# Patient Record
Sex: Male | Born: 1955
Health system: Southern US, Community
[De-identification: ages and names within clinical notes are randomized; demographics above are authoritative.]

## PROBLEM LIST (undated history)

## (undated) DIAGNOSIS — J984 Other disorders of lung: Secondary | ICD-10-CM

## (undated) DIAGNOSIS — E119 Type 2 diabetes mellitus without complications: Secondary | ICD-10-CM

## (undated) DIAGNOSIS — I10 Essential (primary) hypertension: Secondary | ICD-10-CM

## (undated) DIAGNOSIS — E663 Overweight: Secondary | ICD-10-CM

## (undated) DIAGNOSIS — R930 Abnormal findings on diagnostic imaging of skull and head, not elsewhere classified: Secondary | ICD-10-CM

## (undated) DIAGNOSIS — E785 Hyperlipidemia, unspecified: Secondary | ICD-10-CM

## (undated) DIAGNOSIS — L678 Other hair color and hair shaft abnormalities: Secondary | ICD-10-CM

## (undated) DIAGNOSIS — E291 Testicular hypofunction: Secondary | ICD-10-CM

## (undated) DIAGNOSIS — L738 Other specified follicular disorders: Secondary | ICD-10-CM

## (undated) DIAGNOSIS — S46219A Strain of muscle, fascia and tendon of other parts of biceps, unspecified arm, initial encounter: Secondary | ICD-10-CM

## (undated) DIAGNOSIS — F528 Other sexual dysfunction not due to a substance or known physiological condition: Secondary | ICD-10-CM

## (undated) HISTORY — DX: Abnormal findings on diagnostic imaging of skull and head, not elsewhere classified: R93.0

## (undated) HISTORY — DX: Type 2 diabetes mellitus without complications: E11.9

## (undated) HISTORY — DX: Strain of muscle, fascia and tendon of other parts of biceps, unspecified arm, initial encounter: S46.219A

## (undated) HISTORY — DX: Essential (primary) hypertension: I10

## (undated) HISTORY — PX: OTHER SURGICAL HISTORY: SHX169

## (undated) HISTORY — DX: Testicular hypofunction: E29.1

## (undated) HISTORY — DX: Other hair color and hair shaft abnormalities: L67.8

## (undated) HISTORY — DX: Other sexual dysfunction not due to a substance or known physiological condition: F52.8

## (undated) HISTORY — DX: Other specified follicular disorders: L73.8

## (undated) HISTORY — DX: Hyperlipidemia, unspecified: E78.5

## (undated) HISTORY — PX: WISDOM TOOTH EXTRACTION: SHX21

## (undated) HISTORY — DX: Other disorders of lung: J98.4

## (undated) HISTORY — PX: NASAL POLYP SURGERY: SHX186

## (undated) HISTORY — DX: Overweight: E66.3

---

## 1998-10-09 ENCOUNTER — Encounter: Admission: RE | Admit: 1998-10-09 | Discharge: 1999-01-07 | Payer: Self-pay | Admitting: Endocrinology

## 1999-01-19 ENCOUNTER — Encounter: Payer: Self-pay | Admitting: Internal Medicine

## 1999-01-19 ENCOUNTER — Ambulatory Visit (HOSPITAL_COMMUNITY): Admission: RE | Admit: 1999-01-19 | Discharge: 1999-01-19 | Payer: Self-pay | Admitting: Internal Medicine

## 2004-10-13 ENCOUNTER — Ambulatory Visit: Payer: Self-pay | Admitting: Internal Medicine

## 2005-04-12 ENCOUNTER — Ambulatory Visit: Payer: Self-pay | Admitting: Internal Medicine

## 2005-07-01 ENCOUNTER — Ambulatory Visit: Payer: Self-pay | Admitting: Internal Medicine

## 2005-09-23 ENCOUNTER — Ambulatory Visit: Payer: Self-pay

## 2005-10-01 ENCOUNTER — Ambulatory Visit: Payer: Self-pay | Admitting: Internal Medicine

## 2005-10-13 ENCOUNTER — Ambulatory Visit: Payer: Self-pay | Admitting: Internal Medicine

## 2006-01-03 ENCOUNTER — Ambulatory Visit: Payer: Self-pay | Admitting: Internal Medicine

## 2006-01-17 ENCOUNTER — Ambulatory Visit: Payer: Self-pay | Admitting: Internal Medicine

## 2006-02-04 ENCOUNTER — Ambulatory Visit: Payer: Self-pay | Admitting: Internal Medicine

## 2006-03-29 ENCOUNTER — Ambulatory Visit: Payer: Self-pay | Admitting: Internal Medicine

## 2006-04-19 ENCOUNTER — Ambulatory Visit: Payer: Self-pay | Admitting: Internal Medicine

## 2006-12-19 ENCOUNTER — Ambulatory Visit: Payer: Self-pay | Admitting: Internal Medicine

## 2006-12-19 LAB — CONVERTED CEMR LAB: Hgb A1c MFr Bld: 8.6 % — ABNORMAL HIGH (ref 4.6–6.0)

## 2006-12-28 ENCOUNTER — Ambulatory Visit: Payer: Self-pay | Admitting: Internal Medicine

## 2007-03-09 ENCOUNTER — Ambulatory Visit (HOSPITAL_COMMUNITY): Admission: RE | Admit: 2007-03-09 | Discharge: 2007-03-09 | Payer: Self-pay | Admitting: Ophthalmology

## 2007-03-30 ENCOUNTER — Ambulatory Visit: Payer: Self-pay | Admitting: Internal Medicine

## 2007-03-30 LAB — CONVERTED CEMR LAB: Hgb A1c MFr Bld: 8.4 % — ABNORMAL HIGH (ref 4.6–6.0)

## 2007-04-07 ENCOUNTER — Ambulatory Visit: Payer: Self-pay | Admitting: Internal Medicine

## 2007-05-11 ENCOUNTER — Ambulatory Visit: Payer: Self-pay | Admitting: Internal Medicine

## 2007-05-18 ENCOUNTER — Ambulatory Visit: Payer: Self-pay | Admitting: Internal Medicine

## 2007-08-21 ENCOUNTER — Ambulatory Visit: Payer: Self-pay | Admitting: Internal Medicine

## 2007-08-21 LAB — CONVERTED CEMR LAB
ALT: 41 units/L (ref 0–53)
AST: 41 units/L — ABNORMAL HIGH (ref 0–37)
Albumin: 4.1 g/dL (ref 3.5–5.2)
Alkaline Phosphatase: 50 units/L (ref 39–117)
BUN: 12 mg/dL (ref 6–23)
Basophils Absolute: 0 10*3/uL (ref 0.0–0.1)
Basophils Relative: 0.4 % (ref 0.0–1.0)
Bilirubin Urine: NEGATIVE
Bilirubin, Direct: 0.1 mg/dL (ref 0.0–0.3)
CO2: 29 meq/L (ref 19–32)
Calcium: 9.6 mg/dL (ref 8.4–10.5)
Chloride: 107 meq/L (ref 96–112)
Cholesterol: 135 mg/dL (ref 0–200)
Creatinine, Ser: 0.8 mg/dL (ref 0.4–1.5)
Eosinophils Absolute: 0.2 10*3/uL (ref 0.0–0.6)
Eosinophils Relative: 1.9 % (ref 0.0–5.0)
GFR calc Af Amer: 132 mL/min
GFR calc non Af Amer: 109 mL/min
Glucose, Bld: 95 mg/dL (ref 70–99)
HCT: 37.8 % — ABNORMAL LOW (ref 39.0–52.0)
HDL: 37.1 mg/dL — ABNORMAL LOW (ref >39.0)
Hemoglobin, Urine: NEGATIVE
Hemoglobin: 12 g/dL — ABNORMAL LOW (ref 13.0–17.0)
Hgb A1c MFr Bld: 6.9 % — ABNORMAL HIGH (ref 4.6–6.0)
Ketones, ur: NEGATIVE mg/dL
LDL Cholesterol: 91 mg/dL (ref 0–99)
Leukocytes, UA: NEGATIVE
Lymphocytes Relative: 39 % (ref 12.0–46.0)
MCHC: 31.7 g/dL (ref 30.0–36.0)
MCV: 74 fL — ABNORMAL LOW (ref 78.0–100.0)
Monocytes Absolute: 0.6 10*3/uL (ref 0.2–0.7)
Monocytes Relative: 7 % (ref 3.0–11.0)
Neutro Abs: 4 10*3/uL (ref 1.4–7.7)
Neutrophils Relative %: 51.7 % (ref 43.0–77.0)
Nitrite: NEGATIVE
PSA: 0.36 ng/mL (ref 0.10–4.00)
Platelets: 167 10*3/uL (ref 150–400)
Potassium: 4.1 meq/L (ref 3.5–5.1)
RBC: 5.11 M/uL (ref 4.22–5.81)
RDW: 14.6 % (ref 11.5–14.6)
Sodium: 141 meq/L (ref 135–145)
Specific Gravity, Urine: 1.015 (ref 1.000–1.03)
Total Bilirubin: 0.7 mg/dL (ref 0.3–1.2)
Total CHOL/HDL Ratio: 3.6
Total Protein, Urine: NEGATIVE mg/dL
Total Protein: 7.4 g/dL (ref 6.0–8.3)
Triglycerides: 34 mg/dL (ref 0–149)
Urine Glucose: NEGATIVE mg/dL
Urobilinogen, UA: 0.2 (ref 0.0–1.0)
VLDL: 7 mg/dL (ref 0–40)
WBC: 7.9 10*3/uL (ref 4.5–10.5)
pH: 6 (ref 5.0–8.0)

## 2007-08-29 ENCOUNTER — Ambulatory Visit: Payer: Self-pay | Admitting: Internal Medicine

## 2007-08-29 ENCOUNTER — Encounter: Payer: Self-pay | Admitting: Internal Medicine

## 2007-08-29 DIAGNOSIS — E1169 Type 2 diabetes mellitus with other specified complication: Secondary | ICD-10-CM | POA: Insufficient documentation

## 2007-08-29 DIAGNOSIS — I10 Essential (primary) hypertension: Secondary | ICD-10-CM | POA: Insufficient documentation

## 2007-08-29 DIAGNOSIS — L678 Other hair color and hair shaft abnormalities: Secondary | ICD-10-CM | POA: Insufficient documentation

## 2007-08-29 DIAGNOSIS — E119 Type 2 diabetes mellitus without complications: Secondary | ICD-10-CM | POA: Insufficient documentation

## 2007-08-29 DIAGNOSIS — N521 Erectile dysfunction due to diseases classified elsewhere: Secondary | ICD-10-CM

## 2007-08-29 DIAGNOSIS — L738 Other specified follicular disorders: Secondary | ICD-10-CM | POA: Insufficient documentation

## 2007-09-15 LAB — HM COLONOSCOPY: HM Colonoscopy: NORMAL

## 2007-10-10 ENCOUNTER — Ambulatory Visit: Payer: Self-pay | Admitting: Gastroenterology

## 2007-10-18 ENCOUNTER — Ambulatory Visit: Payer: Self-pay | Admitting: Gastroenterology

## 2007-11-27 ENCOUNTER — Encounter: Payer: Self-pay | Admitting: Internal Medicine

## 2007-11-30 DIAGNOSIS — R930 Abnormal findings on diagnostic imaging of skull and head, not elsewhere classified: Secondary | ICD-10-CM

## 2007-11-30 HISTORY — DX: Abnormal findings on diagnostic imaging of skull and head, not elsewhere classified: R93.0

## 2007-12-07 ENCOUNTER — Ambulatory Visit: Payer: Self-pay | Admitting: Internal Medicine

## 2007-12-11 ENCOUNTER — Encounter: Payer: Self-pay | Admitting: Internal Medicine

## 2008-07-03 ENCOUNTER — Telehealth: Payer: Self-pay | Admitting: Internal Medicine

## 2008-07-04 DIAGNOSIS — R930 Abnormal findings on diagnostic imaging of skull and head, not elsewhere classified: Secondary | ICD-10-CM | POA: Insufficient documentation

## 2008-07-05 ENCOUNTER — Telehealth: Payer: Self-pay | Admitting: Internal Medicine

## 2008-07-22 ENCOUNTER — Ambulatory Visit: Payer: Self-pay | Admitting: Internal Medicine

## 2008-07-23 ENCOUNTER — Encounter: Payer: Self-pay | Admitting: Internal Medicine

## 2008-08-23 ENCOUNTER — Ambulatory Visit: Payer: Self-pay | Admitting: Internal Medicine

## 2008-08-23 LAB — CONVERTED CEMR LAB
ALT: 57 units/L — ABNORMAL HIGH (ref 0–53)
AST: 43 units/L — ABNORMAL HIGH (ref 0–37)
Albumin: 4.1 g/dL (ref 3.5–5.2)
Alkaline Phosphatase: 50 units/L (ref 39–117)
BUN: 11 mg/dL (ref 6–23)
Basophils Absolute: 0 10*3/uL (ref 0.0–0.1)
Basophils Relative: 0.2 % (ref 0.0–3.0)
Bilirubin Urine: NEGATIVE
Bilirubin, Direct: 0.2 mg/dL (ref 0.0–0.3)
CO2: 28 meq/L (ref 19–32)
Calcium: 9.4 mg/dL (ref 8.4–10.5)
Chloride: 109 meq/L (ref 96–112)
Cholesterol: 140 mg/dL (ref 0–200)
Creatinine, Ser: 1 mg/dL (ref 0.4–1.5)
Eosinophils Absolute: 0.2 10*3/uL (ref 0.0–0.7)
Eosinophils Relative: 2.7 % (ref 0.0–5.0)
GFR calc Af Amer: 101 mL/min
GFR calc non Af Amer: 84 mL/min
Glucose, Bld: 114 mg/dL — ABNORMAL HIGH (ref 70–99)
HCT: 39.1 % (ref 39.0–52.0)
HDL: 33.4 mg/dL — ABNORMAL LOW (ref >39.0)
Hemoglobin, Urine: NEGATIVE
Hemoglobin: 12.5 g/dL — ABNORMAL LOW (ref 13.0–17.0)
Ketones, ur: NEGATIVE mg/dL
LDL Cholesterol: 92 mg/dL (ref 0–99)
Leukocytes, UA: NEGATIVE
Lymphocytes Relative: 39.4 % (ref 12.0–46.0)
MCHC: 31.9 g/dL (ref 30.0–36.0)
MCV: 74.5 fL — ABNORMAL LOW (ref 78.0–100.0)
Monocytes Absolute: 0.6 10*3/uL (ref 0.1–1.0)
Monocytes Relative: 7.4 % (ref 3.0–12.0)
Neutro Abs: 4 10*3/uL (ref 1.4–7.7)
Neutrophils Relative %: 50.3 % (ref 43.0–77.0)
Nitrite: NEGATIVE
PSA: 0.32 ng/mL (ref 0.10–4.00)
Platelets: 194 10*3/uL (ref 150–400)
Potassium: 4.2 meq/L (ref 3.5–5.1)
RBC: 5.25 M/uL (ref 4.22–5.81)
RDW: 14.5 % (ref 11.5–14.6)
Sodium: 142 meq/L (ref 135–145)
Specific Gravity, Urine: >=1.03 (ref 1.000–1.03)
TSH: 1.48 microintl units/mL (ref 0.35–5.50)
Total Bilirubin: 0.7 mg/dL (ref 0.3–1.2)
Total CHOL/HDL Ratio: 4.2
Total Protein, Urine: NEGATIVE mg/dL
Total Protein: 7.4 g/dL (ref 6.0–8.3)
Triglycerides: 72 mg/dL (ref 0–149)
Urine Glucose: NEGATIVE mg/dL
Urobilinogen, UA: 0.2 (ref 0.0–1.0)
VLDL: 14 mg/dL (ref 0–40)
WBC: 7.9 10*3/uL (ref 4.5–10.5)
pH: 5.5 (ref 5.0–8.0)

## 2008-09-26 ENCOUNTER — Ambulatory Visit: Payer: Self-pay | Admitting: Internal Medicine

## 2008-09-26 DIAGNOSIS — E66811 Obesity, class 1: Secondary | ICD-10-CM | POA: Insufficient documentation

## 2008-09-26 DIAGNOSIS — E669 Obesity, unspecified: Secondary | ICD-10-CM | POA: Insufficient documentation

## 2008-09-26 LAB — CONVERTED CEMR LAB: Hgb A1c MFr Bld: 7.5 % — ABNORMAL HIGH (ref 4.6–6.0)

## 2008-09-29 ENCOUNTER — Encounter: Payer: Self-pay | Admitting: Internal Medicine

## 2008-11-29 HISTORY — PX: COLONOSCOPY: SHX174

## 2008-12-19 ENCOUNTER — Ambulatory Visit: Payer: Self-pay | Admitting: Gastroenterology

## 2008-12-27 ENCOUNTER — Encounter: Payer: Self-pay | Admitting: Internal Medicine

## 2008-12-27 ENCOUNTER — Ambulatory Visit: Payer: Self-pay | Admitting: Gastroenterology

## 2008-12-27 ENCOUNTER — Encounter: Payer: Self-pay | Admitting: Gastroenterology

## 2008-12-31 ENCOUNTER — Encounter: Payer: Self-pay | Admitting: Gastroenterology

## 2009-01-06 ENCOUNTER — Encounter: Payer: Self-pay | Admitting: Internal Medicine

## 2009-02-05 ENCOUNTER — Telehealth: Payer: Self-pay | Admitting: Internal Medicine

## 2009-02-05 ENCOUNTER — Encounter: Payer: Self-pay | Admitting: Internal Medicine

## 2009-02-07 ENCOUNTER — Telehealth (INDEPENDENT_AMBULATORY_CARE_PROVIDER_SITE_OTHER): Payer: Self-pay | Admitting: *Deleted

## 2009-02-10 ENCOUNTER — Ambulatory Visit: Payer: Self-pay | Admitting: Internal Medicine

## 2009-02-10 DIAGNOSIS — J984 Other disorders of lung: Secondary | ICD-10-CM | POA: Insufficient documentation

## 2009-02-10 HISTORY — DX: Other disorders of lung: J98.4

## 2009-02-14 ENCOUNTER — Encounter: Payer: Self-pay | Admitting: Internal Medicine

## 2009-02-14 ENCOUNTER — Ambulatory Visit: Payer: Self-pay | Admitting: Internal Medicine

## 2009-02-14 DIAGNOSIS — S6710XA Crushing injury of unspecified finger(s), initial encounter: Secondary | ICD-10-CM | POA: Insufficient documentation

## 2009-02-14 DIAGNOSIS — S6000XA Contusion of unspecified finger without damage to nail, initial encounter: Secondary | ICD-10-CM | POA: Insufficient documentation

## 2009-02-17 ENCOUNTER — Ambulatory Visit: Payer: Self-pay | Admitting: Cardiology

## 2009-03-04 ENCOUNTER — Telehealth: Payer: Self-pay | Admitting: Internal Medicine

## 2009-03-11 ENCOUNTER — Telehealth: Payer: Self-pay | Admitting: Internal Medicine

## 2009-05-09 ENCOUNTER — Telehealth: Payer: Self-pay | Admitting: Internal Medicine

## 2009-05-12 ENCOUNTER — Ambulatory Visit: Payer: Self-pay | Admitting: Internal Medicine

## 2009-05-13 ENCOUNTER — Telehealth: Payer: Self-pay | Admitting: Internal Medicine

## 2009-05-13 LAB — CONVERTED CEMR LAB
BUN: 12 mg/dL (ref 6–23)
CO2: 27 meq/L (ref 19–32)
Calcium: 9.3 mg/dL (ref 8.4–10.5)
Chloride: 103 meq/L (ref 96–112)
Creatinine, Ser: 1 mg/dL (ref 0.4–1.5)
GFR calc non Af Amer: 100.86 mL/min (ref >60)
Glucose, Bld: 187 mg/dL — ABNORMAL HIGH (ref 70–99)
Hgb A1c MFr Bld: 8.9 % — ABNORMAL HIGH (ref 4.6–6.5)
Potassium: 4 meq/L (ref 3.5–5.1)
Sodium: 137 meq/L (ref 135–145)

## 2009-06-18 ENCOUNTER — Telehealth: Payer: Self-pay | Admitting: Internal Medicine

## 2009-07-24 ENCOUNTER — Ambulatory Visit: Payer: Self-pay | Admitting: Internal Medicine

## 2009-07-24 DIAGNOSIS — R5383 Other fatigue: Secondary | ICD-10-CM

## 2009-07-24 DIAGNOSIS — R5381 Other malaise: Secondary | ICD-10-CM | POA: Insufficient documentation

## 2009-07-24 LAB — CONVERTED CEMR LAB: Blood Glucose, Fingerstick: 202

## 2009-07-25 ENCOUNTER — Encounter (INDEPENDENT_AMBULATORY_CARE_PROVIDER_SITE_OTHER): Payer: Self-pay | Admitting: *Deleted

## 2009-07-25 LAB — CONVERTED CEMR LAB
BUN: 12 mg/dL (ref 6–23)
Basophils Absolute: 0 10*3/uL (ref 0.0–0.1)
Basophils Relative: 0 % (ref 0.0–3.0)
Bilirubin Urine: NEGATIVE
CO2: 31 meq/L (ref 19–32)
Calcium: 9.4 mg/dL (ref 8.4–10.5)
Chloride: 107 meq/L (ref 96–112)
Creatinine, Ser: 0.9 mg/dL (ref 0.4–1.5)
Eosinophils Absolute: 0.2 10*3/uL (ref 0.0–0.7)
Eosinophils Relative: 3.3 % (ref 0.0–5.0)
GFR calc non Af Amer: 113.81 mL/min (ref >60)
Glucose, Bld: 141 mg/dL — ABNORMAL HIGH (ref 70–99)
HCT: 37.9 % — ABNORMAL LOW (ref 39.0–52.0)
Hemoglobin, Urine: NEGATIVE
Hemoglobin: 12.5 g/dL — ABNORMAL LOW (ref 13.0–17.0)
Hgb A1c MFr Bld: 8.5 % — ABNORMAL HIGH (ref 4.6–6.5)
Ketones, ur: NEGATIVE mg/dL
Leukocytes, UA: NEGATIVE
Lymphocytes Relative: 38.7 % (ref 12.0–46.0)
Lymphs Abs: 2.7 10*3/uL (ref 0.7–4.0)
MCHC: 32.9 g/dL (ref 30.0–36.0)
MCV: 74.4 fL — ABNORMAL LOW (ref 78.0–100.0)
Monocytes Absolute: 0.6 10*3/uL (ref 0.1–1.0)
Monocytes Relative: 8.4 % (ref 3.0–12.0)
Neutro Abs: 3.6 10*3/uL (ref 1.4–7.7)
Neutrophils Relative %: 49.6 % (ref 43.0–77.0)
Nitrite: NEGATIVE
Platelets: 167 10*3/uL (ref 150.0–400.0)
Potassium: 4.4 meq/L (ref 3.5–5.1)
RBC: 5.09 M/uL (ref 4.22–5.81)
RDW: 14.1 % (ref 11.5–14.6)
Sodium: 142 meq/L (ref 135–145)
Specific Gravity, Urine: 1.025 (ref 1.000–1.030)
TSH: 1.17 microintl units/mL (ref 0.35–5.50)
Total Protein, Urine: NEGATIVE mg/dL
Urine Glucose: NEGATIVE mg/dL
Urobilinogen, UA: 0.2 (ref 0.0–1.0)
WBC: 7.1 10*3/uL (ref 4.5–10.5)
pH: 6 (ref 5.0–8.0)

## 2009-09-23 ENCOUNTER — Ambulatory Visit: Payer: Self-pay | Admitting: Internal Medicine

## 2009-09-23 LAB — CONVERTED CEMR LAB
ALT: 43 units/L (ref 0–53)
AST: 35 units/L (ref 0–37)
Albumin: 4 g/dL (ref 3.5–5.2)
Alkaline Phosphatase: 49 units/L (ref 39–117)
BUN: 12 mg/dL (ref 6–23)
Basophils Absolute: 0 10*3/uL (ref 0.0–0.1)
Basophils Relative: 0.3 % (ref 0.0–3.0)
Bilirubin Urine: NEGATIVE
Bilirubin, Direct: 0.1 mg/dL (ref 0.0–0.3)
CO2: 28 meq/L (ref 19–32)
Calcium: 9.1 mg/dL (ref 8.4–10.5)
Chloride: 106 meq/L (ref 96–112)
Cholesterol: 138 mg/dL (ref 0–200)
Creatinine, Ser: 0.8 mg/dL (ref 0.4–1.5)
Eosinophils Absolute: 0.2 10*3/uL (ref 0.0–0.7)
Eosinophils Relative: 3.8 % (ref 0.0–5.0)
GFR calc non Af Amer: 130.3 mL/min (ref >60)
Glucose, Bld: 144 mg/dL — ABNORMAL HIGH (ref 70–99)
HCT: 39.8 % (ref 39.0–52.0)
HDL: 32.5 mg/dL — ABNORMAL LOW (ref >39.00)
Hemoglobin, Urine: NEGATIVE
Hemoglobin: 12.9 g/dL — ABNORMAL LOW (ref 13.0–17.0)
Ketones, ur: NEGATIVE mg/dL
LDL Cholesterol: 96 mg/dL (ref 0–99)
Leukocytes, UA: NEGATIVE
Lymphocytes Relative: 47.1 % — ABNORMAL HIGH (ref 12.0–46.0)
Lymphs Abs: 3 10*3/uL (ref 0.7–4.0)
MCHC: 32.5 g/dL (ref 30.0–36.0)
MCV: 76.5 fL — ABNORMAL LOW (ref 78.0–100.0)
Monocytes Absolute: 0.5 10*3/uL (ref 0.1–1.0)
Monocytes Relative: 8.4 % (ref 3.0–12.0)
Neutro Abs: 2.5 10*3/uL (ref 1.4–7.7)
Neutrophils Relative %: 40.4 % — ABNORMAL LOW (ref 43.0–77.0)
Nitrite: NEGATIVE
PSA: 0.39 ng/mL (ref 0.10–4.00)
Platelets: 162 10*3/uL (ref 150.0–400.0)
Potassium: 4.3 meq/L (ref 3.5–5.1)
RBC: 5.2 M/uL (ref 4.22–5.81)
RDW: 13.8 % (ref 11.5–14.6)
Sodium: 140 meq/L (ref 135–145)
Specific Gravity, Urine: >=1.03 (ref 1.000–1.030)
TSH: 1.54 microintl units/mL (ref 0.35–5.50)
Total Bilirubin: 0.5 mg/dL (ref 0.3–1.2)
Total CHOL/HDL Ratio: 4
Total Protein, Urine: NEGATIVE mg/dL
Total Protein: 7.4 g/dL (ref 6.0–8.3)
Triglycerides: 50 mg/dL (ref 0.0–149.0)
Urine Glucose: NEGATIVE mg/dL
Urobilinogen, UA: 0.2 (ref 0.0–1.0)
VLDL: 10 mg/dL (ref 0.0–40.0)
WBC: 6.2 10*3/uL (ref 4.5–10.5)
pH: 5.5 (ref 5.0–8.0)

## 2009-09-29 ENCOUNTER — Ambulatory Visit: Payer: Self-pay | Admitting: Internal Medicine

## 2009-09-29 LAB — HM DIABETES FOOT EXAM

## 2009-12-16 ENCOUNTER — Ambulatory Visit: Payer: Self-pay | Admitting: Internal Medicine

## 2009-12-16 LAB — CONVERTED CEMR LAB: Hgb A1c MFr Bld: 8.3 % — ABNORMAL HIGH (ref 4.6–6.5)

## 2009-12-18 ENCOUNTER — Telehealth (INDEPENDENT_AMBULATORY_CARE_PROVIDER_SITE_OTHER): Payer: Self-pay | Admitting: *Deleted

## 2009-12-24 ENCOUNTER — Ambulatory Visit: Payer: Self-pay | Admitting: Internal Medicine

## 2010-01-26 ENCOUNTER — Telehealth: Payer: Self-pay | Admitting: Internal Medicine

## 2010-04-29 ENCOUNTER — Ambulatory Visit: Payer: Self-pay | Admitting: Internal Medicine

## 2010-05-08 ENCOUNTER — Telehealth: Payer: Self-pay | Admitting: Internal Medicine

## 2010-05-08 LAB — CONVERTED CEMR LAB: Hgb A1c MFr Bld: 6.7 % — ABNORMAL HIGH (ref 4.6–6.5)

## 2010-08-10 ENCOUNTER — Telehealth: Payer: Self-pay | Admitting: Internal Medicine

## 2010-08-17 ENCOUNTER — Ambulatory Visit: Payer: Self-pay | Admitting: Internal Medicine

## 2010-08-17 LAB — CONVERTED CEMR LAB: Hgb A1c MFr Bld: 7.2 % — ABNORMAL HIGH (ref 4.6–6.5)

## 2010-08-20 ENCOUNTER — Telehealth (INDEPENDENT_AMBULATORY_CARE_PROVIDER_SITE_OTHER): Payer: Self-pay | Admitting: *Deleted

## 2010-08-24 ENCOUNTER — Telehealth: Payer: Self-pay | Admitting: Internal Medicine

## 2010-10-09 ENCOUNTER — Ambulatory Visit: Payer: Self-pay | Admitting: Internal Medicine

## 2010-10-09 ENCOUNTER — Encounter: Payer: Self-pay | Admitting: Internal Medicine

## 2010-12-26 ENCOUNTER — Ambulatory Visit
Admission: RE | Admit: 2010-12-26 | Discharge: 2010-12-26 | Payer: Self-pay | Source: Home / Self Care | Attending: Family Medicine | Admitting: Family Medicine

## 2010-12-26 LAB — CONVERTED CEMR LAB
Bilirubin Urine: NEGATIVE
Blood in Urine, dipstick: NEGATIVE
Glucose, Urine, Semiquant: NEGATIVE
Ketones, urine, test strip: NEGATIVE
Nitrite: NEGATIVE
Protein, U semiquant: NEGATIVE
Specific Gravity, Urine: 1.01
Urobilinogen, UA: NEGATIVE
WBC Urine, dipstick: NEGATIVE
pH: 5

## 2010-12-29 ENCOUNTER — Ambulatory Visit
Admission: RE | Admit: 2010-12-29 | Discharge: 2010-12-29 | Payer: Self-pay | Source: Home / Self Care | Attending: Internal Medicine | Admitting: Internal Medicine

## 2010-12-29 ENCOUNTER — Telehealth: Payer: Self-pay | Admitting: Internal Medicine

## 2010-12-29 ENCOUNTER — Encounter: Payer: Self-pay | Admitting: Internal Medicine

## 2010-12-29 ENCOUNTER — Other Ambulatory Visit: Payer: Self-pay | Admitting: Internal Medicine

## 2010-12-29 LAB — HEMOGLOBIN A1C: Hgb A1c MFr Bld: 7.6 % — ABNORMAL HIGH (ref 4.6–6.5)

## 2010-12-29 LAB — TESTOSTERONE: Testosterone: 143.89 ng/dL — ABNORMAL LOW (ref 350.00–890.00)

## 2010-12-31 ENCOUNTER — Telehealth: Payer: Self-pay | Admitting: Internal Medicine

## 2010-12-31 NOTE — Assessment & Plan Note (Signed)
Summary: test results per pt/$50/cd   Vital Signs:  Patient profile:   55 year old male Height:      75 inches Weight:      285 pounds BMI:     35.75 Temp:     97.9 degrees F oral Pulse rate:   76 / minute BP sitting:   122 / 84  (left arm) Cuff size:   large  Vitals Entered By: Zackery Barefoot CMA (February 10, 2009 2:20 PM) CC: follow up on outside CT scan   Primary Care Provider:  [facility]ins  CC:  follow up on outside CT scan.  History of Present Illness: Patient has been participating in a research program looking at coronary disease in diabetics. As part of his evaluation he had a cardiac CT which reveals a suspicious lesion in the RUL. 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 He is asymptomatic without weight loss, cough, shortness of breath, night sweats or any other systemic symptoms.  Current Medications (verified): 1)  Glucophage 1000 Mg  Tabs (Metformin Hcl) .... Two Times A Day 2)  Enalapril Maleate 10 Mg  Tabs (Enalapril Maleate) .... Once Daily 3)  Duac Cs 1-5 %  Kit (Clindamycin-Benzoyl Per-Cleans) .... Apply Two Times A Day As Needed 4)  Testim 1 %  Gel (Testosterone) .... Apply 5 Grams Once Daily As Needed  Allergies (verified): No Known Drug Allergies  Past History:  Past Medical History:    UCD    pseudofolliculitis barbae    biceps tendon rupture    Diabetes mellitus, type II    Hypertension     (08/29/2007)  Past Surgical History:    nasal polypectomy (08/29/2007)  Family History:    father- deceased @38 : Kidney failure    mother- living 43: s/p THR, TKR, DM HTN,    negative -CAD; prostate or colon cancer;    Brother - pancreatic mass. (09/26/2008)  Risk Factors:    Smoking Status: quit (08/29/2007)    Packs/Day: N/A    Cigars/wk: N/A    Pipe Use/wk: N/A    Cans of tobacco/wk: N/A    Passive Smoke Exposure: N/A  Review of Systems  The patient denies anorexia,  fever, weight loss, hoarseness, dyspnea on exertion, prolonged cough, and hemoptysis.    Physical Exam  General:  Heavy set large framed AA male in NAD Lungs:  Normal respiratory effort, chest expands symmetrically. Lungs are clear to auscultation, no crackles or wheezes. Heart:  normal rate and regular rhythm.     Impression & Recommendations:  Problem # 1:  PULMONARY NODULE, RIGHT UPPER LOBE (ICD-518.89) CT report from [facility] with a 1.5 x 1.2 x 0.9 cm nodule in the RUL  Plan: dedicated CT chest with contrast.  Additional incidental finding was a hypodense lesion in the Sacral spine -  the patient with no symptoms. Will consider imaging if CT chest is normal.  Complete Medication List: 1)  Glucophage 1000 Mg Tabs (Metformin hcl) .... Two times a day 2)  Enalapril Maleate 10 Mg Tabs (Enalapril maleate) .... Once daily 3)  Duac Cs 1-5 % Kit (Clindamycin-benzoyl per-cleans) .... Apply two times a day as needed 4)  Testim 1 % Gel (Testosterone) .... Apply 5 grams once daily as needed  Other Orders: Radiology Referral (Radiology)

## 2010-12-31 NOTE — Progress Notes (Signed)
Summary: Fingernail  Phone Note Call from Patient   Summary of Call: Pt was seen by Dr Yetta Barre for infected fingernail. Pt says that 1/2 of the infected nail has began to fall off. Per office notes, Dr did not say nail was infected, only injured. Patient is requesting apt monday with someone to have nail removed. Monday is pt's only day off. Is this something we can do in office or does pt need referral.  Initial call taken by: Lamar Sprinkles,  May 09, 2009 11:58 AM  Follow-up for Phone Call        I can do itin the office.work him where we can Follow-up by: Jacques Navy MD,  May 09, 2009 4:17 PM  Additional Follow-up for Phone Call Additional follow up Details #1::        Pt scheduled for wk in apt monday 11:30 Additional Follow-up by: Lamar Sprinkles,  May 09, 2009 6:15 PM

## 2010-12-31 NOTE — Progress Notes (Signed)
Summary: results  Phone Note Outgoing Call   Reason for Call: Discuss lab or test results Summary of Call: Please call patient: CT chest without suspicious nodule or any worrisome findings.  thanks Initial call taken by: Jacques Navy MD,  March 04, 2009 7:46 AM  Follow-up for Phone Call        Patient lmovm wating to know test results Follow-up by: Rock Nephew CMA,  March 04, 2009 1:56 PM  Additional Follow-up for Phone Call Additional follow up Details #1::        Pt advised of above, also asked if okay to resume Metformin due to the dye (02/17/2009), advised pt same was okay. Pt asked if MD would be sending out a letter, advised the pt that no letter is availabe and if he wanted one, pt stated no. Additional Follow-up by: Zackery Barefoot CMA,  March 06, 2009 10:17 AM

## 2010-12-31 NOTE — Progress Notes (Signed)
Summary: labs  Phone Note Outgoing Call   Reason for Call: Discuss lab or test results Summary of Call: please call patinet: A1C 7.2%  Good work. Initial call taken by: Jacques Navy MD,  August 24, 2010 8:55 AM  Follow-up for Phone Call        Notified pt with results. pt also states pharmacy never recived testoterone rx. Per chart rx sent in 08/20/10 will resend to [facility] Follow-up by: Orlan Leavens RMA,  August 24, 2010 11:50 AM    Prescriptions: TESTIM 1 %  GEL (TESTOSTERONE) APPLY 5 GRAMS once daily as needed  #1 month supp x 0   Entered by:   Orlan Leavens RMA   Authorized by:   Jacques Navy MD   Signed by:   Orlan Leavens RMA on 08/24/2010   Method used:   Faxed to ...       [facility] [id number] (retail)       [address] [place], [place]  [address]       Ph: [phone]       Fax: [phone]   RxID:   [id number]

## 2010-12-31 NOTE — Progress Notes (Signed)
Summary: RESULTS  Phone Note Call from Patient Call back at [phone]   Summary of Call: Patient is requesting results of labs.  Initial call taken by: Lamar Sprinkles, CMA,  May 08, 2010 5:09 PM  Follow-up for Phone Call        called patient with results Follow-up by: Jacques Navy MD,  May 08, 2010 6:54 PM

## 2010-12-31 NOTE — Letter (Signed)
Summary: Out of Work  [facility]  [address] [place], [place] [address]   Phone: [phone]  Fax: [phone]    February 14, 2009   Employee:  [name]    To Whom It May Concern:   For Medical reasons, please excuse the above named employee from work for the following dates:  Start:   February 14, 2009  End:   February 17, 2009  If you need additional information, please feel free to contact our office.         Sincerely,    Etta Grandchild MD

## 2010-12-31 NOTE — Assessment & Plan Note (Signed)
Summary: finger injury SD   Vital Signs:  Patient profile:   55 year old male Height:      75 inches Weight:      284 pounds Temp:     97.7 degrees F Pulse rate:   60 / minute Pulse rhythm:   regular BP sitting:   118 / 74  (left arm) Cuff size:   large  Vitals Entered By: Rock Nephew CMA (February 14, 2009 1:20 PM)   Primary Care Provider:  [facility]   History of Present Illness: I am seeing this pt. for the first time today. He complains of crusing his left thumb in a car door one day PTA. He has gotten pain relief with meds he had  at home. There is a very small collection of blood under the nail proximally but it does not feel painful or swollen today. He wants to make sure it is okay and he needs a note for missing work.  Current Medications (verified): 1)  Glucophage 1000 Mg  Tabs (Metformin Hcl) .... Two Times A Day 2)  Enalapril Maleate 10 Mg  Tabs (Enalapril Maleate) .... Once Daily 3)  Duac Cs 1-5 %  Kit (Clindamycin-Benzoyl Per-Cleans) .... Apply Two Times A Day As Needed 4)  Testim 1 %  Gel (Testosterone) .... Apply 5 Grams Once Daily As Needed  Allergies (verified): No Known Drug Allergies  Past History:  Past Medical History:    Reviewed history from 08/29/2007 and no changes required:    UCD    pseudofolliculitis barbae    biceps tendon rupture    Diabetes mellitus, type II    Hypertension  Past Surgical History:    Reviewed history from 08/29/2007 and no changes required:    nasal polypectomy  Family History:    Reviewed history from 09/26/2008 and no changes required:       father- deceased @38 : Kidney failure       mother- living 42: s/p THR, TKR, DM HTN,       negative -CAD; prostate or colon cancer;       Brother - pancreatic mass.  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       married 7 yrs, single  7 years, married 13 years (1995)       2 daughters-  '80, '50       2 grandchildren - '99, '06  Physical Exam  General:  alert, well-developed, well-nourished, well-hydrated, and overweight-appearing.   Neck:  supple, full ROM, and no masses.   Lungs:  Normal respiratory effort, chest expands symmetrically. Lungs are clear to auscultation, no crackles or wheezes. Heart:  Normal rate and regular rhythm. S1 and S2 normal without gallop, murmur, click, rub or other extra sounds. Abdomen:  soft, non-tender, and normal bowel sounds.   Msk:  left thumb appears normal with the exception of a very small, faint subungal hematoma proxiamlly. The fingernail is intact and there is no swelling, ttp, wounds, and there is good cap. refill distally. Pulses:  R and L carotid,radial,femoral,dorsalis pedis and posterior tibial pulses are full and equal bilaterally Skin:  turgor normal and color normal.   Psych:  Cognition and judgment appear intact. Alert and cooperative with normal attention span and concentration. No apparent delusions, illusions, hallucinations   Impression & Recommendations:  Problem # 1:  SUBUNGUAL HEMATOMA (ICD-923.3) Assessment New  Problem #  2:  CRUSHING INJURY OF FINGER (ICD-927.3) Assessment: New  Orders: T-Finger(s) (73140TC)  Complete Medication List: 1)  Glucophage 1000 Mg Tabs (Metformin hcl) .... Two times a day 2)  Enalapril Maleate 10 Mg Tabs (Enalapril maleate) .... Once daily 3)  Duac Cs 1-5 % Kit (Clindamycin-benzoyl per-cleans) .... Apply two times a day as needed 4)  Testim 1 % Gel (Testosterone) .... Apply 5 grams once daily as needed  Other Orders: Tdap => 83yrs IM (04540) Admin 1st Vaccine (98119)  Patient Instructions: 1)  rest, ice and elevate the left thumb 2)  Please schedule a follow-up appointment in 2 weeks. 3)  Take 650-1000mg  of Tylenol every 4-6 hours as needed for relief of pain or comfort of fever AVOID taking more than 4000mg   in a 24 hour period (can cause liver damage in higher doses). 4)   Take 400-600mg  of Ibuprofen (Advil, Motrin) with food every 4-6 hours as needed for relief of pain or comfort of fever.   Tetanus/Td Vaccine    Vaccine Type: Tdap    Site: left deltoid    Mfr: GlaxoSmithKline    Dose: 0.5 ml    Route: IM    Given by: Rock Nephew CMA    Exp. Date: 01/22/2011    Lot #: [id number]    VIS given: 10/17/07 version given February 14, 2009.

## 2010-12-31 NOTE — Assessment & Plan Note (Signed)
Summary: PHYSICAL--$50-STC   Vital Signs:  Patient Profile:   55 Years Old Male Height:     75 inches Weight:      290 pounds BMI:     36.38 Temp:     98.8 degrees F oral Pulse rate:   60 / minute BP sitting:   140 / 80  (left arm) Cuff size:   large  Vitals Entered By: Zackery Barefoot CMA (September 26, 2008 2:34 PM)                 PCP:  Marlene Beidler  Chief Complaint:  CPX.  History of Present Illness: Presents today for annual physical exam. Stress - general things. but he is employed. Family is healthy.     Prior Medications Reviewed Using: Patient Recall  Updated Prior Medication List: GLUCOPHAGE 1000 MG  TABS (METFORMIN HCL) two times a day ENALAPRIL MALEATE 10 MG  TABS (ENALAPRIL MALEATE) once daily DUAC CS 1-5 %  KIT (CLINDAMYCIN-BENZOYL PER-CLEANS) apply two times a day as needed TESTIM 1 %  GEL (TESTOSTERONE) APPLY 5 GRAMS once daily as needed  Current Allergies: No known allergies   Past Medical History:    Reviewed history from 08/29/2007 and no changes required:       UCD       pseudofolliculitis barbae       biceps tendon rupture       Diabetes mellitus, type II       Hypertension  Past Surgical History:    Reviewed history from 08/29/2007 and no changes required:       nasal polypectomy   Family History:    father- deceased @38 : Kidney failure    mother- living 60: s/p THR, TKR, DM HTN,    negative -CAD; prostate or colon cancer;    Brother - pancreatic mass.  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    married 7 yrs, single  7 years, married 13 years (1995)    2 daughters- '80, '72    2 grandchildren - '99, '06   Risk Factors: Tobacco use:  quit    Year quit:  1992 Alcohol use:  no Exercise:  yes    Times per week:  5    Type:  cardio-fitness Seatbelt use:  100 %  Colonoscopy History:    Date of Last Colonoscopy:  09/15/2007   Review of Systems  The patient denies anorexia, fever, weight loss,  weight gain, vision loss, chest pain, syncope, peripheral edema, headaches, abdominal pain, hematochezia, muscle weakness, difficulty walking, and angioedema.     Physical Exam  General:     Well-developed,well-nourished,in no acute distress; alert,appropriate and cooperative throughout examination Head:     normocephalic, atraumatic, and no abnormalities observed.   Eyes:     vision grossly intact, pupils equal, pupils round, corneas and lenses clear, no injection, no optic disk abnormalities, and no retinal abnormalitiies.   Ears:     R ear normal, L ear normal, and no external deformities.   Nose:     no external deformity.   Mouth:     Oral mucosa and oropharynx without lesions or exudates.  Teeth in good repair. Neck:     supple, full ROM, and no thyromegaly.   Chest Wall:     no deformities.   Breasts:     gynecomastia.   Lungs:     Normal respiratory effort, chest expands symmetrically. Lungs are clear to auscultation, no crackles  or wheezes. Heart:     Normal rate and regular rhythm. S1 and S2 normal without gallop, murmur, click, rub or other extra sounds. Abdomen:     soft, non-tender, normal bowel sounds, no guarding, no hepatomegaly, and no splenomegaly.   Prostate:     patient deferred -had PSA Msk:     normal ROM, no joint tenderness, no joint swelling, no joint warmth, and no redness over joints.   Pulses:     2+ radial, DP Extremities:     No clubbing, cyanosis, edema, or deformity noted with normal full range of motion of all joints.   Neurologic:     alert & oriented X3, cranial nerves II-XII intact, strength normal in all extremities, gait normal, and DTRs symmetrical and normal.   Skin:     Intact without suspicious lesions or rashes Cervical Nodes:     No lymphadenopathy noted Psych:     Oriented X3, memory intact for recent and remote, normally interactive, good eye contact, and not anxious appearing.      Impression & Recommendations:  Problem  # 1:  HYPERTENSION (ICD-401.9)  His updated medication list for this problem includes:    Enalapril Maleate 10 Mg Tabs (Enalapril maleate) ..... Once daily  BP today: 140/80 Prior BP: 148/92 (08/29/2007)  Labs Reviewed: Creat: 1.0 (08/23/2008) Chol: 140 (08/23/2008)   HDL: 33.4 (08/23/2008)   LDL: 92 (08/23/2008)   TG: 72 (08/23/2008)  Adequate control. Plan- continue present medications   Problem # 2:  DIABETES MELLITUS, TYPE II (ICD-250.00)  His updated medication list for this problem includes:    Glucophage 1000 Mg Tabs (Metformin hcl) .Marland Kitchen..Marland Kitchen Two times a day    Enalapril Maleate 10 Mg Tabs (Enalapril maleate) ..... Once daily  Orders: TLB-A1C / Hgb A1C (Glycohemoglobin) (83036-A1C)    7.5%  Suboptimal control but not at a level to change medications. discussed with the patient the need for a better diet regimen including reducing the amount of carbs in his diet.  Orders: TLB-A1C / Hgb A1C (Glycohemoglobin) (83036-A1C)   Problem # 3:  OVERWEIGHT (ICD-278.02) Patient is significantly above his ideal body weight. Discussed the importance of weight management in regard to his other medical problems. Reviewed a weight management strategy of smart food choices, portion size control and continued exercise. Strongly suggested that he do a calorie count.   Plan:  Target weight 240 lbs, goal is to loose 1 lb/month.  Problem # 4:  Preventive Health Care (ICD-V70.0) Normal exam. Labs look good. Cholesterol levels are at goal.   Patient is due for colonoscopy: there was a communication problem last year. He was scheduled for colonoscopy Wednsday Nov 19th, '08 but due to inability to schedule transportation, with his wife not being able to get off work, he reports calling GI in an effort to cancel his procedure. However, there was a lack of communication of some type, no documentation located, and he was charged a $100 no show fee which he has not paid. I agreed to write on his behalf so  that he can be rescheduled.  Complete Medication List: 1)  Glucophage 1000 Mg Tabs (Metformin hcl) .... Two times a day 2)  Enalapril Maleate 10 Mg Tabs (Enalapril maleate) .... Once daily 3)  Duac Cs 1-5 % Kit (Clindamycin-benzoyl per-cleans) .... Apply two times a day as needed 4)  Testim 1 % Gel (Testosterone) .... Apply 5 grams once daily as needed    ]Patient: Royal Nguyenthi Note: All result statuses are  Final unless otherwise noted.  Tests: (1) LIPID PROFILE (LIPID)   CHOLESTEROL               140 mg/dL                   1-610       ATP III Classification:             < 200       mg/dL      Desireable            200 - 239    mg/dL      Borderline High            > = 240      mg/dL      High   TRIGLYCERIDES             72 mg/dL                    9-604        Normal:  < 150 mg/dL        Borderline High:  150 - 199 mg/dL   HDL                  [L]  54.0 mg/dL                  >98.1   VLDL CHOLESTEROL          14 mg/dl                    1-91   LDL CHOLESTEROL           92 mg/dl                    4-78  CHOL/HDL Ratio: CHD Risk                             4.2 CALC  Tests: (2) URINE DIPTICK (UDIP)   COLOR                     YELLOW                      YELLOW   CLARITY                   Clear                       CLEAR   SP.GRAVITY                > OR = 1.030                1.000-1.03   URINE pH                  5.5                         5.0 - 8.0   PROTEIN                   Negative                    NEGATIVE   URINE GLUCOSE             NEGATIVE  NEGATIVE   KETONES                   NEGATIVE                    NEGATIVE   URINE BILIRUBIN           NEGATIVE                    NEGATIVE   BLOOD                     NEGATIVE                    NEGATIVE   UROBILINOGEN              0.2 mg/dL                   1.6-1.0   LEUKOCYTE ESTERACE        Negative                    NEGATIVE   NITRITE                   Negative                    NEGATIVE  Tests: (3)  CBC WITH DIFF (CBCD)   WHITE CELL COUNT          7.9 K/uL                    4.5-10.5   RED CELL COUNT            5.25 Mil/uL                 4.22-5.81   HEMOGLOBIN           [L]  12.5 g/dL                   96.0-45.4   HEMATOCRIT                39.1 %                      39.0-52.0   MCV                  [L]  74.5 fl                     78.0-100.0   MCHC                      31.9 g/dL                   09.8-11.9   RDW                       14.5 %                      11.5-14.6   PLATELET COUNT            194 K/uL                    150-400   NEUTROPHIL %              50.3 %  43.0-77.0   LYMPHOCYTE %              39.4 %                      12.0-46.0   MONOCYTE %                7.4 %                       3.0-12.0   EOSINOPHILS %             2.7 %                       0.0-5.0   BASOPHILS %               0.2 %                       0.0-3.0  NEUTROPHILS, ABSOLUTE                             4.0 K/uL                    1.4-7.7   MONOCYTES, ABSOLUTE       0.6 K/uL                    0.1-1.0  EOSINOPHILS, ABSOLUTE                             0.2 K/uL                    0.0-0.7   BASOPHILS, ABSOLUTE       0.0 K/uL                    0.0-0.1  Tests: (4) BASIC METABOLIC PANEL (METABOL)   SODIUM                    142 mEq/L                   135-145   POTASSIUM                 4.2 mEq/L                   3.5-5.1   CHLORIDE                  109 mEq/L                   96-112   CARBON DIOXIDE            28 mEq/L                    19-32   GLUCOSE              [H]  114 mg/dL                   60-45   BUN                       11 mg/dL                    4-09   CREATININE  1.0 mg/dL                   9.1-4.7   CALCIUM                   9.4 mg/dL                   8.2-95.6  GFR (AFRICAN AMERICAN)                             101 mL/min  GFR (NON-AFRICAN AMERICAN)                             84 mL/min  Tests: (5) HEPATIC FUNCTION PANEL (HEPATIC)   TOTAL  BILIRUBIN           0.7 mg/dL                   2.1-3.0   DIRECT BILIRUBIN          0.2 mg/dL                   8.6-5.7   ALKALINE PHOSPHATASE      50 U/L                      39-117   SGOT (AST)           [H]  43 U/L                      0-37   SGPT (ALT)           [H]  57 U/L                      0-53   TOTAL PROTEIN             7.4 g/dL                    8.4-6.9   ALBUMIN                   4.1 g/dL                    6.2-9.5  Tests: (6) PSA_Hyb (PSA)   PSA_Hyb                   0.32 ng/mL                  0.10-4.00  Tests: (7) FastTSH (TSH)   FastTSH                   1.48 uIU/mL                 0.35-5.50  atient: Easten Lopez Note: All result statuses are Final unless otherwise noted.  Tests: (1) HGB A1C (A1C)   A1C%                 [H]  7.5 %                       4.6-6.0     The ADA recommends the following therapeutic goals for glycemic     control related to Hgb A1C measurement.          Goal of Therapy   < 7.0% Hgb A1C  Action Suggested  > 8.0% Hgb A1C

## 2010-12-31 NOTE — Assessment & Plan Note (Signed)
Summary: ok per Dr SD   Vital Signs:  Patient profile:   55 year old male Height:      75 inches Weight:      227 pounds BMI:     28.48 O2 Sat:      97 % on Room air Temp:     98.6 degrees F oral Pulse rate:   76 / minute BP sitting:   138 / 84  (left arm) Cuff size:   large  Vitals Entered By: Beola Cord, CMA (May 12, 2009 11:42 AM)  O2 Flow:  Room air  Primary Care Provider:  Weylyn Ricciuti   History of Present Illness: patient had blun trauma to the  left thumb-caught in car door. He sustained damage to the nail and nail bed of the left thumb with an abnormal nail that is seperating from the old nail and nail bed. No erythema, on-going pain, swelling, fever.  Current Medications (verified): 1)  Glucophage 1000 Mg  Tabs (Metformin Hcl) .... Two Times A Day 2)  Enalapril Maleate 10 Mg  Tabs (Enalapril Maleate) .... Once Daily 3)  Duac Cs 1-5 %  Kit (Clindamycin-Benzoyl Per-Cleans) .... Apply Two Times A Day As Needed 4)  Testim 1 %  Gel (Testosterone) .... Apply 5 Grams Once Daily As Needed  Allergies (verified): No Known Drug Allergies PMH-FH-SH reviewed for relevance  Review of Systems  The patient denies anorexia, fever, suspicious skin lesions, and enlarged lymph nodes.    Physical Exam  General:  Well-developed,well-nourished,in no acute distress; alert,appropriate and cooperative throughout examination Skin:  thumb nail left seperated mid-way out from nail bed but not loose. No evidence infection.   Impression & Recommendations:  Problem # 1:  NAIL DISORDER (ICD-703.9) trauma injury to nail. The nail is not loose or free floating.  Plan: protect the nail as good skin protection. Allow it to grow out. If it becomes loose or if it interferes with work - can avulse.   Problem # 2:  DIABETES MELLITUS, TYPE II (ICD-250.00) Previous A1C at [facility] Feb '10 7.9%. Today 8.9%  Plan: will need to add actos 30mg  once a day to regimen  His updated medication list for  this problem includes:    Glucophage 1000 Mg Tabs (Metformin hcl) .Marland Kitchen..Marland Kitchen Two times a day    Enalapril Maleate 10 Mg Tabs (Enalapril maleate) ..... Once daily    Actos 30 Mg Tabs (Pioglitazone hcl) .Marland Kitchen... 1 by mouth once daily for diabetes  Orders: TLB-A1C / Hgb A1C (Glycohemoglobin) (83036-A1C) TLB-BMP (Basic Metabolic Panel-BMET) (80048-METABOL)  Complete Medication List: 1)  Glucophage 1000 Mg Tabs (Metformin hcl) .... Two times a day 2)  Enalapril Maleate 10 Mg Tabs (Enalapril maleate) .... Once daily 3)  Duac Cs 1-5 % Kit (Clindamycin-benzoyl per-cleans) .... Apply two times a day as needed 4)  Testim 1 % Gel (Testosterone) .... Apply 5 grams once daily as needed 5)  Actos 30 Mg Tabs (Pioglitazone hcl) .Marland Kitchen.. 1 by mouth once daily for diabetes Prescriptions: ACTOS 30 MG TABS (PIOGLITAZONE HCL) 1 by mouth once daily for diabetes  #30 x 12   Entered and Authorized by:   Jacques Navy MD   Signed by:   Jacques Navy MD on 05/13/2009   Method used:   Electronically to        [facility] [address] [id number] (retail)       [address]       [place], [place]  [address]  Ph: [phone]       Fax: [phone]   RxID:   [id number]

## 2010-12-31 NOTE — Letter (Signed)
Summary: Results Follow-up Letter  [facility]  [address] [place], [place] [address]   Phone: [phone]  Fax: [phone]    07/25/2009  [address] [place], [place]  [address]  Dear Mr. YOUNGBLOOD,   The following are the results of your recent test 07/24/09  Test     Result     TSH               Normal Metabol           Normal Urine             normal CBCD              No evidence for infection, but very mild anemia. Will                                                                         need further evaluation with primary dr.                 A1C               High as expected suspect symptoms are due to uncontrolled diabetes as discuss today. Please keep follow-up appt with Dr. Debby Bud. Attached a copy of your labs for your record.    Sincerely,  Lucy Brand,RMA/Dr.Valerie Leschber [facility]

## 2010-12-31 NOTE — Assessment & Plan Note (Signed)
Summary: 2-4 WK FU $50 STC-PER PT RS TO A PHYSICAL--STC   Vital Signs:  Patient profile:   55 year old male Height:      75 inches Weight:      273 pounds BMI:     34.25 O2 Sat:      97 % on Room air Temp:     98.8 degrees F oral Pulse rate:   60 / minute BP sitting:   124 / 88  (left arm) Cuff size:   large  Vitals Entered By: Ami Bullins CMA (September 29, 2009 1:34 PM)  O2 Flow:  Room air CC: pt states doing well, here for follow up office visit. Last ov pt was seen By Dr Felicity Coyer with symptoms of being lightheaded and dizzy. Pt was put on Actos and states his symptoms have subsided with changed in diet and watching what her eats/ ab   Primary Care Jayzen Paver:  Norins  CC:  pt states doing well and here for follow up office visit. Last ov pt was seen By Dr Felicity Coyer with symptoms of being lightheaded and dizzy. Pt was put on Actos and states his symptoms have subsided with changed in diet and watching what her eats/ ab.  History of Present Illness: Patient presents for general medical exam. In August he was feeling bad and he came in to see Dr. Felicity Coyer. Labs revealed elevated A1C and elevated glucose. The  remainder of the lab was normal. He reports that he is feeling better being back on a healthy diet and he is exercising daily.  Did review the remaining labs which look good.   He is otherwise feeling good.   Current Medications (verified): 1)  Glucophage 1000 Mg  Tabs (Metformin Hcl) .... Two Times A Day 2)  Enalapril Maleate 10 Mg  Tabs (Enalapril Maleate) .... Once Daily 3)  Duac Cs 1-5 %  Kit (Clindamycin-Benzoyl Per-Cleans) .... Apply Two Times A Day As Needed 4)  Testim 1 %  Gel (Testosterone) .... Apply 5 Grams Once Daily As Needed 5)  Actos 30 Mg Tabs (Pioglitazone Hcl) .Marland Kitchen.. 1 By Mouth Once Daily For Diabetes 6)  Aspirin 81 Mg Tbec (Aspirin) .Marland Kitchen.. 1 By Mouth Once Daily  Allergies (verified): No Known Drug Allergies  Past History:  Past Medical History: Last  updated: 08/29/2007 UCD pseudofolliculitis barbae biceps tendon rupture Diabetes mellitus, type II Hypertension  Past Surgical History: Last updated: 08/29/2007 nasal polypectomy  Family History: Last updated: 10/07/08 father- deceased @38 : Kidney failure mother- living 22: s/p THR, TKR, DM HTN, negative -CAD; prostate or colon cancer; Brother - pancreatic mass.  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 married 7 yrs, single  7 years, married 13 years (1995) 2 daughters- '80, '81 2 grandchildren - '99, '06  Risk Factors: Exercise: yes (08/29/2007)  Risk Factors: Smoking Status: quit (08/29/2007)  Family History: Reviewed history from Oct 07, 2008 and no changes required. father- deceased @38 : Kidney failure mother- living 3: s/p THR, TKR, DM HTN, negative -CAD; prostate or colon cancer; Brother - pancreatic mass.  Social History: Reviewed history from 07-Oct-2008 and no changes required. 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    Physical Exam  General:  WNWD, large framed AA male in no distress Head:  normocephalic and atraumatic.   Eyes:  vision grossly intact, pupils equal, pupils round, pupils reactive to light, corneas and lenses clear, and no injection.   Ears:  cerumen bilaterally Nose:  no external deformity and no external erythema.   Mouth:  missing several teeth. No buccal lesions, posterior pharynx clear Neck:  full ROM, no thyromegaly, and no carotid bruits.   Chest  Wall:  no deformities.   Lungs:  Normal respiratory effort, chest expands symmetrically. Lungs are clear to auscultation, no crackles or wheezes. Heart:  Normal rate and regular rhythm. S1 and S2 normal without gallop, murmur, click, rub or other extra sounds. Abdomen:  soft, non-tender, normal bowel sounds, no rebound tenderness, no hepatomegaly, and no splenomegaly.   Prostate:  patient deferred. Has an excellent PSA Msk:  normal ROM, no joint tenderness, and no joint swelling.   Pulses:  R and L carotid,radial,femoral,dorsalis pedis and posterior tibial pulses are full and equal bilaterally Extremities:  trace left pedal edema.   Neurologic:  alert & oriented X3, cranial nerves II-XII intact, strength normal in all extremities, and sensation intact to light touch.   Skin:  turgor normal, color normal, no rashes, and no ulcerations.   Cervical Nodes:  no anterior cervical adenopathy and no posterior cervical adenopathy.   Psych:  Oriented X3, memory intact for recent and remote, normally interactive, and good eye contact.    Diabetes Management Exam:    Foot Exam (with socks and/or shoes not present):       Sensory-Pinprick/Light touch:          Right medial foot (L-4): normal          Right dorsal foot (L-5): normal          Right lateral foot (S-1): normal       Sensory-other: minimal decrease in deep vibratory sensation at distal right foot   Impression & Recommendations:  Problem # 1:  OVERWEIGHT (ICD-278.02) Patient has lost 7 lbs in a month. He is actively working on [employer] with better diet choices, portion size control and regular exercise.  Plan - continue this strong work.  Problem # 2:  HYPERTENSION (ICD-401.9)  His updated medication list for this problem includes:    Enalapril Maleate 10 Mg Tabs (Enalapril maleate) ..... Once daily  BP today: 124/88 Prior BP: 140/80 (07/24/2009)  Better control, due in large measure to his life-style management  Plan -  continue present medication  Problem # 3:  DIABETES MELLITUS, TYPE II (ICD-250.00)  His updated medication list for this problem includes:    Glucophage 1000 Mg Tabs (Metformin hcl) .Marland Kitchen..Marland Kitchen Two times a day    Enalapril Maleate 10 Mg Tabs (Enalapril maleate) ..... Once daily    Actos 30 Mg Tabs (Pioglitazone hcl) .Marland Kitchen... 1 by mouth once daily for diabetes    Aspirin 81 Mg Tbec (Aspirin) .Marland Kitchen... 1 by mouth once daily  Labs Reviewed: Creat: 0.9 (07/24/2009)    Reviewed HgBA1c results: 8.5 (07/24/2009)  8.9 (05/12/2009)  Blood sugar remains elevated at 144. However, he is on the right track with exericse and diet control.  Plan - continue present medications.          return for A1C in January '11  Problem # 4:  Preventive Health Care (ICD-V70.0) Unremarkable history except for problems  in August. His exam is normal, but prostate exam deferred. Lab results look fine except for elevated glucose. He is current with colorectal cancer screening. PSA was very low. He reports being current with opthalmology.  In summary - a nice man who is taking charge of his health. He will return for follow-up A1C as noted.  Complete Medication List: 1)  Glucophage 1000 Mg Tabs (Metformin hcl) .... Two times a day 2)  Enalapril Maleate 10 Mg Tabs (Enalapril maleate) .... Once daily 3)  Duac Cs 1-5 % Kit (Clindamycin-benzoyl per-cleans) .... Apply two times a day as needed 4)  Testim 1 % Gel (Testosterone) .... Apply 5 grams once daily as needed 5)  Actos 30 Mg Tabs (Pioglitazone hcl) .Marland Kitchen.. 1 by mouth once daily for diabetes 6)  Aspirin 81 Mg Tbec (Aspirin) .Marland Kitchen.. 1 by mouth once daily  Patient: [name] Note: All result statuses are Final unless otherwise noted.  Tests: (1) Lipid Panel (LIPID)   Cholesterol               138 mg/dL                   1-610     ATP III Classification            Desirable:  < 200 mg/dL                    Borderline High:  200 - 239 mg/dL               High:  > = 240 mg/dL    Triglycerides             50.0 mg/dL                  9.6-045.4     Normal:  <150 mg/dL     Borderline High:  098 - 199 mg/dL   HDL                  [L]  11.91 mg/dL                 >47.82   VLDL Cholesterol          10.0 mg/dL                  9.5-62.1   LDL Cholesterol           96 mg/dL                    3-08  CHO/HDL Ratio:  CHD Risk                             4                    Men          Women     1/2 Average Risk     3.4          3.3     Average Risk          5.0          4.4     2X Average Risk          9.6          7.1     3X Average Risk          15.0  11.0                           Tests: (2) BMP (METABOL)   Sodium                    140 mEq/L                   135-145   Potassium                 4.3 mEq/L                   3.5-5.1   Chloride                  106 mEq/L                   96-112   Carbon Dioxide            28 mEq/L                    19-32   Glucose              [H]  144 mg/dL                   16-10   BUN                       12 mg/dL                    9-60   Creatinine                0.8 mg/dL                   4.5-4.0   Calcium                   9.1 mg/dL                   9.8-11.9   GFR                       130.30 mL/min               >60  Tests: (3) CBC Platelet w/Diff (CBCD)   White Cell Count          6.2 K/uL                    4.5-10.5   Red Cell Count            5.20 Mil/uL                 4.22-5.81   Hemoglobin           [L]  12.9 g/dL                   14.7-82.9   Hematocrit                39.8 %                      39.0-52.0   MCV                  [L]  76.5 fl  78.0-100.0   MCHC                      32.5 g/dL                   81.1-91.4   RDW                       13.8 %                      11.5-14.6   Platelet Count            162.0 K/uL                  150.0-400.0   Neutrophil %         [L]  40.4 %                      43.0-77.0   Lymphocyte %         [H]  47.1 %                      12.0-46.0    Monocyte %                8.4 %                       3.0-12.0   Eosinophils%              3.8 %                       0.0-5.0   Basophils %               0.3 %                       0.0-3.0   Neutrophill Absolute      2.5 K/uL                    1.4-7.7   Lymphocyte Absolute       3.0 K/uL                    0.7-4.0   Monocyte Absolute         0.5 K/uL                    0.1-1.0  Eosinophils, Absolute                             0.2 K/uL                    0.0-0.7   Basophils Absolute        0.0 K/uL                    0.0-0.1  Tests: (4) Hepatic/Liver Function Panel (HEPATIC)   Total Bilirubin           0.5 mg/dL                   7.8-2.9   Direct Bilirubin          0.1 mg/dL                   5.6-2.1   Alkaline Phosphatase  49 U/L                      39-117   AST                       35 U/L                      0-37   ALT                       43 U/L                      0-53   Total Protein             7.4 g/dL                    8.1-1.9   Albumin                   4.0 g/dL                    1.4-7.8  Tests: (5) TSH (TSH)   FastTSH                   1.54 uIU/mL                 0.35-5.50  Tests: (6) Prostate Specific Antigen (PSA)   PSA-Hyb                   0.39 ng/mL                  0.10-4.00  Tests: (7) UDip Only (UDIP)   Color                     LT. YELLOW       RANGE:  Yellow;Lt. Yellow   Clarity                   CLEAR                       Clear   Specific Gravity          >=1.030                     1.000 - 1.030   Urine Ph                  5.5                         5.0-8.0   Protein                   NEGATIVE                    Negative   Urine Glucose             NEGATIVE                    Negative   Ketones                   NEGATIVE                    Negative   Urine Bilirubin  NEGATIVE                    Negative   Blood                     NEGATIVE                    Negative   Urobilinogen              0.2                         0.0 - 1.0    Leukocyte Esterace        NEGATIVE                    Negative   Nitrite                   NEGATIVE                    Negative

## 2010-12-31 NOTE — Progress Notes (Signed)
  Phone Note From Other Clinic   Summary of Call: [facility] called, pt is part of research study and a CT shows a pelvic lesion and lung nodule that needs attention. Report to be faxed.  Initial call taken by: Lamar Sprinkles,  February 05, 2009 3:11 PM  Follow-up for Phone Call        Report given to dr Follow-up by: Lamar Sprinkles,  February 05, 2009 6:16 PM

## 2010-12-31 NOTE — Assessment & Plan Note (Signed)
Summary: NEEDS LABS FOR WORK/ FU FOR FORMS/NWS  #   Vital Signs:  Patient profile:   55 year old male Height:      75 inches Weight:      274 pounds BMI:     34.37 O2 Sat:      96 % on Room air Temp:     98.2 degrees F oral Pulse rate:   73 / minute BP sitting:   138 / 82  (left arm) Cuff size:   large  Vitals Entered By: Bill Salinas CMA (October 09, 2010 1:10 PM)  O2 Flow:  Room air CC: pt here for physical for new job/ ab   Primary Care Provider:  Norins  CC:  pt here for physical for new job/ ab.  History of Present Illness: Patient for follow-up of diabetes adn hypertension and for MD completion of [employer] job application paperwork. He has been feeling well. He has tried to be compliant with his treatment regimen. There have been no hypoglycemic incidents and no illness since his last visit.   Current Medications (verified): 1)  Glucophage 1000 Mg  Tabs (Metformin Hcl) .... Two Times A Day 2)  Enalapril Maleate 10 Mg  Tabs (Enalapril Maleate) .... Once Daily 3)  Duac Cs 1-5 %  Kit (Clindamycin-Benzoyl Per-Cleans) .... Apply Two Times A Day As Needed 4)  Testim 1 %  Gel (Testosterone) .... Apply 5 Grams Once Daily As Needed 5)  Actos 30 Mg Tabs (Pioglitazone Hcl) .Marland Kitchen.. 1 By Mouth Once Daily For Diabetes 6)  Aspirin 81 Mg Tbec (Aspirin) .Marland Kitchen.. 1 By Mouth Once Daily  Allergies (verified): No Known Drug Allergies  Past History:  Past Medical History: Last updated: 08/29/2007 UCD pseudofolliculitis barbae biceps tendon rupture Diabetes mellitus, type II Hypertension  Past Surgical History: Last updated: 08/29/2007 nasal polypectomy FH reviewed for relevance, SH/Risk Factors reviewed for relevance  Review of Systems  The patient denies fever, weight loss, weight gain, chest pain, dyspnea on exertion, abdominal pain, depression, and enlarged lymph nodes.    Physical Exam  General:  large framed, tall AA male in no distress Head:  normocephalic and atraumatic.     Eyes:  C&S clear Lungs:  normal respiratory effort and normal breath sounds.   Heart:  normal rate and regular rhythm.   Neurologic:  alert & oriented X3 and gait normal.   Skin:  turgor normal and color normal.   Psych:  Oriented X3, normally interactive, and good eye contact.     Impression & Recommendations:  Problem # 1:  OVERWEIGHT (ICD-278.02) He will continue to work on [employer] management: smart food choices, portion size control and continued exercise. Target weight 250. Goal - to loose 1. lb per month.   Problem # 2:  HYPERTENSION (ICD-401.9)  His updated medication list for this problem includes:    Enalapril Maleate 10 Mg Tabs (Enalapril maleate) ..... Once daily  BP today: 138/82 Prior BP: 138/52 (12/24/2009)  Labs Reviewed: K+: 4.3 (09/23/2009) Creat: : 0.8 (09/23/2009)   Close to goal. He will continue present medications.  Problem # 3:  DIABETES MELLITUS, TYPE II (ICD-250.00) Last Hgb A1C 7.2 %.He has not been taking Actos.  Plan - continue present regimen, including Actos!  His updated medication list for this problem includes:    Glucophage 1000 Mg Tabs (Metformin hcl) .Marland Kitchen..Marland Kitchen Two times a day    Enalapril Maleate 10 Mg Tabs (Enalapril maleate) ..... Once daily    Actos 30 Mg Tabs (Pioglitazone hcl) .Marland KitchenMarland KitchenMarland KitchenMarland Kitchen  1 by mouth once daily for diabetes    Aspirin 81 Mg Tbec (Aspirin) .Marland Kitchen... 1 by mouth once daily  Problem # 4:  Preventive Health Care (ICD-V70.0) job application medical statement completed - copy for chart.   Complete Medication List: 1)  Glucophage 1000 Mg Tabs (Metformin hcl) .... Two times a day 2)  Enalapril Maleate 10 Mg Tabs (Enalapril maleate) .... Once daily 3)  Duac Cs 1-5 % Kit (Clindamycin-benzoyl per-cleans) .... Apply two times a day as needed 4)  Testim 1 % Gel (Testosterone) .... Apply 5 grams once daily as needed 5)  Actos 30 Mg Tabs (Pioglitazone hcl) .Marland Kitchen.. 1 by mouth once daily for diabetes 6)  Aspirin 81 Mg Tbec (Aspirin) .Marland Kitchen.. 1 by  mouth once daily   Orders Added: 1)  Est. Patient Level III [[address]]

## 2010-12-31 NOTE — Progress Notes (Signed)
Summary: Insurance  Phone Note Call from Patient Call back at [employer] [phone]   Caller: Patient Summary of Call: Patient LM w/ triage requesting information for insurance purposes............Marland KitchenCalled patient w/ no ans - will call pt. tm 06-19-2009 Initial call taken by: Beola Cord, CMA,  June 18, 2009 11:29 AM  Follow-up for Phone Call        called pt back and spoke with him pt states he is going through an appeal with his insurance company because they are charging him a deductible for a procedure Dr, Debby Bud had him do concerning his heart and he just wanted to know do we know off hand anything he may need out his chart to fight the appeal with his insurance company... explained to pt there is nothing off hand I would know he would need but if they ask for something specific or if he finds out he needs anything specific then he can call back...  and we can see what we can do to help him with that documentation in his chart if it is available  Follow-up by: Windell Norfolk,  June 19, 2009 11:49 AM

## 2010-12-31 NOTE — Progress Notes (Signed)
Summary: labs  Phone Note Outgoing Call   Reason for Call: Discuss lab or test results Summary of Call: Please call patient: A1C returned at 8.9% - up from 7.9% when checked at [facility] in [place].  Plan: continue metformin          work hard on diet          add Actos 30mg  daily - Rx elecronically sent to [facility]. Initial call taken by: Jacques Navy MD,  May 13, 2009 6:41 AM

## 2010-12-31 NOTE — Procedures (Signed)
Summary: Colonoscopy   Colonoscopy  Procedure date:  12/27/2008  Findings:      Location:  [facility].    COLONOSCOPY PROCEDURE REPORT  PATIENT:  [name], [name]  MR#:  [id number] BIRTHDATE:   05-29-1956   GENDER:   male  ENDOSCOPIST:   Rachael Fee, MD Referred by: Rosalyn Gess. Norins, M.D.  PROCEDURE DATE:  12/27/2008 PROCEDURE:  Colonoscopy with biopsy, Colonoscopy with snare polypectomy ASA CLASS:   Class II INDICATIONS: colorectal cancer screening, average risk   MEDICATIONS:    Fentanyl 75 mcg IV, Versed 9 mg IV  DESCRIPTION OF PROCEDURE:   After the risks benefits and alternatives of the procedure were thoroughly explained, informed consent was obtained.  Digital rectal exam was performed and revealed no rectal masses.   The LB [id number] endoscope was introduced through the anus and advanced to the cecum, which was identified by both the appendix and ileocecal valve, without limitations.  The quality of the prep was good, using MoviPrep.  The instrument was then slowly withdrawn as the colon was fully examined. <<PROCEDUREIMAGES>>        <<OLD IMAGES>>  FINDINGS:  There were multiple polyps identified and removed. A total of 4 small colon polyps were seen and all were removed. These were all sessile and ranged in size from 2-28mm. They were located in transverse, descending and sigmoid colon. The largest was removed with cold snare and the others were removed with forceps biopsy. All were sent to pathology (see image6 and image7).  This was otherwise a normal examination (see image2, image3, and image8).  Retroflexed views in the rectum revealed no abnormalities.  The scope was then withdrawn from the patient and the procedure completed.  COMPLICATIONS:   None  ENDOSCOPIC IMPRESSION:  1) Polyps, multiple (4 diminutive polyps all removed and sent to pathology)  2) Otherwise normal examination  RECOMMENDATIONS:  1) If the polyp(s) removed today are  proven to be adenomatous (pre-cancerous) polyps, you will need a repeat colonoscopy in 5 years. Otherwise you should continue to follow colorectal cancer screening guidelines for "routine risk" patients with colonoscopy in 10 years.  2) You will recieve a letter within 1-2 weeks with the results of your biopsy as well as final recommendations. Please call my office if you have not received a letter after 3 weeks.  REPEAT EXAM:   await final pathology   _______________________________ Rachael Fee, MD  cc: Illene Regulus, MD       REPORT OF SURGICAL PATHOLOGY   Case #: [id number] Patient Name: [name], [name] Office Chart Number:  N/A   MRN: [id number] Pathologist: Ferd Hibbs. Colonel Bald, MD DOB/Age  Jul 12, 1956 (Age: 55)    Gender: M Date Taken:  12/27/2008 Date Received: 12/27/2008   FINAL DIAGNOSIS   ***MICROSCOPIC EXAMINATION AND DIAGNOSIS***   COLON, TRANSVERSE, DESCENDING, AND SIGMOID, POLYPS:   - HYPERPLASTIC POLYPS (4). - THERE IS NO EVIDENCE OF MALIGNANCY.     cc Date Reported:  12/30/2008     Angel Booty B. Colonel Bald, MD   [facility] [address] [place], [place]  [address]    Dear [name],   Good news.  The polyp(s) that were removed during your recent procedure were NOT pre-cancerous.  You should continue to follow current colorectal cancer screening guidelines with a repeat colonoscopy in 10 years.  We will therefore put your information in our reminder system and will contact you in 10 years to schedule a repeat procedure.  Please call  if you have any questions or concerns.       Sincerely,  Rachael Fee MD  This letter has been electronically signed by your physician.   Signed by Rachael Fee MD on 12/31/2008 at 2:23 PM    This report was created from the original endoscopy report, which was reviewed and signed by the above listed endoscopist.

## 2010-12-31 NOTE — Progress Notes (Signed)
Summary: lab order  Phone Note Call from Patient Call back at [phone]   Caller: Patient Summary of Call: Patient called [facility] needing to know if MD thinks its time to have A1c rechecked. If so, pt would like an order to have it collected. Please advie thanks.Marland KitchenMarland KitchenAlvy Beal Archie CMA  August 10, 2010 2:13 PM   Follow-up for Phone Call        Yep - A1C 250.02 Follow-up by: Jacques Navy MD,  August 10, 2010 6:22 PM  Additional Follow-up for Phone Call Additional follow up Details #1::        Pt informed  Additional Follow-up by: Lamar Sprinkles, CMA,  August 11, 2010 5:19 PM

## 2010-12-31 NOTE — Progress Notes (Signed)
Summary: Forms  Phone Note Call from Patient Call back at [phone]   Caller: Patient Summary of Call: 11:07am-pt called and advised spoke with a Kisha or Myer Peer last Thursday who advised a FMLA form has been complete by Dr. Yetta Barre and would be mailed out that day, and has not received same yet. Initial call taken by: Zackery Barefoot CMA,  March 11, 2009 12:25 PM  Follow-up for Phone Call        Called pt back to inquire on the "forms" in question. Pt advised he dropped off FMLA forms for Dr. Yetta Barre to complete and Bradly Bienenstock called him on last Thursday stating same were ready for pick up. Pt advised was out of town at the time so "Bradly Bienenstock" said she would place the in the mail for him. Advised pt will do some investigation on our end and will give him a call when we find them. Checked in the cabinet in the "box" and same was in there. Made copies of same and have been placed in "to be scanned" bin. Pt informed and will pick up same on Thursday. Forms placed back in cabinet. Follow-up by: Zackery Barefoot CMA,  March 11, 2009 6:51 PM

## 2010-12-31 NOTE — Letter (Signed)
   [facility] [address] [place], [place]  [address] Phone: [phone]      December 11, 2007   Gailen Vandevender [address] [place], [place] [address]  RE:  LAB RESULTS  Dear  Mr. VANROSSUM,  The following is an interpretation of your most recent lab tests.  Please take note of any instructions provided or changes to medications that have resulted from your lab work.    Chest x-ray was unremarkable. There is a stable small nodule that the radiologist feels should be followed in 6 months with repeat chest x-ray.   Call or e-mail me if you have questions ([email or web address])    Sincerely Yours,    Jacques Navy MD

## 2010-12-31 NOTE — Letter (Signed)
Summary: Results Follow-up Letter  [facility]  [address] [place], [place] [address]   Phone: [phone]  Fax: [phone]    09/29/2008  [address] [place], [place]  [address]  Dear [name],   The following are the results of your recent test(s):  Test     Result     hemoglobin A1c, or average blood sugar is high at 7.5  _________________________________________________________  Please call for an appointment  as directed _________________________________________________________ _________________________________________________________ _________________________________________________________  Sincerely,  Sanda Linger MD [facility]

## 2010-12-31 NOTE — Progress Notes (Signed)
  Phone Note Outgoing Call   Call placed by: Zackery Barefoot CMA,  July 05, 2008 10:31 AM Call placed to: Patient Summary of Call: Called and informed pt spouse the xray department does not open until 0830. Initial call taken by: Zackery Barefoot CMA,  July 05, 2008 10:33 AM

## 2010-12-31 NOTE — Letter (Signed)
   [facility] [address] [place], [place]  [address] Phone: [phone]      July 23, 2008   [place] [address] [place], [place] [address]  RE:  LAB RESULTS  Dear  Mr. FLORENDO,  The following is an interpretation of your most recent lab tests.  Please take note of any instructions provided or changes to medications that have resulted from your lab work.     Chest x-ray read out as showing a stable granuloma. No need for any additional study at this time.   Sincerely Yours,    Jacques Navy MD  Appended Document:  Mailed a copy of letter to pt.

## 2010-12-31 NOTE — Miscellaneous (Signed)
Summary: Orders Update  Clinical Lists Changes  Orders: Added new Test order of T-2 View CXR (71020TC) - Signed 

## 2010-12-31 NOTE — Progress Notes (Signed)
Summary: TESTOSTERONE  Phone Note Refill Request Call back at [phone]   Refills Requested: Medication #1:  TESTIM 1 %  GEL APPLY 5 GRAMS once daily as needed Patient has been off med x 4 to 5 mths. He would like a refill. OK?   Initial call taken by: Lamar Sprinkles, CMA,  August 20, 2010 11:56 AM  Follow-up for Phone Call        ok for refills as needed  Follow-up by: Jacques Navy MD,  August 20, 2010 1:31 PM    Prescriptions: TESTIM 1 %  GEL (TESTOSTERONE) APPLY 5 GRAMS once daily as needed  #1 month supp x 0   Entered by:   Ami Bullins CMA   Authorized by:   Jacques Navy MD   Signed by:   Bill Salinas CMA on 08/20/2010   Method used:   Electronically to        [id number] (retail)       [address] [place], [place]  [address]       Ph: [phone]       Fax: [phone]   RxID:   [id number]

## 2010-12-31 NOTE — Assessment & Plan Note (Signed)
Vital Signs:  Patient Profile:   55 Years Old Male Weight:      279 pounds Temp:     98.4 degrees F oral Pulse rate:   67 / minute BP sitting:   148 / 92  (right arm)                 PCP:  Norins   History of Present Illness: Pt folllowed for NIDDM and HTN. 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    married 7 yrs, single  7 years, married 13 years (1995)    2 daughters    2 grandchildren   Risk Factors:  Tobacco use:  quit    Year quit:  1992 Alcohol use:  no Exercise:  yes    Times per week:  5    Type:  cardio-fitness Seatbelt use:  100 %  Colonoscopy History:     Date of Last Colonoscopy:  09/15/2007    Results:  Normal   Review of Systems  The patient denies anorexia, fever, weight loss, vision loss, decreased hearing, hoarseness, chest pain, syncope, dyspnea on exhertion, peripheral edema, prolonged cough, hemoptysis, abdominal pain, melena, hematochezia, severe indigestion/heartburn, hematuria, incontinence, genital sores, muscle weakness, suspicious skin lesions, transient blindness, difficulty walking, depression, unusual weight change, enlarged lymph nodes, angioedema, and testicular masses.     Physical Exam  General:     alert, well-developed, well-nourished, well-hydrated, normal appearance, and overweight-appearing.   Head:     Normocephalic and atraumatic without obvious abnormalities. No apparent alopecia or balding. Eyes:     No corneal or conjunctival inflammation noted. EOMI. Perrla. Funduscopic exam benign, without hemorrhages, exudates or papilledema. Vision grossly  normal. Ears:     External ear exam shows no significant lesions or deformities.  Otoscopic examination reveals clear canals, tympanic membranes are intact bilaterally without bulging, retraction, inflammation or discharge. Hearing is grossly normal bilaterally. Mouth:     no oral lesions. Does have several broken teeth. Some increased wear. Neck:     No deformities, masses, or tenderness noted. Chest Wall:     No deformities, masses, tenderness or gynecomastia noted. Breasts:     mild gynecomastia Lungs:     Normal respiratory effort, chest expands symmetrically. Lungs are clear to auscultation, no crackles or wheezes. Heart:     Normal rate and regular rhythm. S1 and S2 normal without gallop, murmur, click, rub or other extra sounds. Abdomen:     soft, non-tender, normal bowel sounds, no distention, no masses, no guarding, no rigidity, and no inguinal hernia.   Rectal:     No external abnormalities noted. Normal sphincter tone. No rectal masses or tenderness. Genitalia:     Testes bilaterally descended without nodularity, tenderness or masses. No scrotal masses or lesions. No penis lesions or urethral discharge. Prostate:     Prostate gland firm and smooth, no enlargement, nodularity, tenderness, mass, asymmetry or induration. Msk:     No deformity or scoliosis noted of thoracic or lumbar spine.   Pulses:  R and L carotid,radial,femoral,dorsalis pedis and posterior tibial pulses are full and equal bilaterally Extremities:     No clubbing, cyanosis, edema, or deformity noted with normal full range of motion of all joints.   Neurologic:     No cranial nerve deficits noted. Station and gait are normal. Plantar reflexes are down-going bilaterally. DTRs are symmetrical throughout. Sensory, motor and coordinative functions appear intact. normal sensation to light touch/pin-prick and deep vibratory sensation at feet. Skin:     face without active lesions. Scar on left abdomen. Cervical  Nodes:     No lymphadenopathy noted Psych:     Cognition and judgment appear intact. Alert and cooperative with normal attention span and concentration. No apparent delusions, illusions, hallucinations    Impression & Recommendations:  Problem # 1:  DIABETES MELLITUS, TYPE II (ICD-250.00)  His updated medication list for this problem includes:    Glucophage 1000 Mg Tabs (Metformin hcl) .Marland Kitchen..Marland Kitchen Two times a day    Enalapril Maleate 10 Mg Tabs (Enalapril maleate) ..... Once daily  great control with A1C 6.9%, down form 8.4% in May '08.  LDL cholesterol 95.  Plan: continue LSM and metformin.   Problem # 2:  HYPERTENSION (ICD-401.9)  His updated medication list for this problem includes:    Enalapril Maleate 10 Mg Tabs (Enalapril maleate) ..... Once daily  good control.  Plan: Continue medications.   Problem # 3:  PSEUDOFOLLICULITIS BARBAE (ICD-704.8) stable. refill prescription provided  Problem # 4:  ERECTILE DYSFUNCTION, MILD (ICD-302.72) patient with mild hypogonadism.  Plan: testim 5gm applied daily  Problem # 5:  Preventive Health Care (ICD-V70.0) patient scheduled for colonoscopy Oct 17th Advised to loose 12 lbs/year through good food choices, exercise and portion size reduction.  Problem # 6:  Screening PSA (ICD-V76.44) Done: 0.36 is a normal value. Also normal prostate exam  Complete Medication List: 1)  Glucophage 1000 Mg Tabs (Metformin hcl) .... Two times a day 2)  Enalapril Maleate 10 Mg Tabs (Enalapril maleate) .... Once daily 3)  Duac Cs 1-5 % Kit (Clindamycin-benzoyl per-cleans) .... Apply two times a day as needed     ]

## 2010-12-31 NOTE — Assessment & Plan Note (Signed)
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   Vital Signs:  Patient profile:   55 year old male Height:      75 inches (190.50 cm) Weight:      280.0 pounds (127.27 kg) O2 Sat:      97 % Temp:     99.0 degrees F (37.22 degrees C) oral Pulse rate:   61 / minute BP sitting:   140 / 80  (left arm) Cuff size:   large  Vitals Entered By: Orlan Leavens (July 24, 2009 10:57 AM) CC: Pt states he not feeling well x's couple week. This am he states he has been off balance, lightheaded, upset stomach.  Is Patient Diabetic? Yes  Pain Assessment Patient in pain? no      CBG Result 202 CBG Device ID check pt BS this am Ask pt when was the last he check BS he stated he never has Back in June md prescribe actos pt states he wasnt never aware of med has not been taking   Primary Care Provider:  Norins  CC:  Pt states he not feeling well x's couple week. This am he states he has been off balance, lightheaded, and upset stomach. .  History of Present Illness: here today because "feels bad" onset 2-3 weeks ago symptoms include - off balance, dizzy, lightheaded no headache had to leave work today because of this feeling no headache, chest pain or nausea occ blurring vision bilaterally with symptoms  no any falls or weakness does not check sugars at home   Allergies: No Known Drug Allergies  Past History:  Past Medical History: Reviewed history from 08/29/2007 and no changes required. UCD pseudofolliculitis barbae biceps tendon rupture Diabetes mellitus, type II Hypertension  Review of Systems  The patient denies anorexia, fever, weight loss, weight gain, chest pain, syncope, peripheral edema, and abdominal pain.    Physical Exam  General:  alert, well-developed, well-nourished, and cooperative to examination.    Eyes:  vision grossly intact; pupils equal, round and reactive to light.  conjunctiva and lids normal.    Ears:  normal pinnae  bilaterally, without erythema, swelling, or tenderness to palpation. TMs clear, without effusion, or cerumen impaction. Hearing grossly normal bilaterally  Mouth:  teeth and gums in good repair; mucous membranes moist, without lesions or ulcers. oropharynx clear without exudate, erythema.  Lungs:  normal respiratory effort, no intercostal retractions or use of accessory muscles; normal breath sounds bilaterally - no crackles and no wheezes.    Heart:  normal rate, regular rhythm, no murmur, and no rub. BLE without edema.  Neurologic:  alert & oriented X3 and cranial nerves II-XII symetrically intact.  strength normal in all extremities, sensation intact to light touch, and gait normal. speech fluent without dysarthria or aphasia; follows commands with good comprehension.    Impression & Recommendations:  Problem # 1:  FATIGUE (ICD-780.79)  suspect d/t uncontrolled DM - see next also check labs r/o dehydration (Bmet), infection (CBC, UA), or thyroid problems (TSH)  Orders: TLB-CBC Platelet - w/Differential (85025-CBCD) TLB-BMP (Basic Metabolic Panel-BMET) (80048-METABOL) TLB-TSH (Thyroid Stimulating Hormone) (84443-TSH) TLB-Udip ONLY (81003-UDIP)  Problem # 2:  DIABETES MELLITUS, TYPE II (ICD-250.00) uncontrolled - and sub op tx as did not get msg to start Actos in 6/10 - check labs today r/o underlying problem -see above start ACtos and push by mouth fluids next few days reck PCP 2-4 weeks to re discuss get glucometer for pt to check home CBGs and call  if >250 His updated medication list for this problem includes:    Glucophage 1000 Mg Tabs (Metformin hcl) .Marland Kitchen..Marland Kitchen Two times a day    Enalapril Maleate 10 Mg Tabs (Enalapril maleate) ..... Once daily    Actos 30 Mg Tabs (Pioglitazone hcl) .Marland Kitchen... 1 by mouth once daily for diabetes    Aspirin 81 Mg Tbec (Aspirin) .Marland Kitchen... 1 by mouth once daily  Orders: Glucose, (CBG) (82962) TLB-A1C / Hgb A1C (Glycohemoglobin) (83036-A1C)  Labs  Reviewed: Creat: 1.0 (05/12/2009)    Reviewed HgBA1c results: 8.9 (05/12/2009)  7.5 (09/26/2008)  Complete Medication List: 1)  Glucophage 1000 Mg Tabs (Metformin hcl) .... Two times a day 2)  Enalapril Maleate 10 Mg Tabs (Enalapril maleate) .... Once daily 3)  Duac Cs 1-5 % Kit (Clindamycin-benzoyl per-cleans) .... Apply two times a day as needed 4)  Testim 1 % Gel (Testosterone) .... Apply 5 grams once daily as needed 5)  Actos 30 Mg Tabs (Pioglitazone hcl) .Marland Kitchen.. 1 by mouth once daily for diabetes 6)  Aspirin 81 Mg Tbec (Aspirin) .Marland Kitchen.. 1 by mouth once daily  Patient Instructions: 1)  will check labs today to look for other problems that may be causing your symptoms  -most likely due to diabetes 2)  Drink as much fluid as you can tolerate for the next few days. 3)  start Actos 30mg  - at your pharmacy - as per dr. Alvera Novel previous instructions 4)  will also prescribe glucometer so you can check your sugar every AM at home 5)  bring this "sugar record book" to your next appt with dr. Debby Bud - schedule in 2-4 weeks to review 6)  if worsening symptoms (chest pain, passing out, nausea or vomitting, dizziness with falls) - go to ER - or call sooner for reevaluation! 7)  Take an Aspirin 81mg   every day. 8)  Check your blood sugars regularly. If your readings are usually above 250 or below 70 you should contact our office.

## 2010-12-31 NOTE — Progress Notes (Signed)
Summary: CXR  ---- Converted from flag ---- ---- 07/03/2008 10:08 AM, Jacques Navy MD wrote: needs follow-up chest x-ray. Please call patient to set up a convenient time for follow-up chest x-ray. Thanks ------------------------------  Phone Note Outgoing Call Call back at [facility] Phone [phone]   Call placed by: Zackery Barefoot CMA,  July 03, 2008 10:32 AM Call placed to: Patient Summary of Call: Called pt on home # allowing to ring 7 times, no answer. Also called pt work # recording stated "the # you are trying to call is not reachable". Initial call taken by: Zackery Barefoot CMA,  July 03, 2008 10:34 AM  Follow-up for Phone Call        Called pt again, no answer after multiple rings,  received recording requesting access code. Follow-up by: Zackery Barefoot CMA,  July 03, 2008 1:14 PM  Additional Follow-up for Phone Call Additional follow up Details #1::        Pt states 07/22/2008 AM works for him. Need Dx code to order CXR. Also is that date ok? Additional Follow-up by: Zackery Barefoot CMA,  July 04, 2008 11:47 AM  New Problems: ABNORMAL CHEST XRAY (ICD-793.1)   Additional Follow-up for Phone Call Additional follow up Details #2::    date is ok. Dx code is for abnormal chest x-ray follow-up Follow-up by: Jacques Navy MD,  July 04, 2008 2:03 PM  Additional Follow-up for Phone Call Additional follow up Details #3:: Details for Additional Follow-up Action Taken: Order in EMR & IDX. Additional Follow-up by: Zackery Barefoot CMA,  July 04, 2008 2:07 PM  New Problems: ABNORMAL CHEST XRAY (ICD-793.1)

## 2010-12-31 NOTE — Assessment & Plan Note (Signed)
Summary: PER FLAG SCHED -DIABETIC CONTROL--D/T--STC   Vital Signs:  Patient profile:   55 year old male Height:      75 inches Weight:      276 pounds BMI:     34.62 O2 Sat:      98 % on Room air Temp:     97.4 degrees F oral Pulse rate:   58 / minute BP sitting:   138 / 52  (left arm) Cuff size:   large  Vitals Entered By: Bill Salinas CMA (December 24, 2009 9:56 AM)  O2 Flow:  Room air CC: Diabetic follow up, pt here to discuss elevated A1C/ ab   Primary Care Provider:  Tyrin Herbers  CC:  Diabetic follow up and pt here to discuss elevated A1C/ ab.  History of Present Illness: patient presents for follow up of elevated A1C  Current Medications (verified): 1)  Glucophage 1000 Mg  Tabs (Metformin Hcl) .... Two Times A Day 2)  Enalapril Maleate 10 Mg  Tabs (Enalapril Maleate) .... Once Daily 3)  Duac Cs 1-5 %  Kit (Clindamycin-Benzoyl Per-Cleans) .... Apply Two Times A Day As Needed 4)  Testim 1 %  Gel (Testosterone) .... Apply 5 Grams Once Daily As Needed 5)  Actos 30 Mg Tabs (Pioglitazone Hcl) .Marland Kitchen.. 1 By Mouth Once Daily For Diabetes 6)  Aspirin 81 Mg Tbec (Aspirin) .Marland Kitchen.. 1 By Mouth Once Daily  Allergies (verified): No Known Drug Allergies  Past History:  Past Surgical History: Last updated: 08/29/2007 nasal polypectomy  Review of Systems  The patient denies anorexia, weight loss, vision loss, chest pain, dyspnea on exertion, headaches, abdominal pain, hematuria, muscle weakness, and difficulty walking.    Physical Exam  General:  Well-developed,well-nourished,in no acute distress; alert,appropriate and cooperative throughout examination Lungs:  normal respiratory effort and normal breath sounds.   Heart:  normal rate and regular rhythm.   Neurologic:  alert & oriented X3 and gait normal.     Impression & Recommendations:  Problem # 1:  DIABETES MELLITUS, TYPE II (ICD-250.00) Reveiwed record: in August A1C 8.5%. Patient reports that he has managed his diet and  continued to exercise. A1C is no 8.3%. During the course of the interview he admits that he has not been taking his medications. In fact, at the last visit when acos was added he failed to report that he was not taking metformin.  Discussed mechanisms of disease and goals of therapy and the importance of risk reduction.  Plan - take metformin 1000mg  bid. Hold actos for now.           follow-up lab in 3 months; CBGs in the office at his descretion.  (25 min in face to face counseling and education)  His updated medication list for this problem includes:    Glucophage 1000 Mg Tabs (Metformin hcl) .Marland Kitchen..Marland Kitchen Two times a day    Enalapril Maleate 10 Mg Tabs (Enalapril maleate) ..... Once daily    Actos 30 Mg Tabs (Pioglitazone hcl) .Marland Kitchen... 1 by mouth once daily for diabetes    Aspirin 81 Mg Tbec (Aspirin) .Marland Kitchen... 1 by mouth once daily  Complete Medication List: 1)  Glucophage 1000 Mg Tabs (Metformin hcl) .... Two times a day 2)  Enalapril Maleate 10 Mg Tabs (Enalapril maleate) .... Once daily 3)  Duac Cs 1-5 % Kit (Clindamycin-benzoyl per-cleans) .... Apply two times a day as needed 4)  Testim 1 % Gel (Testosterone) .... Apply 5 grams once daily as needed 5)  Actos 30 Mg  Tabs (Pioglitazone hcl) .Marland Kitchen.. 1 by mouth once daily for diabetes 6)  Aspirin 81 Mg Tbec (Aspirin) .Marland Kitchen.. 1 by mouth once daily

## 2010-12-31 NOTE — Progress Notes (Signed)
Summary: Appointment  Phone Note Other Incoming   Summary of Call: Per Dr. Debby Bud pt needs OV next week. With there only being two days next week its up to the pt what time and there will be a wait. Please call pt and schedule same. Initial call taken by: Zackery Barefoot CMA,  February 07, 2009 8:47 AM  Follow-up for Phone Call        Pt sched appt for 3/15 w/Dr Norins. Pt aware he is being worked in, and that Dr Debby Bud schedule if full and he would have a wait and to bring a book. Follow-up by: Verdell Face,  February 07, 2009 10:21 AM

## 2010-12-31 NOTE — Letter (Signed)
Summary: Results Letter  [facility]  [address] [place], [place] [address]   Phone: [phone]  Fax: [phone]        December 31, 2008 MRN: [id number]    [facility], [place] [address] [place], [place]  [address]    Dear Mr. BAKKEN,   Good news.  The polyp(s) that were removed during your recent procedure were NOT pre-cancerous.  You should continue to follow current colorectal cancer screening guidelines with a repeat colonoscopy in 10 years.  We will therefore put your information in our reminder system and will contact you in 10 years to schedule a repeat procedure.  Please call if you have any questions or concerns.       Sincerely,  Rachael Fee MD  This letter has been electronically signed by your physician.

## 2010-12-31 NOTE — Miscellaneous (Signed)
Summary: Colonoscopy prep  Clinical Lists Changes  Medications: Added new medication of MOVIPREP 100 GM  SOLR (PEG-KCL-NACL-NASULF-NA ASC-C) As per prep instructions. - Signed Rx of MOVIPREP 100 GM  SOLR (PEG-KCL-NACL-NASULF-NA ASC-C) As per prep instructions.;  #1 x 0;  Signed;  Entered by: Burman Foster RN;  Authorized by: Rachael Fee MD;  Method used: Electronically to [id number] [address], [place], [place]  [address], Ph: [phone], Fax: [phone]    Prescriptions: MOVIPREP 100 GM  SOLR (PEG-KCL-NACL-NASULF-NA ASC-C) As per prep instructions.  #1 x 0   Entered by:   Burman Foster RN   Authorized by:   Rachael Fee MD   Signed by:   Burman Foster RN on 12/19/2008   Method used:   Electronically to        [id number] (retail)       [address] [place], [place]  [address]       Ph: [phone]       Fax: [phone]   RxID:   [id number]

## 2010-12-31 NOTE — Progress Notes (Signed)
        Vision Screening:      Vision Comments: Eye Exam 01/20/2010 with Dr Clarene Critchley, Slit lamp exam was normal, no diabetic retinopathy and recommended eye exam once a year  Vision Entered By: Ami Bullins CMA (January 26, 2010 3:06 PM)

## 2010-12-31 NOTE — Progress Notes (Signed)
----   Converted from flag ---- ---- 12/18/2009 5:53 AM, Jacques Navy MD wrote: patient needs an appointment to go over diabetes control  Thanks ------------------------------  Gave pt 12/24/09 @ 945A-w/Dr NOrins  -phone

## 2011-01-06 ENCOUNTER — Ambulatory Visit: Payer: Self-pay

## 2011-01-06 ENCOUNTER — Encounter (INDEPENDENT_AMBULATORY_CARE_PROVIDER_SITE_OTHER): Payer: Self-pay | Admitting: *Deleted

## 2011-01-06 ENCOUNTER — Ambulatory Visit (INDEPENDENT_AMBULATORY_CARE_PROVIDER_SITE_OTHER): Payer: BC Managed Care – PPO

## 2011-01-06 DIAGNOSIS — F528 Other sexual dysfunction not due to a substance or known physiological condition: Secondary | ICD-10-CM

## 2011-01-06 NOTE — Letter (Signed)
Summary: Out of Work  [facility]  [address] [place], [place] [address]   Phone: [phone]  Fax: [phone]    December 29, 2010   Employee:  Mayjor A Bunda    To Whom It May Concern:   The above named patient was seen in our office January 28th 2012. This was to rule out a possible GU infection. He was properly evaluated and able to return to work the same day with no restrictions. If you need additional information, please feel free to contact our office with a medical release form.         Sincerely,    M. Dominick Morella M.D.

## 2011-01-06 NOTE — Progress Notes (Signed)
Summary: Work Note  Phone Note Call from Patient   Summary of Call: Pt is req a note from MD regarding office visit saturday. left mess to call office back for more details of what is needed. Initial call taken by: Lamar Sprinkles, CMA,  December 29, 2010 1:31 PM  Follow-up for Phone Call        Spoke w/pt - He left work saturday b/c he felt bad and was concerned about possible prostate infection. No infection and so he went back to wk. Employer needs note - ok per MD> Pt aware that note will be ready for pick up tomorrow.  Follow-up by: Lamar Sprinkles, CMA,  December 29, 2010 2:21 PM

## 2011-01-06 NOTE — Assessment & Plan Note (Signed)
Summary: PERSONAL ISSUES/RCD   Vital Signs:  Patient profile:   55 year old male Height:      76 inches Weight:      272 pounds BMI:     33.23 Temp:     98.4 degrees F Pulse rate:   77 / minute BP sitting:   130 / 82  (left arm) Cuff size:   regular  Vitals Entered By: Lamar Sprinkles, CMA (December 26, 2010 11:01 AM) CC: frequent urination, no erection x 3 wks concerned about prostate problem/SD   Primary Care Provider:  Norins  CC:  frequent urination and no erection x 3 wks concerned about prostate problem/SD.  History of Present Illness: [name] presents due to impotence. He reports that not only does he have poor erectil function but that his level of libido is depressed. He is concerned that this may be a prostate problem. He has had no symptoms of prostatitis, e.g. low back pain, groin pain, pain with urination. He has only minimal signs of prostatism.   Current Medications (verified): 1)  Glucophage 1000 Mg  Tabs (Metformin Hcl) .... Two Times A Day 2)  Enalapril Maleate 10 Mg  Tabs (Enalapril Maleate) .... Once Daily 3)  Duac Cs 1-5 %  Kit (Clindamycin-Benzoyl Per-Cleans) .... Apply Two Times A Day As Needed 4)  Testim 1 %  Gel (Testosterone) .... Apply 5 Grams Once Daily As Needed 5)  Actos 30 Mg Tabs (Pioglitazone Hcl) .Marland Kitchen.. 1 By Mouth Once Daily For Diabetes 6)  Aspirin 81 Mg Tbec (Aspirin) .Marland Kitchen.. 1 By Mouth Once Daily  Allergies (verified): No Known Drug Allergies  Past History:  Past Medical History: Last updated: 08/29/2007 UCD pseudofolliculitis barbae biceps tendon rupture Diabetes mellitus, type II Hypertension  Past Surgical History: Last updated: 08/29/2007 nasal polypectomy FH reviewed for relevance, SH/Risk Factors reviewed for relevance  Review of Systems       The patient complains of decreased hearing.  The patient denies anorexia, weight loss, weight gain, vision loss, chest pain, dyspnea on exertion, abdominal pain, incontinence, genital  sores, muscle weakness, and enlarged lymph nodes.         decreased hearing graded as mild by audiology - disqualified him from [employer] job.   Physical Exam  General:  alert, well-developed, well-nourished, well-hydrated, and healthy-appearing.   Head:  normocephalic and atraumatic.   Neck:  supple.   Lungs:  normal respiratory effort.   Heart:  normal rate and regular rhythm.     Impression & Recommendations:  Problem # 1:  ERECTILE DYSFUNCTION, MILD (ICD-302.72) worsening problem. His U/A was negative and he has no signs or symptoms of infection. He is diabetic with reasonable control. The concern, given the decreased libido as well as erectile dysfunction, is hypogonadism.  Plan - testosterone level with recommendations to follow           sample of viagra is provided.   ( greater than 50% of this 25 minute encounter devoted to education and counseling)  Complete Medication List: 1)  Glucophage 1000 Mg Tabs (Metformin hcl) .... Two times a day 2)  Enalapril Maleate 10 Mg Tabs (Enalapril maleate) .... Once daily 3)  Duac Cs 1-5 % Kit (Clindamycin-benzoyl per-cleans) .... Apply two times a day as needed 4)  Testim 1 % Gel (Testosterone) .... Apply 5 grams once daily as needed 5)  Actos 30 Mg Tabs (Pioglitazone hcl) .Marland Kitchen.. 1 by mouth once daily for diabetes 6)  Aspirin 81 Mg Tbec (Aspirin) .Marland KitchenMarland KitchenMarland Kitchen 1  by mouth once daily   Orders Added: 1)  Est. Patient Level III [99213]    Laboratory Results   Urine Tests   Date/Time Reported: Lamar Sprinkles, CMA  December 26, 2010 11:07 AM   Routine Urinalysis   Color: yellow Appearance: Clear Glucose: negative   (Normal Range: Negative) Bilirubin: negative   (Normal Range: Negative) Ketone: negative   (Normal Range: Negative) Spec. Gravity: 1.010   (Normal Range: 1.003-1.035) Blood: negative   (Normal Range: Negative) pH: 5.0   (Normal Range: 5.0-8.0) Protein: negative   (Normal Range: Negative) Urobilinogen: negative   (Normal Range:  0-1) Nitrite: negative   (Normal Range: Negative) Leukocyte Esterace: negative   (Normal Range: Negative)

## 2011-01-06 NOTE — Progress Notes (Signed)
  Phone Note From Other Clinic   Summary of Call: Low Testosterone per MD - Pt needs rx for Testosterone 200/mL 2 mL im every 4 wks. MD spoke w/pt - he will call office to schedule nurse visit after he picks up rx.  Initial call taken by: Lamar Sprinkles, CMA,  December 31, 2010 10:16 AM  Follow-up for Phone Call        yep Follow-up by: Jacques Navy MD,  December 31, 2010 1:20 PM    New/Updated Medications: TESTOSTERONE CYPIONATE 200 MG/ML OIL (TESTOSTERONE CYPIONATE) 2 mL every 4 wks Prescriptions: TESTOSTERONE CYPIONATE 200 MG/ML OIL (TESTOSTERONE CYPIONATE) 2 mL every 4 wks  #10 ml x 0   Entered by:   Lamar Sprinkles, CMA   Authorized by:   Jacques Navy MD   Signed by:   Lamar Sprinkles, CMA on 12/31/2010   Method used:   Telephoned to ...       [facility] [id number]* (retail)       [address] [place], [place]  [address]       Ph: [phone]       Fax: [phone]   RxID:   [id number]

## 2011-01-14 NOTE — Assessment & Plan Note (Signed)
Summary: testosterone injection/lb   Nurse Visit   Allergies: No Known Drug Allergies  Medication Administration  Injection # 1:    Medication: Testosterone Cypionat 200mg  ing    Diagnosis: ERECTILE DYSFUNCTION, MILD (ICD-302.72)    Route: IM    Site: LUOQ gluteus    Exp Date: 07/2012    Lot #: [id number]    Mfr: paddock    Patient tolerated injection without complications    Given by: Ami Bullins CMA (January 06, 2011 11:16 AM)  Orders Added: 1)  Testosterone Cypionat 200mg  ing [J1080]

## 2011-03-15 LAB — GLUCOSE, CAPILLARY
Glucose-Capillary: 125 mg/dL — ABNORMAL HIGH (ref 70–99)
Glucose-Capillary: 134 mg/dL — ABNORMAL HIGH (ref 70–99)
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
## 2011-04-16 NOTE — Op Note (Signed)
[name], [name]               ACCOUNT NO.:  [id number]   MEDICAL RECORD NO.:  [id number]          PATIENT TYPE:  AMB   LOCATION:  SDS                          FACILITY:  [facility]   PHYSICIAN:  Robert L. Dione Booze, M.D.  DATE OF BIRTH:  [birth year]   DATE OF PROCEDURE:  03/09/2007  DATE OF DISCHARGE:                               OPERATIVE REPORT   INDICATIONS AND JUSTIFICATION FOR THE PROCEDURE:  [name] was  seen in my office on February 08, 2007 through the referral of Dr. Celene Kras regarding severe left lower lid ectropion.  He reports that his  eyes stay red and the left lid has been turning outward for about a year  or more.  Examination shows the vision is 20/25 minus with each eye and  the pressure is 15 in the right eye and 16 in the left and confrontation  fields were full.  The pupils motility, conjunctivae, cornea, anterior  chamber and dilated fundus examination shows some optic nerve cupping  and the conjunctiva on the left is red.  The eyelid examination shows  ectropion bilaterally with the left significantly worse than the right.  The temporal portion of the left lower eyelid is turned way down.  The  problem was discussed and the patient decided to have this repaired  surgically because of the chronic inflammation and possible infection.  He is also possibly diabetic and there was no diabetic retinopathy.   JUSTIFICATION FOR PERFORMING THE PROCEDURE IN AN OUTPATIENT SETTING:  Routine.   JUSTIFICATION FOR OVERNIGHT STAY:  None.   PREOPERATIVE DIAGNOSIS:  Severe ectropion of the left lower eyelid.   POSTOPERATIVE DIAGNOSIS:  Severe ectropion of the left lower eyelid.   OPERATION PERFORMED:  Surgical repair of ectropion of the left lower  eyelid.   SURGEON:  Robert L. Dione Booze, M.D.   ANESTHESIA:  Xylocaine 1% with epinephrine.   PROCEDURE:  The patient arrived in the minor surgery room at [facility] and was prepped and draped in the routine  fashion.  Xylocaine  1% with epinephrine was given to the temporal portion of the left lower  eyelid.  First an incision was made in the temporal canthus and then the  lower eyelid was cut from its attachment to the canthus.  The lid was  split to separate the tarsal layer from the skin layer and some portion  of temporal skin was excised.  The lid margin for about 5 mm was removed  and the conjunctiva inside the tarsus was removed to create a tarsal  strip.  The strip was mobilized and attached using two 4-0 black silk  sutures to the orbital margin temporally and somewhat superiorly.  Next,  the wound that was left was closed with numerous interrupted 6-0 chromic  gut sutures and some additional skin that was removed in this process.  Polysporin ointment was used and the eye was patched and the patient  left the minor room having done nicely.   FOLLOWUP CARE:  The patient is to remove the patch tomorrow and start  Polysporin ointment twice  daily, warm compresses.  He is to be seen in  my office in 2 weeks to see how he is doing.           ______________________________  Doris Cheadle. Dione Booze, M.D.     RLG/MEDQ  D:  03/09/2007  T:  03/09/2007  Job:  [id number]   cc:   Wynelle Cleveland, MD  Rosalyn Gess. Norins, MD

## 2011-04-16 NOTE — Letter (Signed)
September 03, 2007     RE:  ADEMOLA, VERT  MRN:  [id number]  /  DOB:  25-Jun-1956   Dear Mr. Stgermaine:   I have received the report from your chest x-ray, performed August 29, 2007.   This x-ray was read out as showing probable chronic bronchitic changes,  which is associated with smoking. Also noted was a small nodular density  of the left lung base. The radiologist did not think this was  particularly worrisome but I do recommend that we do a followup chest x-  ray in 3 months. Please mark your calendar, so that you will remember  and call us to be sure that this is ready, as scheduled for you. We will  put an order into the system.   If you have any questions about this x-ray report or this letter, do not  hesitate to contact me.    Sincerely,      Rosalyn Gess. Norins, MD  Electronically Signed    MEN/MedQ  DD: 09/03/2007  DT: 09/03/2007  Job #: [id number]

## 2011-05-06 ENCOUNTER — Other Ambulatory Visit (INDEPENDENT_AMBULATORY_CARE_PROVIDER_SITE_OTHER): Payer: BC Managed Care – PPO

## 2011-05-06 ENCOUNTER — Telehealth: Payer: Self-pay | Admitting: *Deleted

## 2011-05-06 DIAGNOSIS — E119 Type 2 diabetes mellitus without complications: Secondary | ICD-10-CM

## 2011-05-06 LAB — HEMOGLOBIN A1C: Hgb A1c MFr Bld: 7.8 % — ABNORMAL HIGH (ref 4.6–6.5)

## 2011-05-06 NOTE — Telephone Encounter (Signed)
Patient requesting to have A1c labs today. Last A1c was January, pt is overdue, lab order entered, Patient informed

## 2011-05-10 ENCOUNTER — Encounter: Payer: Self-pay | Admitting: Internal Medicine

## 2011-05-15 ENCOUNTER — Encounter: Payer: Self-pay | Admitting: Internal Medicine

## 2011-05-17 ENCOUNTER — Encounter: Payer: Self-pay | Admitting: Internal Medicine

## 2011-05-18 ENCOUNTER — Ambulatory Visit (INDEPENDENT_AMBULATORY_CARE_PROVIDER_SITE_OTHER): Payer: BC Managed Care – PPO | Admitting: Internal Medicine

## 2011-05-18 VITALS — BP 142/96 | HR 71 | Temp 97.5°F | Wt 277.0 lb

## 2011-05-18 DIAGNOSIS — F528 Other sexual dysfunction not due to a substance or known physiological condition: Secondary | ICD-10-CM

## 2011-05-18 DIAGNOSIS — N529 Male erectile dysfunction, unspecified: Secondary | ICD-10-CM

## 2011-05-18 DIAGNOSIS — E663 Overweight: Secondary | ICD-10-CM

## 2011-05-18 DIAGNOSIS — E119 Type 2 diabetes mellitus without complications: Secondary | ICD-10-CM

## 2011-05-18 DIAGNOSIS — J069 Acute upper respiratory infection, unspecified: Secondary | ICD-10-CM

## 2011-05-18 DIAGNOSIS — I10 Essential (primary) hypertension: Secondary | ICD-10-CM

## 2011-05-18 MED ORDER — PIOGLITAZONE HCL 45 MG PO TABS: 45.0000 mg | ORAL_TABLET | Freq: Every day | ORAL | Status: DC

## 2011-05-18 MED ORDER — TESTOSTERONE CYPIONATE 200 MG/ML IM SOLN: INTRAMUSCULAR | Status: DC
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
## 2011-05-18 NOTE — Patient Instructions (Signed)
Diabetes - rising A1C to 7.8 %. Plan - work on low carb, no/low sugar snacks, smart food choices, portion size control. Will increase actos to 45mg  at next refill.  Erectile dysfunction - should be taking 400mg  every 4 weeks. OK to inject 200mg  today and then 400mg  the first part of every month. After 4-8 weeks at the new dose we can check a testosterone level and go from there.

## 2011-05-19 ENCOUNTER — Telehealth: Payer: Self-pay | Admitting: *Deleted

## 2011-05-19 MED ORDER — HYDROCHLOROTHIAZIDE 25 MG PO TABS: 25.0000 mg | ORAL_TABLET | Freq: Every day | ORAL | Status: DC

## 2011-05-19 MED ORDER — TESTOSTERONE CYPIONATE 200 MG/ML IM SOLN: INTRAMUSCULAR | Status: DC

## 2011-05-19 NOTE — Telephone Encounter (Signed)
Rx did not go through on 05/18/11--phoned in. Pt Informed.

## 2011-05-19 NOTE — Assessment & Plan Note (Signed)
BP Readings from Last 3 Encounters:  05/18/11 142/96  12/26/10 130/82  10/09/10 138/82   Suboptimal control of BP with a goal of consistently being at 130-80's  Plan - will add hydorchlorothiazide 25mg  to his daily regimen.

## 2011-05-19 NOTE — Assessment & Plan Note (Signed)
Progressive rise in A1C. Recent exacerbating factors include a change in diet/snack food choices due to dental work. He does work hard to follow a diabetic diet. Did discuss the need to control calorie intake - use of the hand as a guide to portion size.  Plan - increase actos to 45mg  at next refill           Continue metformin dose           Diligence in regard to diet and continued exercise           Repeat A1C 3 months after starting new actos dose.

## 2011-05-19 NOTE — Assessment & Plan Note (Signed)
Not getting adequate results from testosterone replacement: he is less fatigued but continues to have erectile dysfunction. It turns out that he has been on a sub-therapeutic dose of testosterone at 200mg  q 28 days. He has tried viagra with no success.  Plan - increase testosterone to 400mg  q 28 days.           For persistent ED on this higher dose will check testosterone level.           If testosterone level is in normal range will refer to urology.

## 2011-05-19 NOTE — Assessment & Plan Note (Signed)
Discussed the importance of weight management: "golden rules:" smart food choices, PORTION SIZE CONTROL; regular exercise.

## 2011-05-21 ENCOUNTER — Telehealth: Payer: Self-pay | Admitting: *Deleted

## 2011-05-21 DIAGNOSIS — F528 Other sexual dysfunction not due to a substance or known physiological condition: Secondary | ICD-10-CM

## 2011-05-21 NOTE — Telephone Encounter (Signed)
Recommend getting a testosterone level and base recommendations on that result. Order enterd

## 2011-05-21 NOTE — Telephone Encounter (Signed)
Patient says he discussed increase in testosterone but he was already taking 400 mg monthly. He wants to increase dose or frequency, please advise.

## 2011-05-24 NOTE — Telephone Encounter (Signed)
Left detailed vm for pt.

## 2011-05-25 ENCOUNTER — Other Ambulatory Visit: Payer: Self-pay | Admitting: *Deleted

## 2011-05-25 MED ORDER — PIOGLITAZONE HCL 45 MG PO TABS: 45.0000 mg | ORAL_TABLET | Freq: Every day | ORAL | Status: DC

## 2011-05-27 ENCOUNTER — Other Ambulatory Visit: Payer: BC Managed Care – PPO

## 2011-05-27 DIAGNOSIS — F528 Other sexual dysfunction not due to a substance or known physiological condition: Secondary | ICD-10-CM

## 2011-05-28 LAB — TESTOSTERONE, FREE, TOTAL, SHBG
Sex Hormone Binding: 21 nmol/L (ref 13–71)
Testosterone, Free: 54.2 pg/mL (ref 47.0–244.0)
Testosterone-% Free: 2.4 % (ref 1.6–2.9)
Testosterone: 223.13 ng/dL — ABNORMAL LOW (ref 250–890)

## 2011-05-30 ENCOUNTER — Telehealth: Payer: Self-pay | Admitting: Internal Medicine

## 2011-05-30 NOTE — Telephone Encounter (Signed)
Please call. The testosterone level is low at 223. As recommended the dose should be 400mg  IM every 4 weeks.  Let's see how you do with the higher dose.

## 2011-05-31 NOTE — Telephone Encounter (Signed)
Lm on pts home phone and cell phone to call back for results

## 2011-05-31 NOTE — Telephone Encounter (Signed)
Informed pt .

## 2011-08-07 ENCOUNTER — Other Ambulatory Visit: Payer: Self-pay | Admitting: Internal Medicine

## 2011-08-09 ENCOUNTER — Encounter: Payer: Self-pay | Admitting: Internal Medicine

## 2011-08-09 ENCOUNTER — Ambulatory Visit (INDEPENDENT_AMBULATORY_CARE_PROVIDER_SITE_OTHER): Payer: BC Managed Care – PPO | Admitting: Internal Medicine

## 2011-08-09 ENCOUNTER — Encounter: Payer: Self-pay | Admitting: *Deleted

## 2011-08-09 ENCOUNTER — Other Ambulatory Visit (INDEPENDENT_AMBULATORY_CARE_PROVIDER_SITE_OTHER): Payer: BC Managed Care – PPO

## 2011-08-09 VITALS — BP 148/88 | HR 87 | Temp 100.1°F | Ht 76.0 in

## 2011-08-09 DIAGNOSIS — L03119 Cellulitis of unspecified part of limb: Secondary | ICD-10-CM

## 2011-08-09 DIAGNOSIS — M609 Myositis, unspecified: Secondary | ICD-10-CM

## 2011-08-09 DIAGNOSIS — L02419 Cutaneous abscess of limb, unspecified: Secondary | ICD-10-CM

## 2011-08-09 DIAGNOSIS — IMO0001 Reserved for inherently not codable concepts without codable children: Secondary | ICD-10-CM

## 2011-08-09 LAB — CBC WITH DIFFERENTIAL/PLATELET
Basophils Absolute: 0 10*3/uL (ref 0.0–0.1)
Basophils Relative: 0.3 % (ref 0.0–3.0)
Eosinophils Absolute: 0.2 10*3/uL (ref 0.0–0.7)
Eosinophils Relative: 1.7 % (ref 0.0–5.0)
HCT: 43.8 % (ref 39.0–52.0)
Hemoglobin: 13.6 g/dL (ref 13.0–17.0)
Lymphocytes Relative: 21.5 % (ref 12.0–46.0)
Lymphs Abs: 2.8 10*3/uL (ref 0.7–4.0)
MCHC: 31.1 g/dL (ref 30.0–36.0)
MCV: 73.4 fl — ABNORMAL LOW (ref 78.0–100.0)
Monocytes Absolute: 1.1 10*3/uL — ABNORMAL HIGH (ref 0.1–1.0)
Monocytes Relative: 8.3 % (ref 3.0–12.0)
Neutro Abs: 8.9 10*3/uL — ABNORMAL HIGH (ref 1.4–7.7)
Neutrophils Relative %: 68.2 % (ref 43.0–77.0)
Platelets: 186 10*3/uL (ref 150.0–400.0)
RBC: 5.97 Mil/uL — ABNORMAL HIGH (ref 4.22–5.81)
RDW: 16.4 % — ABNORMAL HIGH (ref 11.5–14.6)
WBC: 13.1 10*3/uL — ABNORMAL HIGH (ref 4.5–10.5)

## 2011-08-09 LAB — SEDIMENTATION RATE: Sed Rate: 11 mm/hr (ref 0–22)

## 2011-08-09 MED ORDER — SULFAMETHOXAZOLE-TRIMETHOPRIM 800-160 MG PO TABS: 1.0000 | ORAL_TABLET | Freq: Two times a day (BID) | ORAL | Status: AC

## 2011-08-09 MED ORDER — CEPHALEXIN 500 MG PO CAPS: 500.0000 mg | ORAL_CAPSULE | Freq: Three times a day (TID) | ORAL | Status: AC

## 2011-08-09 MED ORDER — DICLOFENAC SODIUM 75 MG PO TBEC: 75.0000 mg | DELAYED_RELEASE_TABLET | Freq: Two times a day (BID) | ORAL | Status: AC

## 2011-08-09 NOTE — Progress Notes (Signed)
Subjective:    Patient ID: [name], male    DOB: 1956-06-25, 55 y.o.   MRN: [id number]  HPI  complains of fever and "flu-like" symptoms describes diffuse myalgia - worst in R thigh - and fever but denies coryza, headache, sneezing, sore throat or headache  No sick contacts Onset 1 week ago - gradually worsening, esp R thigh Not improved with OTC meds (alz seltzer, theraflu) ?precipitated by testosterone injection Mon 9/3 (labor day) - no prior reaction to injections but very tender at site of injection 5 days after shot, now involving entire R thigh Difficulty ambulating due to thigh pain - denies bruising or other trauma or leg/thigh  Past Medical History  Diagnosis Date  . ABNORMAL CHEST XRAY 2009  . DIABETES MELLITUS, TYPE II   . ERECTILE DYSFUNCTION, MILD   . HYPERTENSION   . Overweight   . PSEUDOFOLLICULITIS BARBAE   . PULMONARY NODULE, RIGHT UPPER LOBE 02/10/2009  . Biceps tendon rupture     Review of Systems  Constitutional: Positive for fever, chills, diaphoresis and fatigue.  HENT: Negative for ear pain, congestion, sore throat, facial swelling, rhinorrhea, sneezing, trouble swallowing, neck pain, dental problem and postnasal drip.   Respiratory: Negative for shortness of breath.   Cardiovascular: Positive for leg swelling. Negative for chest pain.  Musculoskeletal: Positive for myalgias and gait problem. Negative for back pain.  Skin: Negative for rash.  Hematological: Negative for adenopathy. Does not bruise/bleed easily.       Objective:   Physical Exam BP 148/88  Pulse 87  Temp(Src) 100.1 F (37.8 C) (Oral)  Ht 6\' 4"  (1.93 m)  SpO2 97%  Constitutional:  Fatigued, but appears well-developed and well-nourished. No distress. Wife at side Neck: Normal range of motion. Neck supple. No JVD or LAD present. No thyromegaly present.  Cardiovascular: Normal rate, regular rhythm and normal heart sounds.  No murmur heard. no BLE edema Pulmonary/Chest: Effort  normal and breath sounds normal. No respiratory distress. no wheezes.  Abdominal: Soft. Bowel sounds are normal. Patient exhibits no distension. There is no tenderness.  Musculoskeletal: R anterior thigh with abnormal warmth and swelling compared to left - area of palpable induration proximally at site of IM test - painful active and passive motion R quad but neurovasc function intact, no compartment signs Neurological: he is alert and oriented to person, place, and time. No cranial nerve deficit. Coordination normal.  Skin: See Mskel - otherwise skin is warm and dry.  No erythema or ulceration.  Psychiatric: he has a normal mood and affect. behavior is normal. Judgment and thought content normal.   Lab Results  Component Value Date   WBC 6.2 09/23/2009   HGB 12.9* 09/23/2009   HCT 39.8 09/23/2009   PLT 162.0 09/23/2009   CHOL 138 09/23/2009   TRIG 50.0 09/23/2009   HDL 32.50* 09/23/2009   ALT 43 09/23/2009   AST 35 09/23/2009   NA 140 09/23/2009   K 4.3 09/23/2009   CL 106 09/23/2009   CREATININE 0.8 09/23/2009   BUN 12 09/23/2009   CO2 28 09/23/2009   TSH 1.54 09/23/2009   PSA 0.39 09/23/2009   HGBA1C 7.8* 05/06/2011       Assessment & Plan:  R thigh pain and warmth/swelling following IM testost injection 08/02/11 -  Suspect abscess or hematoma following injection no new vial of test or other meds or change in technique but ?other myositis -  stop all IM injections/ testost at this time -  check  labs and refer for imaging: CT thigh with/without contrast for quick results Start keflex and septra - erx done - also voltaren for antiinflammatory tx/pain Out of work due to leg pain next 5d and 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

## 2011-08-09 NOTE — Patient Instructions (Signed)
It was good to see you today. Test(s) ordered today. Your results will be called to you after review (48-72hours after test completion). If any changes need to be made, you will be notified at that time. Stop all testosterone injections at this time - will review at later time other methods of hormone administration we'll make referral for imaging of leg to look for infection . Our office will contact you regarding appointment(s) once made. Start 2 antibiotics: Keflex and Septra - also antiinflammatory medication for swelling and pain (voltaren)  Your prescription(s) have been submitted to your pharmacy. Please take as directed and contact our office if you believe you are having problem(s) with the medication(s). Please schedule followup in 4 days for recheck (me or Dr. Debby Bud), call sooner if problems.

## 2011-08-10 ENCOUNTER — Ambulatory Visit (INDEPENDENT_AMBULATORY_CARE_PROVIDER_SITE_OTHER)
Admission: RE | Admit: 2011-08-10 | Discharge: 2011-08-10 | Disposition: A | Discharge status: Home / Self Care | Payer: BC Managed Care – PPO | Source: Ambulatory Visit | Attending: Internal Medicine | Admitting: Internal Medicine

## 2011-08-10 DIAGNOSIS — L02419 Cutaneous abscess of limb, unspecified: Secondary | ICD-10-CM

## 2011-08-10 DIAGNOSIS — M609 Myositis, unspecified: Secondary | ICD-10-CM

## 2011-08-10 DIAGNOSIS — L03119 Cellulitis of unspecified part of limb: Secondary | ICD-10-CM

## 2011-08-10 DIAGNOSIS — IMO0001 Reserved for inherently not codable concepts without codable children: Secondary | ICD-10-CM

## 2011-08-10 LAB — BASIC METABOLIC PANEL
BUN: 9 mg/dL (ref 6–23)
CO2: 29 mEq/L (ref 19–32)
Calcium: 9.3 mg/dL (ref 8.4–10.5)
Chloride: 102 mEq/L (ref 96–112)
Creatinine, Ser: 1 mg/dL (ref 0.4–1.5)
GFR: 100 mL/min (ref >60.00)
Glucose, Bld: 205 mg/dL — ABNORMAL HIGH (ref 70–99)
Potassium: 4.3 mEq/L (ref 3.5–5.1)
Sodium: 138 mEq/L (ref 135–145)

## 2011-08-10 LAB — CK: Total CK: 733 U/L — ABNORMAL HIGH (ref 7–232)

## 2011-08-10 LAB — HEPATIC FUNCTION PANEL
ALT: 35 U/L (ref 0–53)
AST: 32 U/L (ref 0–37)
Albumin: 4 g/dL (ref 3.5–5.2)
Alkaline Phosphatase: 51 U/L (ref 39–117)
Bilirubin, Direct: 0.1 mg/dL (ref 0.0–0.3)
Total Bilirubin: 0.5 mg/dL (ref 0.3–1.2)
Total Protein: 7.6 g/dL (ref 6.0–8.3)

## 2011-08-10 MED ORDER — IOHEXOL 300 MG/ML  SOLN
100.0000 mL | Freq: Once | INTRAMUSCULAR | Status: AC | PRN
  Administered 2011-08-10: 100 mL via INTRAVENOUS

## 2011-08-11 ENCOUNTER — Other Ambulatory Visit: Payer: Self-pay | Admitting: Internal Medicine

## 2011-08-11 ENCOUNTER — Telehealth: Payer: Self-pay | Admitting: *Deleted

## 2011-08-11 DIAGNOSIS — L02419 Cutaneous abscess of limb, unspecified: Secondary | ICD-10-CM

## 2011-08-11 NOTE — Telephone Encounter (Signed)
I spoke w/Dr Felicity Coyer this afternoon. Please review result notes from her in CT results document. She feels there is nothing further to be done at this point except keep f/u with Dr Debby Bud on Friday. Canceled referral for ortho.

## 2011-08-11 NOTE — Telephone Encounter (Signed)
[facility] [name] spoke w/Cindy w/Dr Derrell Lolling at [facility] - Pt needs referral to ortho for abscess. Dr Derrell Lolling will not touch this b/c it is involved in his muscle.

## 2011-08-11 NOTE — Telephone Encounter (Signed)
[facility] notified - stat ortho consult for abscess

## 2011-08-13 ENCOUNTER — Ambulatory Visit (INDEPENDENT_AMBULATORY_CARE_PROVIDER_SITE_OTHER): Payer: BC Managed Care – PPO | Admitting: Internal Medicine

## 2011-08-13 VITALS — BP 178/110 | HR 73 | Temp 98.5°F | Wt 285.0 lb

## 2011-08-13 DIAGNOSIS — L0390 Cellulitis, unspecified: Secondary | ICD-10-CM

## 2011-08-13 DIAGNOSIS — L0291 Cutaneous abscess, unspecified: Secondary | ICD-10-CM

## 2011-08-16 NOTE — Progress Notes (Signed)
  Subjective:    Patient ID: [name], male    DOB: [birth year], 55 y.o.   MRN: [id number]  HPI [name] returns for follow up of cellulitis and myositis right thigh. Reviewed [name] note, radiology images and report with patient along with [name] note re: discussion with radiologist. [name] reports that he has seen real improvement: decreased swelling, redness and pain.   He is troubled by the denial by his insurance company of a June visit that was for chronic disease management but did include updated discussion of organic impotence. Insurance is denying his claim for visit and lab due to coverage exception for "hormonal" issues. Letter provided to patient to assist in his appeal.  I have reviewed the patient's medical history in detail and updated the computerized patient record.    Review of Systems System review is negative for any constitutional, cardiac, pulmonary, GI or neuro symptoms or complaints     Objective:   Physical Exam Vitals - no fever. BP very high. On repeat: 160/90 with a large cuff Gen'l - an overweight AA man in NAD Leg - right thigh with minimal swelling, no surface erythema, mild tenderness proximal dorsal thigh with no fluctuance.       Assessment & Plan:  Cellulits - along with myositis and mild abscess right thing - looks better per previous report and today's exam  Plan - complete antibiotics.

## 2011-09-22 ENCOUNTER — Other Ambulatory Visit: Payer: Self-pay | Admitting: Internal Medicine

## 2011-09-22 NOTE — Telephone Encounter (Signed)
Please advise if ok for RF 

## 2011-09-24 ENCOUNTER — Telehealth: Payer: Self-pay | Admitting: *Deleted

## 2011-09-24 NOTE — Telephone Encounter (Signed)
ok 

## 2011-09-24 NOTE — Telephone Encounter (Signed)
Refill request for Testosterone CYP 200 mg/ml SIG Inject 400mg  (2mL) intramuscular once a month. Last fill was 08/25/2011. Please Advise refill [phone]

## 2011-09-24 NOTE — Telephone Encounter (Signed)
Ok fr refill x2

## 2011-09-27 MED ORDER — TESTOSTERONE CYPIONATE 200 MG/ML IM SOLN: 400.0000 mg | INTRAMUSCULAR | Status: DC

## 2012-01-01 ENCOUNTER — Ambulatory Visit (INDEPENDENT_AMBULATORY_CARE_PROVIDER_SITE_OTHER): Payer: BC Managed Care – PPO | Admitting: Internal Medicine

## 2012-01-01 ENCOUNTER — Encounter: Payer: Self-pay | Admitting: Internal Medicine

## 2012-01-01 VITALS — BP 148/82 | HR 76 | Temp 97.3°F | Ht 74.0 in | Wt 279.0 lb

## 2012-01-01 DIAGNOSIS — R5383 Other fatigue: Secondary | ICD-10-CM

## 2012-01-01 DIAGNOSIS — E663 Overweight: Secondary | ICD-10-CM

## 2012-01-01 DIAGNOSIS — R5381 Other malaise: Secondary | ICD-10-CM

## 2012-01-01 DIAGNOSIS — E119 Type 2 diabetes mellitus without complications: Secondary | ICD-10-CM

## 2012-01-01 DIAGNOSIS — F528 Other sexual dysfunction not due to a substance or known physiological condition: Secondary | ICD-10-CM

## 2012-01-01 LAB — POCT URINALYSIS DIPSTICK
Bilirubin, UA: NEGATIVE
Blood, UA: NEGATIVE
Glucose, UA: 2000
Ketones, UA: NEGATIVE
Leukocytes, UA: NEGATIVE
Nitrite, UA: NEGATIVE
Protein, UA: NEGATIVE
Spec Grav, UA: 1.02
Urobilinogen, UA: 0.2
pH, UA: 5

## 2012-01-01 NOTE — Patient Instructions (Signed)
I am ruling out infection, anemia, uncontrolled diabetes, and low T4 with the labs I have ordered today to rule out causes of your new onset daitgue.  Please followup with dr. Debby Bud in the next 2 weeks to discuss your lab results.   Remember to clean your skin with alcohol before injecting your testosterone to prevent  another muscle infection (let the alcohol dry first)

## 2012-01-01 NOTE — Progress Notes (Signed)
Subjective:    Patient ID: [name], male    DOB: [birth year], 56 y.o.   MRN: [id number]  HPI  56 yr old male with history of DM type 2, hypotestoeronism on monthly IM injections with prior episode of myositis at injection site in Sept 2012, presents [facility] with 3 day history of fatigue and general malaise .  He has stayed out of work for 3 days due to symptoms,  Had a  Viral  URI 2 weeks which resolved spontaneously.  Denies bleeding, diarrhea,, nause and vomiting, and chest pain/dyspnea,  Has, upon questioning, been urinating more frequently but denies change in stream, prostate or penile pain or discharge.  Denies fevers.  He sled discontinued his  Actos a few months ago and has not been checking his sugars or followed up with PCP since medication stopped.  He has been  taking testosterone injections once a month for the past 9 months and his next injection is due today or tomorrow.    Past Medical History  Diagnosis Date  . ABNORMAL CHEST XRAY 2009  . DIABETES MELLITUS, TYPE II   . ERECTILE DYSFUNCTION, MILD   . HYPERTENSION   . Overweight   . PSEUDOFOLLICULITIS BARBAE   . PULMONARY NODULE, RIGHT UPPER LOBE 02/10/2009  . Biceps tendon rupture    Current Outpatient Prescriptions on File Prior to Visit  Medication Sig Dispense Refill  . aspirin 81 MG tablet Take 81 mg by mouth daily.        . Clindamycin-Benzoyl Per-Cleans (DUAC CS) external kit Apply topically 2 (two) times daily.        . diclofenac (VOLTAREN) 75 MG EC tablet Take 1 tablet (75 mg total) by mouth 2 (two) times daily with a meal. X 1 week, then as needed for pain  30 tablet  0  . enalapril (VASOTEC) 10 MG tablet Take 10 mg by mouth daily.        . hydrochlorothiazide 25 MG tablet Take 1 tablet (25 mg total) by mouth daily.  30 tablet  3  . metFORMIN (GLUCOPHAGE) 1000 MG tablet TAKE 1 TABLET BY MOUTH TWICE DAILY  180 tablet  1  . testosterone cypionate (DEPOTESTOTERONE CYPIONATE) 200 MG/ML  injection Inject 2 mLs (400 mg total) into the muscle every 28 (twenty-eight) days.  10 mL  2  . pioglitazone (ACTOS) 45 MG tablet Take 1 tablet (45 mg total) by mouth daily.  90 tablet  3    Review of Systems  Constitutional: Positive for diaphoresis and fatigue. Negative for fever, chills, activity change, appetite change and unexpected weight change.  HENT: Negative for hearing loss, ear pain, nosebleeds, congestion, sore throat, facial swelling, rhinorrhea, sneezing, drooling, mouth sores, trouble swallowing, neck pain, neck stiffness, dental problem, voice change, postnasal drip, sinus pressure, tinnitus and ear discharge.   Eyes: Negative for photophobia, pain, discharge, redness, itching and visual disturbance.  Respiratory: Negative for apnea, cough, choking, chest tightness, shortness of breath, wheezing and stridor.   Cardiovascular: Positive for leg swelling. Negative for chest pain and palpitations.  Gastrointestinal: Negative for nausea, vomiting, abdominal pain, diarrhea, constipation, blood in stool, abdominal distention, anal bleeding and rectal pain.  Genitourinary: Positive for frequency. Negative for dysuria, urgency, hematuria, flank pain, decreased urine volume, scrotal swelling, difficulty urinating and testicular pain.  Musculoskeletal: Negative for myalgias, back pain, joint swelling, arthralgias and gait problem.  Skin: Negative for color change, rash and wound.  Neurological: Negative for dizziness, tremors, seizures, syncope,  speech difficulty, weakness, light-headedness, numbness and headaches.  Psychiatric/Behavioral: Negative for suicidal ideas, hallucinations, behavioral problems, confusion, sleep disturbance, dysphoric mood, decreased concentration and agitation. The patient is not nervous/anxious.        Objective:   Physical Exam  Constitutional: He is oriented to person, place, and time.  HENT:  Head: Normocephalic and atraumatic.  Mouth/Throat: Oropharynx  is clear and moist.  Eyes: EOM are normal. Pupils are equal, round, and reactive to light. Right conjunctiva is injected.    Neck: Normal range of motion. Neck supple. No JVD present. No thyromegaly present.  Cardiovascular: Normal rate, regular rhythm and normal heart sounds.   Pulmonary/Chest: Effort normal and breath sounds normal. He has no wheezes. He has no rales.  Abdominal: Soft. Bowel sounds are normal. He exhibits no mass. There is no tenderness. There is no rebound.  Musculoskeletal: Normal range of motion. He exhibits no edema.  Neurological: He is alert and oriented to person, place, and time.  Skin: Skin is warm and dry.  Psychiatric: He has a normal mood and affect.      Assessment & Plan:   Fatigue The ddx for fatigue is broad.  Given his urinary frequency,  I suspect mild dehydration secondary to glycosuria secondary to uncontrolled DM vs smoldering infection vs persistent  low T4, or new onset anemia.  Will repeat all labs including hgba1c and have patient followup with Dr. Debby Bud.     Updated Medication List Outpatient Encounter Prescriptions as of 01/01/2012  Medication Sig Dispense Refill  . aspirin 81 MG tablet Take 81 mg by mouth daily.        . Clindamycin-Benzoyl Per-Cleans (DUAC CS) external kit Apply topically 2 (two) times daily.        . diclofenac (VOLTAREN) 75 MG EC tablet Take 1 tablet (75 mg total) by mouth 2 (two) times daily with a meal. X 1 week, then as needed for pain  30 tablet  0  . enalapril (VASOTEC) 10 MG tablet Take 10 mg by mouth daily.        . hydrochlorothiazide 25 MG tablet Take 1 tablet (25 mg total) by mouth daily.  30 tablet  3  . metFORMIN (GLUCOPHAGE) 1000 MG tablet TAKE 1 TABLET BY MOUTH TWICE DAILY  180 tablet  1  . testosterone cypionate (DEPOTESTOTERONE CYPIONATE) 200 MG/ML injection Inject 2 mLs (400 mg total) into the muscle every 28 (twenty-eight) days.  10 mL  2  . pioglitazone (ACTOS) 45 MG tablet Take 1 tablet (45 mg total)  by mouth daily.  90 tablet  3

## 2012-01-01 NOTE — Assessment & Plan Note (Addendum)
The ddx for fatigue is broad.  Given his urinary frequency,  I suspect mild dehydration secondary to glycosuria secondary to uncontrolled DM vs smoldering infection vs persistent  low T4, or new onset anemia.  Will repeat all labs including hgba1c and have patient followup with Dr. Debby Bud.

## 2012-01-03 ENCOUNTER — Telehealth: Payer: Self-pay

## 2012-01-03 NOTE — Telephone Encounter (Signed)
The only lab back is the U/a. The Saturday labs are sent out and I haven't seen a report. Will let him know results when available.  If he is feeling worse he may need a follow-up office visit.

## 2012-01-03 NOTE — Telephone Encounter (Signed)
Pt called stating he was seen by Dr Darrick Huntsman at Saturday clinic and giving a out of work note for today. Pt says he was off today but does not think he will be able to go to work tomorrow. Pt is requesting an extension to include tomorrow and return Wednesday, please advise.

## 2012-01-03 NOTE — Telephone Encounter (Signed)
Ok for extension of work excuse

## 2012-01-03 NOTE — Telephone Encounter (Signed)
Pt advised that work note extension has been prepared but pt then stated that he does not know if he will be okay to return to work on Wednesday since he is feeling worse. Pt then requested the results of labs done at Sat clinic, to see what is "wrong with" him. I have not been able to locate lab results.

## 2012-01-04 ENCOUNTER — Ambulatory Visit (INDEPENDENT_AMBULATORY_CARE_PROVIDER_SITE_OTHER): Payer: BC Managed Care – PPO | Admitting: Internal Medicine

## 2012-01-04 ENCOUNTER — Encounter: Payer: Self-pay | Admitting: Internal Medicine

## 2012-01-04 DIAGNOSIS — F528 Other sexual dysfunction not due to a substance or known physiological condition: Secondary | ICD-10-CM

## 2012-01-04 DIAGNOSIS — E119 Type 2 diabetes mellitus without complications: Secondary | ICD-10-CM

## 2012-01-04 DIAGNOSIS — E663 Overweight: Secondary | ICD-10-CM

## 2012-01-04 DIAGNOSIS — I10 Essential (primary) hypertension: Secondary | ICD-10-CM

## 2012-01-04 MED ORDER — TESTOSTERONE CYPIONATE 200 MG/ML IM SOLN: INTRAMUSCULAR | Status: DC

## 2012-01-04 NOTE — Telephone Encounter (Signed)
Called [facility] lab and was forwarded to [facility]: requested copy of labs results faxed over. Received fax and forwarded to MD/MEN.

## 2012-01-04 NOTE — Patient Instructions (Signed)
Viral illness - the white blood count is normal and your exam is unremarkable. Your illness is most likely a prolonged viral illness. Plan - rest, well balanced meals, hydration, tylenol for fever and aches. May return to work Feb 13th/.  Diabetes - OUT OF CONTROL. a1c 9.2%!!!!!!!  Plan - continue metformin. Add januvia 100 mg daily.  Testosterone deficiency - level is low at 165.  Plan - increase testosteorne to 400 mg (2 ml) every 14 days.  The remainder of the lab results were normal.    Viral Syndrome You or your child has Viral Syndrome. It is the most common infection causing "colds" and infections in the nose, throat, sinuses, and breathing tubes. Sometimes the infection causes nausea, vomiting, or diarrhea. The germ that causes the infection is a virus. No antibiotic or other medicine will kill it. There are medicines that you can take to make you or your child more comfortable.   HOME CARE INSTRUCTIONS    Rest in bed until you start to feel better.     If you have diarrhea or vomiting, eat small amounts of crackers and toast. Soup is helpful.     Do not give aspirin or medicine that contains aspirin to children.     Only take over-the-counter or prescription medicines for pain, discomfort, or fever as directed by your caregiver.  SEEK IMMEDIATE MEDICAL CARE IF:    You or your child has not improved within one week.     You or your child has pain that is not at least partially relieved by over-the-counter medicine.     Thick, colored mucus or blood is coughed up.     Discharge from the nose becomes thick yellow or green.     Diarrhea or vomiting gets worse.     There is any major change in your or your child's condition.     You or your child develops a skin rash, stiff neck, severe headache, or are unable to hold down food or fluid.     You or your child has an oral temperature above 102 F (38.9 C), not controlled by medicine.     Your baby is older than 3 months with  a rectal temperature of 102 F (38.9 C) or higher.     Your baby is 5 months old or younger with a rectal temperature of 100.4 F (38 C) or higher.  Document Released: 10/31/2006 Document Revised: 07/28/2011 Document Reviewed: 11/01/2007 [facility] Patient Information 2012 [place], [place].

## 2012-01-06 NOTE — Progress Notes (Signed)
  Subjective:    Patient ID: [name], male    DOB: 1956/09/11, 56 y.o.   MRN: [id number]  HPI Mr. Billups has had persistent weakness, malaise, mild cough, low grade fever. He was seen in Saturday clinic by Dr. Darrick Huntsman - note reviewed. He presents for continued symptoms and to review labs. He is still feeling bad.  Prior to this illness he has been working on diet and exercise. He did have his teeth extracted and is waiting on full denture.  PMH, FamHx and SocHx reviewed for any changes and relevance.    Review of Systems System review is negative for any constitutional, cardiac, pulmonary, GI or neuro symptoms or complaints other than as described in the HPI.     Objective:   Physical Exam Filed Vitals:   01/04/12 1541  BP: 160/92  Pulse: 78  Temp: 98.1 F (36.7 C)  Resp: 16  Weight: 280 lb (127.007 kg)  Gen'l- large framed AA man in no distress HEENT - throat is clear Pulm - normal respirations Cor - RRR  Neuor - A&O x 3, normal strength and gait.       Assessment & Plan:  Viral URI - no indication for antibiotics. Plan - supportive care           Revised RTW letter provided.

## 2012-01-06 NOTE — Assessment & Plan Note (Signed)
Wt Readings from Last 3 Encounters:  01/04/12 280 lb (127.007 kg)  01/01/12 279 lb (126.554 kg)  08/13/11 285 lb (129.275 kg)   Needs to continue to work on [employer] - smart food choices, PORTION SIZE CONTROL, continue regular exercise.

## 2012-01-06 NOTE — Assessment & Plan Note (Signed)
Poor control with lat A!C 9.2%  Plan - continue diet management           Continue metformin           Add Januvia 100 mg daily           Follow -up lab in 3 months

## 2012-01-06 NOTE — Assessment & Plan Note (Signed)
BP Readings from Last 3 Encounters:  01/04/12 160/92  01/01/12 148/82  08/13/11 178/110   Poorly controlled.   Plan - increase enalapril to 20 mg daily.

## 2012-01-06 NOTE — Assessment & Plan Note (Signed)
Testosterone level remains subnormal at 165. He may be a rapid metabolizor  Plan - increase replacement to 400 mg IM q 14 days.

## 2012-01-11 ENCOUNTER — Encounter: Payer: Self-pay | Admitting: Internal Medicine

## 2012-02-07 ENCOUNTER — Encounter: Payer: Self-pay | Admitting: Internal Medicine

## 2012-03-03 ENCOUNTER — Other Ambulatory Visit: Payer: Self-pay | Admitting: *Deleted

## 2012-03-03 MED ORDER — METFORMIN HCL 1000 MG PO TABS: ORAL_TABLET | ORAL | Status: DC

## 2012-03-03 NOTE — Telephone Encounter (Signed)
Done

## 2012-03-28 ENCOUNTER — Telehealth: Payer: Self-pay

## 2012-03-28 DIAGNOSIS — F528 Other sexual dysfunction not due to a substance or known physiological condition: Secondary | ICD-10-CM

## 2012-03-28 NOTE — Telephone Encounter (Signed)
[facility] notified to schedule appt

## 2012-03-28 NOTE — Telephone Encounter (Signed)
Patient called [name] stating the he has been on testoeterone replacement x 1 year with an increase in 11/2011. Patient report that he has not seen any difference and would like to request a referral to urology. Thanks

## 2012-03-29 NOTE — Telephone Encounter (Signed)
Pt informed and will expect a call from [facility] with appt date and time.

## 2012-05-01 ENCOUNTER — Ambulatory Visit: Payer: BC Managed Care – PPO | Attending: Specialist

## 2012-05-01 DIAGNOSIS — IMO0001 Reserved for inherently not codable concepts without codable children: Secondary | ICD-10-CM | POA: Insufficient documentation

## 2012-05-01 DIAGNOSIS — M25519 Pain in unspecified shoulder: Secondary | ICD-10-CM | POA: Insufficient documentation

## 2012-05-03 ENCOUNTER — Ambulatory Visit: Payer: BC Managed Care – PPO

## 2012-05-10 ENCOUNTER — Ambulatory Visit: Payer: BC Managed Care – PPO

## 2012-05-12 ENCOUNTER — Ambulatory Visit: Payer: BC Managed Care – PPO

## 2012-05-15 ENCOUNTER — Ambulatory Visit: Payer: BC Managed Care – PPO

## 2012-05-17 ENCOUNTER — Ambulatory Visit: Payer: BC Managed Care – PPO

## 2012-05-23 ENCOUNTER — Ambulatory Visit: Payer: BC Managed Care – PPO

## 2012-05-25 ENCOUNTER — Ambulatory Visit: Payer: BC Managed Care – PPO

## 2012-05-29 ENCOUNTER — Ambulatory Visit: Payer: BC Managed Care – PPO | Attending: Specialist

## 2012-05-29 DIAGNOSIS — M25519 Pain in unspecified shoulder: Secondary | ICD-10-CM | POA: Insufficient documentation

## 2012-05-29 DIAGNOSIS — IMO0001 Reserved for inherently not codable concepts without codable children: Secondary | ICD-10-CM | POA: Insufficient documentation

## 2012-07-10 ENCOUNTER — Telehealth: Payer: Self-pay | Admitting: Internal Medicine

## 2012-07-10 NOTE — Telephone Encounter (Signed)
Looks like A1C order is in system. He doesn't need an appointment for lab. Will let him know the results.

## 2012-07-10 NOTE — Telephone Encounter (Signed)
Caller: [name]/Patient; Phone: [phone]; Reason for Call: Raciel was supposed to schedule an appointment for an A1C lab test.  He is requesting to come in on Thursday 07/13/12.  Please follow up with patient regarding appointment need.

## 2012-07-11 NOTE — Telephone Encounter (Signed)
Pt advised of order 

## 2012-07-13 ENCOUNTER — Telehealth: Payer: Self-pay | Admitting: *Deleted

## 2012-07-13 ENCOUNTER — Other Ambulatory Visit (INDEPENDENT_AMBULATORY_CARE_PROVIDER_SITE_OTHER): Payer: BC Managed Care – PPO

## 2012-07-13 DIAGNOSIS — E119 Type 2 diabetes mellitus without complications: Secondary | ICD-10-CM

## 2012-07-13 LAB — HEMOGLOBIN A1C: Hgb A1c MFr Bld: 13.1 % — ABNORMAL HIGH (ref 4.6–6.5)

## 2012-07-13 NOTE — Telephone Encounter (Signed)
Lab order entered hgb A1C

## 2012-07-14 ENCOUNTER — Telehealth: Payer: Self-pay | Admitting: *Deleted

## 2012-07-14 NOTE — Telephone Encounter (Signed)
Patient not available . Wife  Para March took message of lab results and is scheduling an appt. With Dr. Barnetta Chapel for next week.

## 2012-07-14 NOTE — Telephone Encounter (Signed)
Message copied by Elnora Morrison on Fri Jul 14, 2012  1:22 PM ------      Message from: Jacques Navy      Created: Fri Jul 14, 2012 11:56 AM       Call patient A1C 13.1% - off the tracks. Last A1C was 7.8% will need OV next week.

## 2012-07-20 ENCOUNTER — Ambulatory Visit: Payer: BC Managed Care – PPO | Admitting: Internal Medicine

## 2012-07-25 ENCOUNTER — Encounter: Payer: Self-pay | Admitting: Internal Medicine

## 2012-07-25 ENCOUNTER — Ambulatory Visit (INDEPENDENT_AMBULATORY_CARE_PROVIDER_SITE_OTHER): Payer: BC Managed Care – PPO | Admitting: Internal Medicine

## 2012-07-25 VITALS — BP 156/82 | HR 73 | Temp 97.7°F | Resp 16 | Wt 269.0 lb

## 2012-07-25 DIAGNOSIS — E119 Type 2 diabetes mellitus without complications: Secondary | ICD-10-CM

## 2012-07-25 DIAGNOSIS — I10 Essential (primary) hypertension: Secondary | ICD-10-CM

## 2012-07-25 MED ORDER — GLIMEPIRIDE 4 MG PO TABS: 2.0000 mg | ORAL_TABLET | Freq: Every day | ORAL | Status: DC

## 2012-07-25 MED ORDER — ENALAPRIL MALEATE 10 MG PO TABS: 10.0000 mg | ORAL_TABLET | Freq: Every day | ORAL | Status: DC

## 2012-07-25 NOTE — Progress Notes (Signed)
  Subjective:    Patient ID: [name], male    DOB: 09-16-1956, 56 y.o.   MRN: [id number]  HPI [name] presents for management diabetes with last A1C 13.2%. He admits to stopping metformin due to side effect of fatigue and malaise. He retried a dose and had return of the same symptoms. He admits to dietary indescretion.  BP has been generally controlled but has not gone up. He had stopped taking his medication.  PMH, FamHx and SocHx reviewed for any changes and relevance.  Current Outpatient Prescriptions on File Prior to Visit  Medication Sig Dispense Refill  . aspirin 81 MG tablet Take 81 mg by mouth daily.        . Clindamycin-Benzoyl Per-Cleans (DUAC CS) external kit Apply topically 2 (two) times daily.        . diclofenac (VOLTAREN) 75 MG EC tablet Take 1 tablet (75 mg total) by mouth 2 (two) times daily with a meal. X 1 week, then as needed for pain  30 tablet  0  . enalapril (VASOTEC) 10 MG tablet Take 1 tablet (10 mg total) by mouth daily.  30 tablet  11  . metFORMIN (GLUCOPHAGE) 1000 MG tablet TAKE 1 TABLET BY MOUTH TWICE DAILY  180 tablet  1  . testosterone cypionate (DEPOTESTOTERONE CYPIONATE) 200 MG/ML injection 2 ml IM every 14 days.  10 mL  2  . glimepiride (AMARYL) 4 MG tablet Take 0.5 tablets (2 mg total) by mouth daily before breakfast.  30 tablet  3  . hydrochlorothiazide 25 MG tablet Take 1 tablet (25 mg total) by mouth daily.  30 tablet  3  . pioglitazone (ACTOS) 45 MG tablet Take 1 tablet (45 mg total) by mouth daily.  90 tablet  3     Review of Systems System review is negative for any constitutional, cardiac, pulmonary, GI or neuro symptoms or complaints other than as described in the HPI.      Objective:   Physical Exam Filed Vitals:   07/25/12 1415  BP: 156/82  Pulse: 73  Temp: 97.7 F (36.5 C)  Resp: 16   Gen'l- large framed AA man in no distress HEENT- everted lower left eyelid Cor- RRR Pulm - normal respirations Neuro - A&O x 3,  cognition normal,        Assessment & Plan:

## 2012-07-25 NOTE — Patient Instructions (Addendum)
Diabetes - you know the facts and we have reviewed them again today. It is a devastating disease that can effect all major organ systems over time. The goal is to reduce the risk of end-organ damage.  Plan  Follow the diabetic diet! Three squares a day and a bedtime snack. Power bars are good snacks if the don't have sugar.   Be sure you adjust your calorie intake if you have periods of increased exertion/work.  Start Amaryl (generic) 4 mg tablet -take 1/2 tablet (2 mg ) once a day.  If you feel cold, clammy, weak, nausea - check you blood sugar to see if it is too low.  If you need to meet with a dietician (again) we can arrange for that.  Blood pressure - too high today. The goal is 120 /80 to reduce the risk of events. The enalapril (ACE-I ) also protects the kidney and stabilizes plaque in vessels.  Plan  Restart the enalapril   \If ever you have trouble with medication call so we can make adjustments or change drugs.

## 2012-07-26 NOTE — Assessment & Plan Note (Signed)
BP Readings from Last 3 Encounters:  07/25/12 156/82  01/04/12 160/92  01/01/12 148/82   Poor control. Stressed the importance of good control and the long term consequences of untreated HTN, especially in a diabetic  Plan Resume medications.

## 2012-07-26 NOTE — Assessment & Plan Note (Addendum)
Very high A1C. Patient had stopped metformin and had been indiscrete with his diet. Lectured him in detail as to the long term effects of diabetes and the need for treatment.  Plan D/c/ metformin  Continue Actos  Add Amaryl 2 mg qD  Stressed the importance of diet  Educated as to warning signs of hypoglycemia, provided glucometer with instructions for use.  (greater than 50% of 30 min visit spent on education and counseling)   F/u 1 month

## 2012-08-31 ENCOUNTER — Ambulatory Visit: Payer: BC Managed Care – PPO | Admitting: Internal Medicine

## 2012-09-11 ENCOUNTER — Encounter: Payer: Self-pay | Admitting: Internal Medicine

## 2012-09-11 ENCOUNTER — Ambulatory Visit (INDEPENDENT_AMBULATORY_CARE_PROVIDER_SITE_OTHER): Payer: BC Managed Care – PPO | Admitting: Internal Medicine

## 2012-09-11 VITALS — BP 132/90 | HR 87 | Temp 97.6°F | Resp 16 | Wt 271.0 lb

## 2012-09-11 DIAGNOSIS — Z0000 Encounter for general adult medical examination without abnormal findings: Secondary | ICD-10-CM

## 2012-09-11 DIAGNOSIS — Z125 Encounter for screening for malignant neoplasm of prostate: Secondary | ICD-10-CM

## 2012-09-11 DIAGNOSIS — I10 Essential (primary) hypertension: Secondary | ICD-10-CM

## 2012-09-11 DIAGNOSIS — E119 Type 2 diabetes mellitus without complications: Secondary | ICD-10-CM

## 2012-09-11 NOTE — Progress Notes (Signed)
  Subjective:    Patient ID: [name], male    DOB: [birth year], 56 y.o.   MRN: [id number]  HPI Mr. Alfrey presents for blood pressure check. He has been adherent to his medical regimen. He reports that he is feeling good. No c/p, epistaxis, light-headedness. He reports that is CBGs have been better in the 120-150 range.  PMH, FamHx and SocHx reviewed for any changes and relevance.  Current Outpatient Prescriptions on File Prior to Visit  Medication Sig Dispense Refill  . enalapril (VASOTEC) 10 MG tablet Take 1 tablet (10 mg total) by mouth daily.  30 tablet  11  . glimepiride (AMARYL) 4 MG tablet Take 0.5 tablets (2 mg total) by mouth daily before breakfast.  30 tablet  3  . aspirin 81 MG tablet Take 81 mg by mouth daily.        . Clindamycin-Benzoyl Per-Cleans (DUAC CS) external kit Apply topically 2 (two) times daily.        . hydrochlorothiazide 25 MG tablet Take 1 tablet (25 mg total) by mouth daily.  30 tablet  3  . metFORMIN (GLUCOPHAGE) 1000 MG tablet TAKE 1 TABLET BY MOUTH TWICE DAILY  180 tablet  1  . pioglitazone (ACTOS) 45 MG tablet Take 1 tablet (45 mg total) by mouth daily.  90 tablet  3  . testosterone cypionate (DEPOTESTOTERONE CYPIONATE) 200 MG/ML injection 2 ml IM every 14 days.  10 mL  2      Review of Systems System review is negative for any constitutional, cardiac, pulmonary, GI or neuro symptoms or complaints other than as described in the HPI.     Objective:   Physical Exam Filed Vitals:   09/11/12 1425  BP: 132/90  Pulse: 87  Temp: 97.6 F (36.4 C)  Resp: 16   Wt Readings from Last 3 Encounters:  09/11/12 271 lb (122.925 kg)  07/25/12 269 lb (122.018 kg)  01/04/12 280 lb (127.007 kg)   BP Readings from Last 3 Encounters:  09/11/12 132/90  07/25/12 156/82  01/04/12 160/92   Gen'l- WNWD AA man Cor- RRR Pulm - normal respirations.       Assessment & Plan:  Health maintenance - he will return for lab Nov 18th with a CPX to follow in  several days.

## 2012-09-12 NOTE — Assessment & Plan Note (Signed)
Better control.   Plan- continue present medications.

## 2012-09-12 NOTE — Assessment & Plan Note (Signed)
He reports better adherence to medications and diet.  Plan Return for A1C with recommendations to follow

## 2012-10-13 ENCOUNTER — Other Ambulatory Visit (INDEPENDENT_AMBULATORY_CARE_PROVIDER_SITE_OTHER): Payer: BC Managed Care – PPO

## 2012-10-13 DIAGNOSIS — E119 Type 2 diabetes mellitus without complications: Secondary | ICD-10-CM

## 2012-10-13 DIAGNOSIS — Z125 Encounter for screening for malignant neoplasm of prostate: Secondary | ICD-10-CM

## 2012-10-13 DIAGNOSIS — Z0000 Encounter for general adult medical examination without abnormal findings: Secondary | ICD-10-CM

## 2012-10-13 LAB — HEMOGLOBIN A1C: Hgb A1c MFr Bld: 12 % — ABNORMAL HIGH (ref 4.6–6.5)

## 2012-10-13 LAB — CBC WITH DIFFERENTIAL/PLATELET
Basophils Absolute: 0 10*3/uL (ref 0.0–0.1)
Basophils Relative: 0.3 % (ref 0.0–3.0)
Eosinophils Absolute: 0.2 10*3/uL (ref 0.0–0.7)
Eosinophils Relative: 2.7 % (ref 0.0–5.0)
HCT: 44.4 % (ref 39.0–52.0)
Hemoglobin: 13.8 g/dL (ref 13.0–17.0)
Lymphocytes Relative: 36.8 % (ref 12.0–46.0)
Lymphs Abs: 2.5 10*3/uL (ref 0.7–4.0)
MCHC: 31.1 g/dL (ref 30.0–36.0)
MCV: 75 fl — ABNORMAL LOW (ref 78.0–100.0)
Monocytes Absolute: 0.6 10*3/uL (ref 0.1–1.0)
Monocytes Relative: 8.1 % (ref 3.0–12.0)
Neutro Abs: 3.5 10*3/uL (ref 1.4–7.7)
Neutrophils Relative %: 52.1 % (ref 43.0–77.0)
Platelets: 162 10*3/uL (ref 150.0–400.0)
RBC: 5.92 Mil/uL — ABNORMAL HIGH (ref 4.22–5.81)
RDW: 14.5 % (ref 11.5–14.6)
WBC: 6.8 10*3/uL (ref 4.5–10.5)

## 2012-10-13 LAB — COMPREHENSIVE METABOLIC PANEL
ALT: 50 U/L (ref 0–53)
AST: 34 U/L (ref 0–37)
Albumin: 4.4 g/dL (ref 3.5–5.2)
Alkaline Phosphatase: 61 U/L (ref 39–117)
BUN: 12 mg/dL (ref 6–23)
CO2: 29 mEq/L (ref 19–32)
Calcium: 9.5 mg/dL (ref 8.4–10.5)
Chloride: 105 mEq/L (ref 96–112)
Creatinine, Ser: 1 mg/dL (ref 0.4–1.5)
GFR: 99.57 mL/min (ref >60.00)
Glucose, Bld: 278 mg/dL — ABNORMAL HIGH (ref 70–99)
Potassium: 4.5 mEq/L (ref 3.5–5.1)
Sodium: 139 mEq/L (ref 135–145)
Total Bilirubin: 0.6 mg/dL (ref 0.3–1.2)
Total Protein: 7.9 g/dL (ref 6.0–8.3)

## 2012-10-13 LAB — LIPID PANEL
Cholesterol: 158 mg/dL (ref 0–200)
HDL: 35.7 mg/dL — ABNORMAL LOW (ref >39.00)
LDL Cholesterol: 110 mg/dL — ABNORMAL HIGH (ref 0–99)
Total CHOL/HDL Ratio: 4
Triglycerides: 62 mg/dL (ref 0.0–149.0)
VLDL: 12.4 mg/dL (ref 0.0–40.0)

## 2012-10-13 LAB — HEPATIC FUNCTION PANEL
ALT: 50 U/L (ref 0–53)
AST: 34 U/L (ref 0–37)
Albumin: 4.4 g/dL (ref 3.5–5.2)
Alkaline Phosphatase: 61 U/L (ref 39–117)
Bilirubin, Direct: 0.1 mg/dL (ref 0.0–0.3)
Total Bilirubin: 0.6 mg/dL (ref 0.3–1.2)
Total Protein: 7.9 g/dL (ref 6.0–8.3)

## 2012-10-13 LAB — PSA: PSA: 0.49 ng/mL (ref 0.10–4.00)

## 2012-10-19 ENCOUNTER — Other Ambulatory Visit: Payer: BC Managed Care – PPO

## 2012-10-19 ENCOUNTER — Encounter: Payer: Self-pay | Admitting: Internal Medicine

## 2012-10-19 ENCOUNTER — Ambulatory Visit (INDEPENDENT_AMBULATORY_CARE_PROVIDER_SITE_OTHER): Payer: BC Managed Care – PPO | Admitting: Internal Medicine

## 2012-10-19 VITALS — BP 142/72 | HR 98 | Temp 98.8°F | Resp 12 | Ht 75.0 in | Wt 269.1 lb

## 2012-10-19 DIAGNOSIS — Z0000 Encounter for general adult medical examination without abnormal findings: Secondary | ICD-10-CM

## 2012-10-19 DIAGNOSIS — F528 Other sexual dysfunction not due to a substance or known physiological condition: Secondary | ICD-10-CM

## 2012-10-19 DIAGNOSIS — E119 Type 2 diabetes mellitus without complications: Secondary | ICD-10-CM

## 2012-10-19 DIAGNOSIS — E663 Overweight: Secondary | ICD-10-CM

## 2012-10-19 DIAGNOSIS — I10 Essential (primary) hypertension: Secondary | ICD-10-CM

## 2012-10-19 DIAGNOSIS — IMO0001 Reserved for inherently not codable concepts without codable children: Secondary | ICD-10-CM

## 2012-10-19 LAB — HIGH SENSITIVITY CRP: CRP, High Sensitivity: 3.24 mg/dL (ref 0.000–5.000)

## 2012-10-19 MED ORDER — METFORMIN HCL 1000 MG PO TABS: ORAL_TABLET | ORAL | Status: DC

## 2012-10-19 NOTE — Progress Notes (Signed)
Subjective:    Patient ID: [name], male    DOB: 1956-10-04, 56 y.o.   MRN: [id number]  HPI [name] presents for an annual physical. He has seen DR. Nesi - testosterone has been low and he has been doubled on his testosterone. His A1C is 13%. He has been on amaryl and stopped the metformin due to possible weakness. There has been no improvement in his symptoms off metformin. He reports that he has been very weak and that he can barely make it through the day at work and doesn't know if he can continue working. General labs were good. PSA normal. In August '12 he did have an elevated total CK at 733. This has not been rechecked.  Past Medical History  Diagnosis Date  . ABNORMAL CHEST XRAY 2009  . DIABETES MELLITUS, TYPE II   . ERECTILE DYSFUNCTION, MILD   . HYPERTENSION   . Overweight   . PSEUDOFOLLICULITIS BARBAE   . PULMONARY NODULE, RIGHT UPPER LOBE 02/10/2009  . Biceps tendon rupture   . Hypogonadism, male    Past Surgical History  Procedure Date  . Nasal polyp surgery    Family History  Problem Relation Age of Onset  . Hypertension Mother   . Diabetes Mother   . Cancer Brother     Pancreatic  . Kidney failure Father   . Coronary artery disease Neg Hx    History   Social History  . Marital Status: Married    Spouse Name: N/A    Number of Children: 2  . Years of Education: N/A   Occupational History  . WORKS [employer]    Social History Main Topics  . Smoking status: Former Smoker    Quit date: 10/20/1991  . Smokeless tobacco: Never Used  . Alcohol Use: No  . Drug Use: No  . Sexually Active: Yes -- Male partner(s)   Other Topics Concern  . 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 2 daughters- '80, '84- 2 grandchildren-'99,  '06Regular exercise: walk 3-4 x a week; lifting light weightsCaffeine use: none    Current Outpatient Prescriptions  on File Prior to Visit  Medication Sig Dispense Refill  . enalapril (VASOTEC) 10 MG tablet Take 1 tablet (10 mg total) by mouth daily.  30 tablet  11  . glimepiride (AMARYL) 4 MG tablet Take 0.5 tablets (2 mg total) by mouth daily before breakfast.  30 tablet  3  . Testosterone (ANDROGEL PUMP TD) Place 1 applicator onto the skin daily.      Marland Kitchen aspirin 81 MG tablet Take 81 mg by mouth daily.        . Clindamycin-Benzoyl Per-Cleans (DUAC CS) external kit Apply topically 2 (two) times daily.        . pioglitazone (ACTOS) 45 MG tablet Take 1 tablet (45 mg total) by mouth daily.  90 tablet  3  . testosterone cypionate (DEPOTESTOTERONE CYPIONATE) 200 MG/ML injection 2 ml IM every 14 days.  10 mL  2      Review of Systems Constitutional:  Negative for fever, chills, activity change and unexpected weight change.  HEENT:  Negative for hearing loss, ear pain, congestion, neck stiffness and postnasal drip. Negative for sore throat or swallowing problems. Negative for dental complaints.   Eyes: Negative for vision loss or change in visual acuity.  Respiratory: Negative for chest  tightness and wheezing. Negative for DOE.   Cardiovascular: Negative for chest pain or palpitations. No decreased exercise tolerance Gastrointestinal: No change in bowel habit. No bloating or gas. No reflux or indigestion Genitourinary: Negative for urgency, frequency, flank pain and difficulty urinating.  Musculoskeletal: Negative for myalgias, back pain, arthralgias and gait problem.  Neurological: Negative for dizziness, tremors, weakness and headaches.  Hematological: Negative for adenopathy.  Psychiatric/Behavioral: Negative for behavioral problems and dysphoric mood.       Objective:   Physical Exam Filed Vitals:   10/19/12 1030  BP: 142/72  Pulse: 98  Temp: 98.8 F (37.1 C)  Resp: 12   Wt Readings from Last 3 Encounters:  10/19/12 269 lb 1.3 oz (122.054 kg)  09/11/12 271 lb (122.925 kg)  07/25/12 269 lb  (122.018 kg)   Gen'l: Well nourished well developed large framed AA male in no acute distress  HEENT: Head: Normocephalic and atraumatic. Right Ear: External ear normal. EAC/TM nl. Left Ear: External ear normal.  EAC/TM nl. Nose: Nose normal. Mouth/Throat: Oropharynx is clear and moist. Dentition - native, in good repair. No buccal or palatal lesions. Posterior pharynx clear. Eyes: Conjunctivae and sclera clear. EOM intact. Pupils are equal, round, and reactive to light. Right eye exhibits no discharge. Left eye exhibits no discharge. Neck: Normal range of motion. Neck supple. No JVD present. No tracheal deviation present. No thyromegaly present.  Cardiovascular: Normal rate, regular rhythm, no gallop, no friction rub, no murmur heard.      Quiet precordium. 2+ radial and DP pulses . No carotid bruits Pulmonary/Chest: Effort normal. No respiratory distress or increased WOB, no wheezes, no rales. No chest wall deformity or CVAT. Abdomen: Soft. Bowel sounds are normal in all quadrants. He exhibits no distension, no tenderness, no rebound or guarding, No heptosplenomegaly  Genitourinary:  deferred to GU Musculoskeletal: Normal range of motion. He exhibits no edema but has mild diffuse muscular tenderness.       Small and large joints without redness, synovial thickening or deformity. Full range of motion preserved about all small, median and large joints.  Lymphadenopathy:    He has no cervical or supraclavicular adenopathy.  Neurological: He is alert and oriented to person, place, and time. CN II-XII intact. DTRs 2+ and symmetrical biceps, radial and patellar tendons. Cerebellar function normal with no tremor, rigidity, normal gait and station.  Skin: Skin is warm and dry. No rash noted. No erythema.  Psychiatric: He has a normal mood and affect. His behavior is normal. Thought content normal.   Lab Results  Component Value Date   WBC 6.8 10/13/2012   HGB 13.8 10/13/2012   HCT 44.4 10/13/2012    PLT 162.0 10/13/2012   GLUCOSE 278* 10/13/2012   CHOL 158 10/13/2012   TRIG 62.0 10/13/2012   HDL 35.70* 10/13/2012   LDLCALC 110* 10/13/2012        ALT 50 10/13/2012   AST 34 10/13/2012        NA 139 10/13/2012   K 4.5 10/13/2012   CL 105 10/13/2012   CREATININE 1.0 10/13/2012   BUN 12 10/13/2012   CO2 29 10/13/2012   TSH 1.54 09/23/2009   PSA 0.49 10/13/2012   HGBA1C 12.0 Repeated and verified X2.* 10/13/2012        CRP                      3.24  - nl  10/19/2012       ANA                       Negative     10/19/2012       RF   <10 - negative     10/19/2012       CK - total  pending         Assessment & Plan:

## 2012-10-19 NOTE — Patient Instructions (Addendum)
Diabetes - out of control with A1C of 11% Plan - stop the glimeperide  Resume the metformin at 1000 mg twice a day  Follow a no sugar low carb diet  Recheck of A1C in 3 months  Weakness and fatigue - will do additional testing to look for muscle inflammation type disease. Until we know more about what is going on you are written out of work.  You will receive a full report in the mail along with the lab results.  HAVE A HAPPY THANKSGIVING - there are no calories on that day.

## 2012-10-20 LAB — ANA: Anti Nuclear Antibody(ANA): NEGATIVE

## 2012-10-20 LAB — RHEUMATOID FACTOR: Rhuematoid fact SerPl-aCnc: <10 IU/mL (ref <=14)

## 2012-10-22 DIAGNOSIS — Z0001 Encounter for general adult medical examination with abnormal findings: Secondary | ICD-10-CM | POA: Insufficient documentation

## 2012-10-22 DIAGNOSIS — IMO0001 Reserved for inherently not codable concepts without codable children: Secondary | ICD-10-CM | POA: Insufficient documentation

## 2012-10-22 DIAGNOSIS — Z0000 Encounter for general adult medical examination without abnormal findings: Secondary | ICD-10-CM | POA: Insufficient documentation

## 2012-10-22 MED ORDER — FUROSEMIDE 20 MG PO TABS: 20.0000 mg | ORAL_TABLET | Freq: Every day | ORAL | Status: DC

## 2012-10-22 NOTE — Assessment & Plan Note (Signed)
BP Readings from Last 3 Encounters:  10/19/12 142/72  09/11/12 132/90  07/25/12 156/82   Borderline control.   Plan Continue Enalapril 10 mg daily  Add furosemide 20 mg daily (Rx to pharmacy)

## 2012-10-22 NOTE — Assessment & Plan Note (Signed)
Interval history remarkable for persistent hypogonadism, DM out of control, diffuse muscle aches and weakness. Physical exam is unremarkable. Labs from Nov 15th reviewed- except for A1C and serum glucose results are no far from goal. LDL at 110 is above goal of 100 or less and can be addressed with diet management as noted under Obesity I. He is current with colorectal cancer screening and for prostate screening per Dr. Brunilda Payor. Immunizations - current with tetanus; he is a candidate for pneumonia vaccine.   In summary - a nice man who has out of control diabetes and undiagnosed myalgias/weakness currently being evaluated. He will have follow up A1C in Feb '14 and additional evaluation for myalgias based on pending results.

## 2012-10-22 NOTE — Assessment & Plan Note (Addendum)
Last A1C 12% - diabetes remains out of control.   Plan No sugar, low carb diet and portion size control  D/C glimeperide   Resume metformin 1,000 mg twice a day  (no change in muscles aches off medication)  Repeat A1C in 3 months - if not down will add second agent, e.g DDP4 or thioglitazone

## 2012-10-22 NOTE — Assessment & Plan Note (Signed)
Obesity - a major risk factor that affects diabetes, hypertension, risk for heart disease.  Plan Weight management: smart food choices - no sugar, low fat; PORTION SIZE CONTROL - use the hand as a guide; regular aerobic exercise  Target weight 220 lbs; goal - to loose 1-2 lbs/month (2 - 4 year project).

## 2012-10-22 NOTE — Assessment & Plan Note (Signed)
Persistent ED and hypogonadism now followed by Dr. Brunilda Payor. He does not see improvement with testosterone shots and does get response to Viagra, etc.  Plan Continue testosterone replacement under the guidance of Dr. Brunilda Payor  Consider Cavernous injections - can discuss with Dr. Brunilda Payor

## 2012-10-22 NOTE — Assessment & Plan Note (Signed)
Continued myalgia and weakness/fatigue and reports that he cannot do his job due to symptoms. Previous CK total was elevated at 733.   Plan Further evaluation: serology: CRP, RF, ANA, repeat CK total  Written out of work for 14 days pending evaluation.  Addendum: RF - <10, CRP 3.4, ANA - negative. CK pending.  If CK remains elevated will refer to rheumatology

## 2012-11-06 ENCOUNTER — Telehealth: Payer: Self-pay | Admitting: *Deleted

## 2012-11-06 NOTE — Telephone Encounter (Signed)
Pt saw Dr. Debby Bud on 10/19/2012 and complained about fatigue and about not being able to get through work day performing regular duties. Pt was written out of work for 2 weeks until lab work came back, specifically  CK. Pt is calling regarding findings of results of labs because his work excuse runs out this week-please advise in Dr. Debby Bud' absence.

## 2012-11-07 NOTE — Telephone Encounter (Signed)
Appointment scheduled 11/23/2012 at 9:30am.

## 2012-11-07 NOTE — Telephone Encounter (Signed)
Left message for pt to callback office.  

## 2012-11-07 NOTE — Telephone Encounter (Signed)
Labs OK CK was not done F/u w/Dr Norins pls Thx

## 2012-11-07 NOTE — Telephone Encounter (Signed)
Pt informed of results and MD's advisement, transferred to scheduler to schedule F/U appointment with Dr. Debby Bud.

## 2012-11-23 ENCOUNTER — Ambulatory Visit: Payer: BC Managed Care – PPO | Admitting: Internal Medicine

## 2013-03-13 DIAGNOSIS — Z0279 Encounter for issue of other medical certificate: Secondary | ICD-10-CM

## 2013-04-19 ENCOUNTER — Telehealth: Payer: Self-pay

## 2013-04-19 DIAGNOSIS — E119 Type 2 diabetes mellitus without complications: Secondary | ICD-10-CM

## 2013-04-19 NOTE — Telephone Encounter (Signed)
Phone call from pt requesting an order be placed so he can come have his a1c re-checked. Per Dr Debby Bud last office note patient is to have this rechecked. Order placed. Pt aware via voicemail.

## 2013-04-20 ENCOUNTER — Other Ambulatory Visit (INDEPENDENT_AMBULATORY_CARE_PROVIDER_SITE_OTHER): Payer: BC Managed Care – PPO

## 2013-04-20 DIAGNOSIS — E119 Type 2 diabetes mellitus without complications: Secondary | ICD-10-CM

## 2013-04-20 LAB — HEMOGLOBIN A1C: Hgb A1c MFr Bld: 11.3 % — ABNORMAL HIGH (ref 4.6–6.5)

## 2013-04-25 ENCOUNTER — Telehealth: Payer: Self-pay

## 2013-04-25 ENCOUNTER — Other Ambulatory Visit: Payer: Self-pay | Admitting: Internal Medicine

## 2013-04-25 DIAGNOSIS — E119 Type 2 diabetes mellitus without complications: Secondary | ICD-10-CM

## 2013-04-25 NOTE — Telephone Encounter (Signed)
Opened in error

## 2013-04-25 NOTE — Telephone Encounter (Signed)
Left message on mobile # to call back. 

## 2013-04-25 NOTE — Telephone Encounter (Signed)
Message copied by Noreene Larsson on Wed Apr 25, 2013  8:15 AM ------      Message from: Illene Regulus E      Created: Wed Apr 25, 2013  5:23 AM       Please call patient: A1C remains too high at 11.3%.       Plan - will refer to endocrinology ------

## 2013-04-30 ENCOUNTER — Ambulatory Visit: Payer: BC Managed Care – PPO | Admitting: Endocrinology

## 2013-05-04 ENCOUNTER — Ambulatory Visit (INDEPENDENT_AMBULATORY_CARE_PROVIDER_SITE_OTHER): Payer: BC Managed Care – PPO | Admitting: Endocrinology

## 2013-05-04 ENCOUNTER — Encounter: Payer: Self-pay | Admitting: Endocrinology

## 2013-05-04 VITALS — BP 140/80 | HR 80 | Ht 75.0 in | Wt 272.0 lb

## 2013-05-04 DIAGNOSIS — E119 Type 2 diabetes mellitus without complications: Secondary | ICD-10-CM

## 2013-05-04 MED ORDER — GLUCOSE BLOOD VI STRP: 1.0000 | ORAL_STRIP | Freq: Two times a day (BID) | Status: DC

## 2013-05-04 NOTE — Patient Instructions (Addendum)
good diet and exercise habits significanly improve the control of your diabetes.  please let me know if you wish to be referred to a dietician.  high blood sugar is very risky to your health.  you should see an eye doctor every year.  You are at higher than average risk for pneumonia and hepatitis-B.  You should be vaccinated against both.   controlling your blood pressure and cholesterol drastically reduces the damage diabetes does to your body.  this also applies to quitting smoking.  please discuss these with your doctor.  you should take an aspirin every day, unless you have been advised by a doctor not to. check your blood sugar twice a day.  vary the time of day when you check, between before the 3 meals, and at bedtime.  also check if you have symptoms of your blood sugar being too high or too low.  please keep a record of the readings and bring it to your next appointment here.  please call us sooner if your blood sugar goes below 70, or if you have a lot of readings over 200. Please start "levemir," 10 units each morning. Please come back for a follow-up appointment in 2 weeks.

## 2013-05-04 NOTE — Progress Notes (Signed)
Subjective:    Patient ID: [name], male    DOB: 12/09/1955, 57 y.o.   MRN: [id number]  HPI pt states 10 years h/o dm.  He has mild if any neuropathy of the lower extremities; no associated chronic complications.  he has never been on insulin.  pt says his diet is poor, but exercise is very good.  He does not check cbg's.  He is very hesitant to take insulin.   Past Medical History  Diagnosis Date  . ABNORMAL CHEST XRAY 2009  . DIABETES MELLITUS, TYPE II   . ERECTILE DYSFUNCTION, MILD   . HYPERTENSION   . Overweight(278.02)   . PSEUDOFOLLICULITIS BARBAE   . PULMONARY NODULE, RIGHT UPPER LOBE 02/10/2009  . Biceps tendon rupture   . Hypogonadism, male     Past Surgical History  Procedure Laterality Date  . Nasal polyp surgery      History   Social History  . Marital Status: Married    Spouse Name: N/A    Number of Children: 2  . Years of Education: N/A   Occupational History  . WORKS [employer]    Social History Main Topics  . Smoking status: Former Smoker    Quit date: 10/20/1991  . Smokeless tobacco: Never Used  . Alcohol Use: No  . Drug Use: No  . Sexually Active: Yes -- Male partner(s)   Other Topics Concern  . 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 2 daughters- '80, '84- 2 grandchildren-'99,  '06      Regular exercise: walk 3-4 x a week; lifting light weights   Caffeine use: none    Current Outpatient Prescriptions on File Prior to Visit  Medication Sig Dispense Refill  . aspirin 81 MG tablet Take 81 mg by mouth daily.        . Clindamycin-Benzoyl Per-Cleans (DUAC CS) external kit Apply topically 2 (two) times daily.        . enalapril (VASOTEC) 10 MG tablet Take 1 tablet (10 mg total) by mouth daily.  30 tablet  11  . furosemide (LASIX) 20 MG tablet Take 1 tablet (20 mg total) by mouth daily.  30 tablet  3  . glimepiride (AMARYL) 4 MG  tablet Take 0.5 tablets (2 mg total) by mouth daily before breakfast.  30 tablet  3  . metFORMIN (GLUCOPHAGE) 1000 MG tablet TAKE 1 TABLET BY MOUTH TWICE DAILY  60 tablet  11  . Testosterone (ANDROGEL PUMP TD) Place 1 applicator onto the skin daily.      Marland Kitchen testosterone cypionate (DEPOTESTOTERONE CYPIONATE) 200 MG/ML injection 2 ml IM every 14 days.  10 mL  2  . pioglitazone (ACTOS) 45 MG tablet Take 1 tablet (45 mg total) by mouth daily.  90 tablet  3   No current facility-administered medications on file prior to visit.    No Known Allergies  Family History  Problem Relation Age of Onset  . Hypertension Mother   . Diabetes Mother   . Cancer Brother     Pancreatic  . Kidney failure Father   . Coronary artery disease Neg Hx     BP 140/80  Pulse 80  Ht 6\' 3"  (1.905 m)  Wt 272 lb (123.378 kg)  BMI 34 kg/m2  SpO2 98%  Review of Systems denies weight loss, blurry vision, headache, chest pain, sob, n/v, urinary  frequency, cramps, excessive diaphoresis, memory loss, depression, hypoglycemia, rhinorrhea, and easy bruising.      Objective:   Physical Exam VS: see vs page GEN: no distress HEAD: head: no deformity eyes: no periorbital swelling, no proptosis external nose and ears are normal mouth: no lesion seen NECK: supple, thyroid is not enlarged CHEST WALL: no deformity LUNGS: clear to auscultation BREASTS:  No gynecomastia CV: reg rate and rhythm, no murmur ABD: abdomen is soft, nontender.  no hepatosplenomegaly.  not distended.  no hernia MUSCULOSKELETAL: muscle bulk and strength are grossly normal.  no obvious joint swelling.  gait is normal and steady PULSES: no carotid bruit NEURO:  cn 2-12 grossly intact.   readily moves all 4's.   SKIN:  Normal texture and temperature.  No rash or suspicious lesion is visible.   NODES:  None palpable at the neck PSYCH: alert, oriented x3.  Does not appear anxious nor depressed.   Lab Results  Component Value Date   HGBA1C 11.3*  04/20/2013      Assessment & Plan:  DM: This insulin regimen was chosen from multiple options, due to pt's hesitation to take insulin.  The benefits of glycemic control must be weighed against the risks of hypoglycemia.  i demonstrated insulin pen injection technique. Obesity.  This complicates the rx of DM HTN, with possible situational component

## 2013-05-18 ENCOUNTER — Ambulatory Visit: Payer: BC Managed Care – PPO | Admitting: Endocrinology

## 2013-05-25 ENCOUNTER — Ambulatory Visit (INDEPENDENT_AMBULATORY_CARE_PROVIDER_SITE_OTHER): Payer: BC Managed Care – PPO | Admitting: Endocrinology

## 2013-05-25 ENCOUNTER — Encounter: Payer: Self-pay | Admitting: Endocrinology

## 2013-05-25 VITALS — BP 132/84 | HR 78 | Ht 75.0 in | Wt 269.0 lb

## 2013-05-25 DIAGNOSIS — E119 Type 2 diabetes mellitus without complications: Secondary | ICD-10-CM

## 2013-05-25 MED ORDER — GLUCOSE BLOOD VI STRP: 1.0000 | ORAL_STRIP | Freq: Two times a day (BID) | Status: DC

## 2013-05-25 MED ORDER — ACCU-CHEK AVIVA DEVI: Status: AC

## 2013-05-25 MED ORDER — INSULIN DETEMIR 100 UNIT/ML FLEXPEN: 10.0000 [IU] | PEN_INJECTOR | SUBCUTANEOUS | Status: DC

## 2013-05-25 NOTE — Progress Notes (Signed)
Subjective:    Patient ID: [name], male    DOB: 20-Mar-1956, 57 y.o.   MRN: [id number]  HPI pt returns for f/u of insulin-requiring DM (dx'ed 2004; he has mild if any neuropathy of the lower extremities; no associated chronic complications).  He takes levemir qd as rx'ed.  He does not check cbg's.  He says he could not afford the strips, and doesn't understand how to use the meter anyway.  pt states he feels well in general. Past Medical History  Diagnosis Date  . ABNORMAL CHEST XRAY 2009  . DIABETES MELLITUS, TYPE II   . ERECTILE DYSFUNCTION, MILD   . HYPERTENSION   . Overweight(278.02)   . PSEUDOFOLLICULITIS BARBAE   . PULMONARY NODULE, RIGHT UPPER LOBE 02/10/2009  . Biceps tendon rupture   . Hypogonadism, male     Past Surgical History  Procedure Laterality Date  . Nasal polyp surgery      History   Social History  . Marital Status: Married    Spouse Name: N/A    Number of Children: 2  . Years of Education: N/A   Occupational History  . WORKS [employer]    Social History Main Topics  . Smoking status: Former Smoker    Quit date: 10/20/1991  . Smokeless tobacco: Never Used  . Alcohol Use: No  . Drug Use: No  . Sexually Active: Yes -- Male partner(s)   Other Topics Concern  . 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 2 daughters- '80, '84- 2 grandchildren-'99,  '06      Regular exercise: walk 3-4 x a week; lifting light weights   Caffeine use: none    Current Outpatient Prescriptions on File Prior to Visit  Medication Sig Dispense Refill  . aspirin 81 MG tablet Take 81 mg by mouth daily.        . Clindamycin-Benzoyl Per-Cleans (DUAC CS) external kit Apply topically 2 (two) times daily.        . enalapril (VASOTEC) 10 MG tablet Take 1 tablet (10 mg total) by mouth daily.  30 tablet  11  . furosemide (LASIX) 20 MG tablet Take 1 tablet (20 mg  total) by mouth daily.  30 tablet  3  . glimepiride (AMARYL) 4 MG tablet Take 0.5 tablets (2 mg total) by mouth daily before breakfast.  30 tablet  3  . metFORMIN (GLUCOPHAGE) 1000 MG tablet TAKE 1 TABLET BY MOUTH TWICE DAILY  60 tablet  11  . Testosterone (ANDROGEL PUMP TD) Place 1 applicator onto the skin daily.      Marland Kitchen testosterone cypionate (DEPOTESTOTERONE CYPIONATE) 200 MG/ML injection 2 ml IM every 14 days.  10 mL  2  . pioglitazone (ACTOS) 45 MG tablet Take 1 tablet (45 mg total) by mouth daily.  90 tablet  3   No current facility-administered medications on file prior to visit.    No Known Allergies  Family History  Problem Relation Age of Onset  . Hypertension Mother   . Diabetes Mother   . Cancer Brother     Pancreatic  . Kidney failure Father   . Coronary artery disease Neg Hx     BP 132/84  Pulse 78  Ht 6\' 3"  (1.905 m)  Wt 269 lb (122.018 kg)  BMI 33.62 kg/m2  SpO2 98%  Review of Systems denies hypoglycemia and weight change  Objective:   Physical Exam VITAL SIGNS:  See vs page GENERAL: no distress SKIN:  Insulin injection sites at the anterior abdomen are normal.       Assessment & Plan:  DM: This insulin regimen was chosen from multiple options, for its simplicity.  The benefits of glycemic control must be weighed against the risks of hypoglycemia.  therapy limited by lack of cbg information.

## 2013-05-25 NOTE — Patient Instructions (Addendum)
check your blood sugar twice a day.  vary the time of day when you check, between before the 3 meals, and at bedtime.  also check if you have symptoms of your blood sugar being too high or too low.  please keep a record of the readings and bring it to your next appointment here.  please call us sooner if your blood sugar goes below 70, or if you have a lot of readings over 200. i have sent a prescription to your pharmacy, for a different meter and strips, that your insurance will like.  Please continue "levemir," 10 units each morning.   Refer to a diabetes education specialist.  you will receive a phone call, about a day and time for an appointment Please come back for a follow-up appointment in 2-4 weeks.

## 2013-05-30 ENCOUNTER — Other Ambulatory Visit: Payer: Self-pay | Admitting: Internal Medicine

## 2013-05-31 NOTE — Telephone Encounter (Signed)
Depo testosterone called to pharmacy  

## 2013-07-19 ENCOUNTER — Encounter: Payer: BC Managed Care – PPO | Attending: Endocrinology | Admitting: *Deleted

## 2013-07-19 ENCOUNTER — Encounter: Payer: Self-pay | Admitting: *Deleted

## 2013-07-19 VITALS — Ht 74.0 in | Wt 268.5 lb

## 2013-07-19 DIAGNOSIS — E119 Type 2 diabetes mellitus without complications: Secondary | ICD-10-CM | POA: Insufficient documentation

## 2013-07-19 DIAGNOSIS — Z713 Dietary counseling and surveillance: Secondary | ICD-10-CM | POA: Insufficient documentation

## 2013-07-19 NOTE — Progress Notes (Signed)
Appt start time: 1200 end time:  1300.  Assessment:  Patient was seen on  07/19/13 for individual diabetes education. Lives with wife, both shop for food and she does the cooking. 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 Not checking BG due to cost of strips. Goes to gym 4-5 days a week, enjoys watching sports games.   Current HbA1c: 11.3 %  MEDICATIONS: see list. Diabetes medication includes Metformin and Levemir   DIETARY INTAKE:  Usual eating pattern includes 3 meals and 3 snacks per day.  Everyday foods include good variety of all food groups.  Avoided foods include sweets usually, regular soda, sweet tea.    24-hr recall:  B ( AM): 1/2 cup oatmeal, 1 egg with [place] and cheese on wheat bread, water  Snk ( AM): bananas, roll of whole wheat crackers, small salad, bag of cheese popcorn throughout the day  L ( PM): sandwich on whole wheat bread with [place] and cheese, water Snk ( PM): same as AM D ( PM): lean meat including chicken and fish,enjoys pasta, vegetables every night, water Snk ( PM): chips if hungry now, not any ice cream or cookies anymore Beverages: water  Usual physical activity: works out at gym 4-5 days a week  Estimated energy needs: 1800 calories 200 g carbohydrates 135 g protein 50 g fat  Progress Towards Goal(s):  In progress.   Nutritional Diagnosis:  NB-1.1 Food and nutrition-related knowledge deficit As related to diabetes control.  As evidenced by A1c of 11.3%.    Intervention:  Nutrition counseling provided.  Discussed diabetes disease process and treatment options.  Discussed physiology of diabetes and role of obesity on insulin resistance.  Encouraged moderate weight reduction to improve glucose levels.  Discussed role of medications and diet in glucose control  Provided education on macronutrients on glucose levels.  Provided education on carb counting, importance of regularly scheduled  meals/snacks, and meal planning. Per assessment, he is eating appropriately for BG control.  Discussed effects of physical activity on glucose levels and long-term glucose control.  Commended him on his current activity level!  Reviewed patient medications.  Discussed role of medication on blood glucose and possible side effects  Discussed blood glucose monitoring and interpretation.  Discussed recommended target ranges and individual ranges.    Described short-term complications: hyper- and hypo-glycemia.  Discussed causes,symptoms, and treatment options. Plan to discuss at next visit:  Discussed prevention, detection, and treatment of long-term complications.  Discussed the role of prolonged elevated glucose levels on body systems.  Discussed role of stress on blood glucose levels and discussed strategies to manage psychosocial issues.  Discussed recommendations for long-term diabetes self-care.  Established checklist for medical, dental, and emotional self-care.  Plan: Check with your insurance company to make sure they cover the Accu Chek strips and lancets  If so, fill your Rx for AccuChek Aviva or Nano meter and AccuChek FastClix Drum for the lancing device of choice Check your BG at least once a day, alternate between before and after meals as well as after exercise as tolerated  Handouts given during visit include: Living Well with Diabetes Carb Counting and Food Label handouts Meal Plan Card  Insulin action handout  Barriers to learning/adherance to lifestyle change: limited income and cost of strips  Diabetes self-care support plan:   [facility] support group  Find out from insurance best meter company to choose for best coverage  Monitoring/Evaluation:  Dietary intake,  exercise, SMBG, and body weight in 4 week(s).

## 2013-07-19 NOTE — Patient Instructions (Signed)
Plan: Check with your insurance company to make sure they cover the Accu Chek strips and lancets  If so, fill your Rx for AccuChek Aviva or Nano meter and AccuChek FastClix Drum for the lancing device of choice Check your BG at least once a day, alternate between before and after meals as well as after exercise as tolerated

## 2013-07-27 ENCOUNTER — Other Ambulatory Visit (INDEPENDENT_AMBULATORY_CARE_PROVIDER_SITE_OTHER): Payer: BC Managed Care – PPO

## 2013-07-27 DIAGNOSIS — E1059 Type 1 diabetes mellitus with other circulatory complications: Secondary | ICD-10-CM

## 2013-07-27 LAB — HEMOGLOBIN A1C: Hgb A1c MFr Bld: 11.1 % — ABNORMAL HIGH (ref 4.6–6.5)

## 2013-08-03 ENCOUNTER — Encounter: Payer: Self-pay | Admitting: Endocrinology

## 2013-08-03 ENCOUNTER — Ambulatory Visit (INDEPENDENT_AMBULATORY_CARE_PROVIDER_SITE_OTHER): Payer: BC Managed Care – PPO | Admitting: Endocrinology

## 2013-08-03 ENCOUNTER — Telehealth: Payer: Self-pay

## 2013-08-03 VITALS — BP 136/80 | HR 80 | Ht 76.0 in | Wt 271.0 lb

## 2013-08-03 DIAGNOSIS — E119 Type 2 diabetes mellitus without complications: Secondary | ICD-10-CM

## 2013-08-03 MED ORDER — FLUCONAZOLE 150 MG PO TABS: 150.0000 mg | ORAL_TABLET | Freq: Every day | ORAL | Status: DC

## 2013-08-03 NOTE — Progress Notes (Signed)
Subjective:    Patient ID: [name], male    DOB: [birth year], 57 y.o.   MRN: [id number]  HPI pt returns for f/u of insulin-requiring DM (dx'ed 2004; he has mild if any neuropathy of the lower extremities; no associated chronic complications).  He takes levemir qd as rx'ed.  He does not check cbg's.  pt states he feels well in general.  Pt states few days of erythema of the penis, and associated itching.   Past Medical History  Diagnosis Date  . ABNORMAL CHEST XRAY 2009  . DIABETES MELLITUS, TYPE II   . ERECTILE DYSFUNCTION, MILD   . HYPERTENSION   . Overweight(278.02)   . PSEUDOFOLLICULITIS BARBAE   . PULMONARY NODULE, RIGHT UPPER LOBE 02/10/2009  . Biceps tendon rupture   . Hypogonadism, male     Past Surgical History  Procedure Laterality Date  . Nasal polyp surgery      History   Social History  . Marital Status: Married    Spouse Name: N/A    Number of Children: 2  . Years of Education: N/A   Occupational History  . WORKS [employer]    Social History Main Topics  . Smoking status: Former Smoker    Quit date: 10/20/1991  . Smokeless tobacco: Never Used  . Alcohol Use: No  . Drug Use: No  . Sexual Activity: Yes    Partners: Female   Other Topics Concern  . 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 2 daughters- '80, '84- 2 grandchildren-'99,  '06      Regular exercise: walk 3-4 x a week; lifting light weights   Caffeine use: none    Current Outpatient Prescriptions on File Prior to Visit  Medication Sig Dispense Refill  . aspirin 81 MG tablet Take 81 mg by mouth daily.        . Blood Glucose Monitoring Suppl (ACCU-CHEK AVIVA) device Use as instructed  1 each  0  . Clindamycin-Benzoyl Per-Cleans (DUAC CS) external kit Apply topically 2 (two) times daily.        . enalapril (VASOTEC) 10 MG tablet Take 1 tablet (10 mg total) by mouth daily.  30  tablet  11  . furosemide (LASIX) 20 MG tablet Take 1 tablet (20 mg total) by mouth daily.  30 tablet  3  . glucose blood (ACCU-CHEK AVIVA) test strip 1 each by Other route 2 (two) times daily. And lancets 2/day  100 each  12  . Testosterone (ANDROGEL PUMP TD) Place 1 applicator onto the skin daily.      Marland Kitchen testosterone cypionate (DEPOTESTOTERONE CYPIONATE) 200 MG/ML injection INJECT INTRAMUSCULARLY EVERY 14 DAYS  10 mL  0   No current facility-administered medications on file prior to visit.    No Known Allergies  Family History  Problem Relation Age of Onset  . Hypertension Mother   . Diabetes Mother   . Cancer Brother     Pancreatic  . Kidney failure Father   . Coronary artery disease Neg Hx     BP 136/80  Pulse 80  Ht 6\' 4"  (1.93 m)  Wt 271 lb (122.925 kg)  BMI 33 kg/m2  SpO2 98%  Review of Systems denies hypoglycemia and weight change    Objective:   Physical Exam VITAL SIGNS:  See vs page GENERAL: no distress SKIN:  Insulin injection sites  at the anterior abdomen are normal.   Penis: moderate erythema     Assessment & Plan:  Balanitis, new DM: he needs increased rx Noncompliance with cbg monitoring.  This limits the rx of DM

## 2013-08-03 NOTE — Telephone Encounter (Signed)
Left message letter will be sent

## 2013-08-03 NOTE — Telephone Encounter (Signed)
Letter will be sent.

## 2013-08-03 NOTE — Telephone Encounter (Signed)
Pt would like to know the status of his paperwork he gave you today, please advise

## 2013-08-03 NOTE — Patient Instructions (Addendum)
check your blood sugar twice a day.  vary the time of day when you check, between before the 3 meals, and at bedtime.  also check if you have symptoms of your blood sugar being too high or too low.  please keep a record of the readings and bring it to your next appointment here.  please call us sooner if your blood sugar goes below 70, or if you have a lot of readings over 200. Please increase "levemir," to 30 units each morning.   Please come back for a follow-up appointment in 1 month.   i have sent a prescription to your pharmacy, for the yeast infection.

## 2013-08-13 ENCOUNTER — Telehealth: Payer: Self-pay | Admitting: Endocrinology

## 2013-08-13 NOTE — Telephone Encounter (Signed)
Pt wanted to know what the letter stated, read the letter to pt

## 2013-08-17 ENCOUNTER — Ambulatory Visit: Payer: BC Managed Care – PPO | Admitting: *Deleted

## 2013-09-07 ENCOUNTER — Ambulatory Visit: Payer: BC Managed Care – PPO | Admitting: Endocrinology

## 2013-09-21 ENCOUNTER — Ambulatory Visit: Payer: BC Managed Care – PPO | Admitting: *Deleted

## 2013-10-01 ENCOUNTER — Other Ambulatory Visit: Payer: Self-pay | Admitting: *Deleted

## 2013-10-01 ENCOUNTER — Telehealth: Payer: Self-pay | Admitting: Endocrinology

## 2013-10-01 MED ORDER — INSULIN PEN NEEDLE 31G X 6 MM MISC: Status: DC

## 2013-10-01 NOTE — Telephone Encounter (Signed)
Needs flex pens needles, please call [phone] / Sherri S.

## 2013-12-06 ENCOUNTER — Other Ambulatory Visit: Payer: Self-pay | Admitting: *Deleted

## 2013-12-06 ENCOUNTER — Other Ambulatory Visit (INDEPENDENT_AMBULATORY_CARE_PROVIDER_SITE_OTHER): Payer: BC Managed Care – PPO

## 2013-12-06 DIAGNOSIS — E119 Type 2 diabetes mellitus without complications: Secondary | ICD-10-CM

## 2013-12-06 LAB — HEMOGLOBIN A1C: Hgb A1c MFr Bld: 12.6 % — ABNORMAL HIGH (ref 4.6–6.5)

## 2013-12-11 ENCOUNTER — Telehealth: Payer: Self-pay

## 2013-12-11 DIAGNOSIS — Z0000 Encounter for general adult medical examination without abnormal findings: Secondary | ICD-10-CM

## 2013-12-11 NOTE — Telephone Encounter (Signed)
Labs entered.

## 2013-12-28 ENCOUNTER — Ambulatory Visit: Payer: BC Managed Care – PPO | Admitting: Endocrinology

## 2014-01-04 ENCOUNTER — Encounter: Payer: Self-pay | Admitting: Endocrinology

## 2014-01-04 ENCOUNTER — Ambulatory Visit (INDEPENDENT_AMBULATORY_CARE_PROVIDER_SITE_OTHER): Payer: BC Managed Care – PPO | Admitting: Endocrinology

## 2014-01-04 VITALS — BP 122/60 | HR 80 | Temp 98.5°F | Ht 76.0 in | Wt 261.0 lb

## 2014-01-04 DIAGNOSIS — E119 Type 2 diabetes mellitus without complications: Secondary | ICD-10-CM

## 2014-01-04 MED ORDER — INSULIN DETEMIR 100 UNIT/ML FLEXPEN: 50.0000 [IU] | PEN_INJECTOR | SUBCUTANEOUS | Status: DC

## 2014-01-04 NOTE — Progress Notes (Signed)
Subjective:    Patient ID: [name], male    DOB: 11-28-1956, 58 y.o.   MRN: [id number]  HPI pt returns for f/u of insulin-requiring DM (dx'ed 2004; he has mild if any neuropathy of the lower extremities; no associated chronic complications; he started taking insulin in 2014, and he agrees to take only QD).  He says never misses the insulin.  no cbg record, but states cbg's are usually in the 200's.  pt states he feels well in general. Past Medical History  Diagnosis Date  . ABNORMAL CHEST XRAY 2009  . DIABETES MELLITUS, TYPE II   . ERECTILE DYSFUNCTION, MILD   . HYPERTENSION   . Overweight   . PSEUDOFOLLICULITIS BARBAE   . PULMONARY NODULE, RIGHT UPPER LOBE 02/10/2009  . Biceps tendon rupture   . Hypogonadism, male     Past Surgical History  Procedure Laterality Date  . Nasal polyp surgery      History   Social History  . Marital Status: Married    Spouse Name: N/A    Number of Children: 2  . Years of Education: N/A   Occupational History  . WORKS [employer]    Social History Main Topics  . Smoking status: Former Smoker    Quit date: 10/20/1991  . Smokeless tobacco: Never Used  . Alcohol Use: No  . Drug Use: No  . Sexual Activity: Yes    Partners: Female   Other Topics Concern  . 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 2 daughters- '80, '84- 2 grandchildren-'99,  '06      Regular exercise: walk 3-4 x a week; lifting light weights   Caffeine use: none    Current Outpatient Prescriptions on File Prior to Visit  Medication Sig Dispense Refill  . aspirin 81 MG tablet Take 81 mg by mouth daily.        . Blood Glucose Monitoring Suppl (ACCU-CHEK AVIVA) device Use as instructed  1 each  0  . Clindamycin-Benzoyl Per-Cleans (DUAC CS) external kit Apply topically 2 (two) times daily.        . enalapril (VASOTEC) 10 MG tablet Take 1 tablet (10 mg  total) by mouth daily.  30 tablet  11  . fluconazole (DIFLUCAN) 150 MG tablet Take 1 tablet (150 mg total) by mouth daily.  3 tablet  1  . furosemide (LASIX) 20 MG tablet Take 1 tablet (20 mg total) by mouth daily.  30 tablet  3  . glucose blood (ACCU-CHEK AVIVA) test strip 1 each by Other route 2 (two) times daily. And lancets 2/day  100 each  12  . Insulin Pen Needle 31G X 6 MM MISC Use 1 time daily as directed.  50 each  1  . Testosterone (ANDROGEL PUMP TD) Place 1 applicator onto the skin daily.      Marland Kitchen testosterone cypionate (DEPOTESTOTERONE CYPIONATE) 200 MG/ML injection INJECT 2MLS INTRAMUSCULARLY EVERY 14 DAYS  10 mL  0   No current facility-administered medications on file prior to visit.   No Known Allergies  Family History  Problem Relation Age of Onset  . Hypertension Mother   . Diabetes Mother   . Cancer Brother     Pancreatic  . Kidney failure Father   . Coronary artery disease Neg Hx     BP 122/60  Pulse 80  Temp(Src) 98.5 F (  36.9 C) (Oral)  Ht '6\' 4"'  (1.93 m)  Wt 261 lb (118.389 kg)  BMI 31.78 kg/m2  SpO2 98%  Review of Systems denies hypoglycemia and weight change.    Objective:   Physical Exam VITAL SIGNS:  See vs page GENERAL: no distress   Lab Results  Component Value Date   HGBA1C 12.6* 12/06/2013      Assessment & Plan:  DM: he needs increased rx. Noncompliance with cbg monitoring.  This limits the rx of DM.

## 2014-01-04 NOTE — Patient Instructions (Addendum)
check your blood sugar twice a day.  vary the time of day when you check, between before the 3 meals, and at bedtime.  also check if you have symptoms of your blood sugar being too high or too low.  please keep a record of the readings and bring it to your next appointment here.  please call us sooner if your blood sugar goes below 70, or if you have a lot of readings over 200.   Please increase "levemir," to 50 units each morning.   Please come back for a follow-up appointment in 3 months.

## 2014-02-18 ENCOUNTER — Encounter: Payer: BC Managed Care – PPO | Admitting: Internal Medicine

## 2014-02-28 ENCOUNTER — Other Ambulatory Visit (INDEPENDENT_AMBULATORY_CARE_PROVIDER_SITE_OTHER): Payer: BC Managed Care – PPO

## 2014-02-28 DIAGNOSIS — Z0000 Encounter for general adult medical examination without abnormal findings: Secondary | ICD-10-CM

## 2014-02-28 LAB — LIPID PANEL
Cholesterol: 135 mg/dL (ref 0–200)
HDL: 35.8 mg/dL — ABNORMAL LOW (ref >39.00)
LDL Cholesterol: 84 mg/dL (ref 0–99)
Total CHOL/HDL Ratio: 4
Triglycerides: 74 mg/dL (ref 0.0–149.0)
VLDL: 14.8 mg/dL (ref 0.0–40.0)

## 2014-02-28 LAB — BASIC METABOLIC PANEL
BUN: 10 mg/dL (ref 6–23)
CO2: 26 mEq/L (ref 19–32)
Calcium: 9.4 mg/dL (ref 8.4–10.5)
Chloride: 99 mEq/L (ref 96–112)
Creatinine, Ser: 0.8 mg/dL (ref 0.4–1.5)
GFR: 122.84 mL/min (ref >60.00)
Glucose, Bld: 229 mg/dL — ABNORMAL HIGH (ref 70–99)
Potassium: 4.3 mEq/L (ref 3.5–5.1)
Sodium: 134 mEq/L — ABNORMAL LOW (ref 135–145)

## 2014-02-28 LAB — HEPATIC FUNCTION PANEL
ALT: 41 U/L (ref 0–53)
AST: 34 U/L (ref 0–37)
Albumin: 4.2 g/dL (ref 3.5–5.2)
Alkaline Phosphatase: 51 U/L (ref 39–117)
Bilirubin, Direct: 0.1 mg/dL (ref 0.0–0.3)
Total Bilirubin: 0.7 mg/dL (ref 0.3–1.2)
Total Protein: 7.4 g/dL (ref 6.0–8.3)

## 2014-02-28 LAB — URINALYSIS, ROUTINE W REFLEX MICROSCOPIC
Bilirubin Urine: NEGATIVE
Hgb urine dipstick: NEGATIVE
Ketones, ur: NEGATIVE
Leukocytes, UA: NEGATIVE
Nitrite: NEGATIVE
Specific Gravity, Urine: 1.015 (ref 1.000–1.030)
Total Protein, Urine: NEGATIVE
Urine Glucose: >=1000 — AB
Urobilinogen, UA: 0.2 (ref 0.0–1.0)
pH: 6 (ref 5.0–8.0)

## 2014-02-28 LAB — PSA: PSA: 0.8 ng/mL (ref 0.10–4.00)

## 2014-02-28 LAB — CBC WITH DIFFERENTIAL/PLATELET
Basophils Absolute: 0 10*3/uL (ref 0.0–0.1)
Basophils Relative: 0.4 % (ref 0.0–3.0)
Eosinophils Absolute: 0.2 10*3/uL (ref 0.0–0.7)
Eosinophils Relative: 2.3 % (ref 0.0–5.0)
HCT: 43.9 % (ref 39.0–52.0)
Hemoglobin: 13.8 g/dL (ref 13.0–17.0)
Lymphocytes Relative: 33.5 % (ref 12.0–46.0)
Lymphs Abs: 2.9 10*3/uL (ref 0.7–4.0)
MCHC: 31.4 g/dL (ref 30.0–36.0)
MCV: 73.4 fl — ABNORMAL LOW (ref 78.0–100.0)
Monocytes Absolute: 0.6 10*3/uL (ref 0.1–1.0)
Monocytes Relative: 6.8 % (ref 3.0–12.0)
Neutro Abs: 4.9 10*3/uL (ref 1.4–7.7)
Neutrophils Relative %: 57 % (ref 43.0–77.0)
Platelets: 143 10*3/uL — ABNORMAL LOW (ref 150.0–400.0)
RBC: 5.98 Mil/uL — ABNORMAL HIGH (ref 4.22–5.81)
RDW: 17.2 % — ABNORMAL HIGH (ref 11.5–14.6)
WBC: 8.7 10*3/uL (ref 4.5–10.5)

## 2014-02-28 LAB — TSH: TSH: 1.56 u[IU]/mL (ref 0.35–5.50)

## 2014-03-01 ENCOUNTER — Encounter: Payer: BC Managed Care – PPO | Admitting: Internal Medicine

## 2014-03-06 ENCOUNTER — Other Ambulatory Visit: Payer: BC Managed Care – PPO

## 2014-03-07 ENCOUNTER — Other Ambulatory Visit (INDEPENDENT_AMBULATORY_CARE_PROVIDER_SITE_OTHER): Payer: BC Managed Care – PPO

## 2014-03-07 ENCOUNTER — Other Ambulatory Visit: Payer: Self-pay

## 2014-03-07 DIAGNOSIS — E119 Type 2 diabetes mellitus without complications: Secondary | ICD-10-CM

## 2014-03-07 LAB — HEMOGLOBIN A1C: Hgb A1c MFr Bld: 12.1 % — ABNORMAL HIGH (ref 4.6–6.5)

## 2014-03-08 ENCOUNTER — Encounter: Payer: Self-pay | Admitting: Internal Medicine

## 2014-03-08 ENCOUNTER — Telehealth: Payer: Self-pay | Admitting: Endocrinology

## 2014-03-08 ENCOUNTER — Ambulatory Visit (INDEPENDENT_AMBULATORY_CARE_PROVIDER_SITE_OTHER): Payer: BC Managed Care – PPO | Admitting: Internal Medicine

## 2014-03-08 VITALS — BP 132/82 | HR 106 | Temp 98.5°F | Ht 74.0 in | Wt 266.2 lb

## 2014-03-08 DIAGNOSIS — Z0000 Encounter for general adult medical examination without abnormal findings: Secondary | ICD-10-CM

## 2014-03-08 DIAGNOSIS — E291 Testicular hypofunction: Secondary | ICD-10-CM | POA: Insufficient documentation

## 2014-03-08 DIAGNOSIS — E119 Type 2 diabetes mellitus without complications: Secondary | ICD-10-CM

## 2014-03-08 HISTORY — DX: Testicular hypofunction: E29.1

## 2014-03-08 MED ORDER — INSULIN DETEMIR 100 UNIT/ML FLEXPEN: 55.0000 [IU] | PEN_INJECTOR | SUBCUTANEOUS | Status: DC

## 2014-03-08 NOTE — Assessment & Plan Note (Signed)
Severe uncontrolled, to incr the levemir too 55 for now, consider change to toujeo as may be more effective, less risk of low sugar, and cost less with discount card Lab Results  Component Value Date   HGBA1C 12.1* 03/07/2014

## 2014-03-08 NOTE — Progress Notes (Signed)
Pre visit review using our clinic review tool, if applicable. No additional management support is needed unless otherwise documented below in the visit note. 

## 2014-03-08 NOTE — Patient Instructions (Signed)
OK to increase your levemir to 55 units per day  Please keep your appointments with your specialists as you have planned  - Dr Loanne Drilling in May 2015  You may want to mention the new insulin called Toujeo, which may work better, less chance of low sugars, and may cost less with a discount card  Please continue all other medications as before, and refills have been done if requested. Please have the pharmacy call with any other refills you may need.  Please continue your efforts at being more active, low cholesterol diet, and weight control. You are otherwise up to date with prevention measures today.  Your EKG and labs were otherwise OK today  Please remember to sign up for MyChart if you have not done so, as this will be important to you in the future with finding out test results, communicating by private email, and scheduling acute appointments online when needed.  Please return in 6 months, or sooner if needed

## 2014-03-08 NOTE — Progress Notes (Signed)
Subjective:    Patient ID: [name], male    DOB: August 10, 1956, 58 y.o.   MRN: [id number]  HPI  Here for wellness and f/u;  Overall doing ok;  Pt denies CP, worsening SOB, DOE, wheezing, orthopnea, PND, worsening LE edema, palpitations, dizziness or syncope.  Pt denies neurological change such as new headache, facial or extremity weakness.  Pt denies polydipsia, polyuria, or low sugar symptoms. Pt states overall good compliance with treatment and medications, good tolerability, and has been trying to follow lower cholesterol diet.  Pt denies worsening depressive symptoms, suicidal ideation or panic. No fever, night sweats, wt loss, loss of appetite, or other constitutional symptoms.  Pt states good ability with ADL's, has low fall risk, home safety reviewed and adequate, no other significant changes in hearing or vision, and only occasionally active with exercise.   Pt denies fever, wt loss, night sweats, loss of appetite, or other constitutional symptoms No acute complaints.  Has appt with endo may 2015.  Overall has lost wt from 300 to current gradually over 4-5 yrs. Past Medical History  Diagnosis Date  . ABNORMAL CHEST XRAY 2009  . DIABETES MELLITUS, TYPE II   . ERECTILE DYSFUNCTION, MILD   . HYPERTENSION   . Overweight   . PSEUDOFOLLICULITIS BARBAE   . PULMONARY NODULE, RIGHT UPPER LOBE 02/10/2009  . Biceps tendon rupture   . Hypogonadism, male   . Hypogonadism male 03/08/2014   Past Surgical History  Procedure Laterality Date  . Nasal polyp surgery      reports that he quit smoking about 22 years ago. He has never used smokeless tobacco. He reports that he does not drink alcohol or use illicit drugs. family history includes Cancer in his brother; Diabetes in his mother; Hypertension in his mother; Kidney failure in his father. There is no history of Coronary artery disease. No Known Allergies Current Outpatient Prescriptions on File Prior to Visit  Medication Sig Dispense Refill    . aspirin 81 MG tablet Take 81 mg by mouth daily.        . Blood Glucose Monitoring Suppl (ACCU-CHEK AVIVA) device Use as instructed  1 each  0  . glucose blood (ACCU-CHEK AVIVA) test strip 1 each by Other route 2 (two) times daily. And lancets 2/day  100 each  12  . Insulin Pen Needle 31G X 6 MM MISC Use 1 time daily as directed.  50 each  1  . testosterone cypionate (DEPOTESTOTERONE CYPIONATE) 200 MG/ML injection INJECT 2MLS INTRAMUSCULARLY EVERY 14 DAYS  10 mL  0   No current facility-administered medications on file prior to visit.   Review of Systems Constitutional: Negative for diaphoresis, activity change, appetite change or unexpected weight change.  HENT: Negative for hearing loss, ear pain, facial swelling, mouth sores and neck stiffness.   Eyes: Negative for pain, redness and visual disturbance.  Respiratory: Negative for shortness of breath and wheezing.   Cardiovascular: Negative for chest pain and palpitations.  Gastrointestinal: Negative for diarrhea, blood in stool, abdominal distention or other pain Genitourinary: Negative for hematuria, flank pain or change in urine volume.  Musculoskeletal: Negative for myalgias and joint swelling.  Skin: Negative for color change and wound.  Neurological: Negative for syncope and numbness. other than noted Hematological: Negative for adenopathy.  Psychiatric/Behavioral: Negative for hallucinations, self-injury, decreased concentration and agitation.      Objective:   Physical Exam BP 132/82  Pulse 106  Temp(Src) 98.5 F (36.9 C) (Oral)  Ht 6'  2" (1.88 m)  Wt 266 lb 4 oz (120.77 kg)  BMI 34.17 kg/m2  SpO2 95% VS noted,  Constitutional: Pt is oriented to person, place, and time. Appears well-developed and well-nourished.  Head: Normocephalic and atraumatic.  Right Ear: External ear normal.  Left Ear: External ear normal.  Nose: Nose normal.  Mouth/Throat: Oropharynx is clear and moist.  Eyes: Conjunctivae and EOM are  normal. Pupils are equal, round, and reactive to light.  Neck: Normal range of motion. Neck supple. No JVD present. No tracheal deviation present.  Cardiovascular: Normal rate, regular rhythm, normal heart sounds and intact distal pulses.   Pulmonary/Chest: Effort normal and breath sounds normal.  Abdominal: Soft. Bowel sounds are normal. There is no tenderness. No HSM  Musculoskeletal: Normal range of motion. Exhibits no edema.  Lymphadenopathy:  Has no cervical adenopathy.  Neurological: Pt is alert and oriented to person, place, and time. Pt has normal reflexes. No cranial nerve deficit.  Skin: Skin is warm and dry. No rash noted.  Psychiatric:  Has  normal mood and affect. Behavior is normal.      Assessment & Plan:

## 2014-03-08 NOTE — Telephone Encounter (Signed)
Pt would like to speak with nurse regarding his insulin issues   Please advise patient  Thank You :)

## 2014-03-08 NOTE — Assessment & Plan Note (Addendum)

## 2014-03-12 NOTE — Telephone Encounter (Signed)
Requested call back.  

## 2014-03-12 NOTE — Telephone Encounter (Signed)
Pt called back and stated that blood sugars have been over 200 for the past month. Pt is still currently taking 55 units of Levemir each morning.  Please advise, Thanks!

## 2014-03-12 NOTE — Telephone Encounter (Signed)
Requested call back to discuss.  

## 2014-03-12 NOTE — Telephone Encounter (Signed)
Please increase to 75 units qam Ret as scheduled 

## 2014-03-12 NOTE — Telephone Encounter (Signed)
This insulin is the same as lantus, which is not right for you. Is your blood sugar still over 200? Please let me know.

## 2014-03-12 NOTE — Telephone Encounter (Signed)
Pt called requesting that Dr. Loanne Drilling review labs from visit with Dr. Jenny Reichmann. Pt is also requesting a trial run of Toujeo. Please advise, Thanks!

## 2014-03-13 NOTE — Telephone Encounter (Signed)
Noted left voice mail informing.

## 2014-03-22 ENCOUNTER — Telehealth: Payer: Self-pay

## 2014-03-22 NOTE — Telephone Encounter (Signed)
Relevant patient education mailed to patient.  

## 2014-04-04 ENCOUNTER — Ambulatory Visit: Payer: BC Managed Care – PPO | Admitting: Endocrinology

## 2014-05-02 LAB — HM DIABETES EYE EXAM

## 2014-05-06 LAB — HM DIABETES EYE EXAM

## 2014-05-10 ENCOUNTER — Encounter: Payer: Self-pay | Admitting: Internal Medicine

## 2014-06-07 ENCOUNTER — Other Ambulatory Visit (INDEPENDENT_AMBULATORY_CARE_PROVIDER_SITE_OTHER): Payer: BC Managed Care – PPO

## 2014-06-07 ENCOUNTER — Other Ambulatory Visit: Payer: Self-pay

## 2014-06-07 DIAGNOSIS — E119 Type 2 diabetes mellitus without complications: Secondary | ICD-10-CM

## 2014-06-07 LAB — HEMOGLOBIN A1C: Hgb A1c MFr Bld: 10.8 % — ABNORMAL HIGH (ref 4.6–6.5)

## 2014-06-14 ENCOUNTER — Ambulatory Visit (INDEPENDENT_AMBULATORY_CARE_PROVIDER_SITE_OTHER): Payer: BC Managed Care – PPO | Admitting: Endocrinology

## 2014-06-14 ENCOUNTER — Encounter: Payer: Self-pay | Admitting: Endocrinology

## 2014-06-14 VITALS — BP 128/82 | HR 73 | Temp 98.7°F | Ht 74.0 in | Wt 267.0 lb

## 2014-06-14 DIAGNOSIS — E119 Type 2 diabetes mellitus without complications: Secondary | ICD-10-CM

## 2014-06-14 MED ORDER — GLUCOSE BLOOD VI STRP: 1.0000 | ORAL_STRIP | Freq: Two times a day (BID) | Status: DC

## 2014-06-14 MED ORDER — INSULIN DETEMIR 100 UNIT/ML FLEXPEN: 100.0000 [IU] | PEN_INJECTOR | SUBCUTANEOUS | Status: DC

## 2014-06-14 MED ORDER — ACCU-CHEK AVIVA PLUS W/DEVICE KIT: 1.0000 | PACK | Freq: Once | Status: DC

## 2014-06-14 NOTE — Progress Notes (Signed)
Subjective:    Patient ID: [name], male    DOB: Mar 23, 1956, 58 y.o.   MRN: [id number]  HPI pt returns for f/u of insulin-requiring DM (dx'ed 2004; he has mild if any neuropathy of the lower extremities; no associated chronic complications; he started taking insulin in 2014, and he agrees to take only QD; he has never had pancreatitis, severe hypoglycemia, or DKA).  he takes he takes levemir 76 units qam, and he never misses it.  He does not check cbg's.    Past Medical History  Diagnosis Date  . ABNORMAL CHEST XRAY 2009  . DIABETES MELLITUS, TYPE II   . ERECTILE DYSFUNCTION, MILD   . HYPERTENSION   . Overweight(278.02)   . PSEUDOFOLLICULITIS BARBAE   . PULMONARY NODULE, RIGHT UPPER LOBE 02/10/2009  . Biceps tendon rupture   . Hypogonadism, male   . Hypogonadism male 03/08/2014    Past Surgical History  Procedure Laterality Date  . Nasal polyp surgery      History   Social History  . Marital Status: Married    Spouse Name: N/A    Number of Children: 2  . Years of Education: N/A   Occupational History  . WORKS [employer]    Social History Main Topics  . Smoking status: Former Smoker    Quit date: 10/20/1991  . Smokeless tobacco: Never Used  . Alcohol Use: No  . Drug Use: No  . Sexual Activity: Yes    Partners: Female   Other Topics Concern  . 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 2 daughters- '80, '84- 2 grandchildren-'99,  '06      Regular exercise: walk 3-4 x a week; lifting light weights   Caffeine use: none    Current Outpatient Prescriptions on File Prior to Visit  Medication Sig Dispense Refill  . aspirin 81 MG tablet Take 81 mg by mouth daily.        . Insulin Pen Needle 31G X 6 MM MISC Use 1 time daily as directed.  50 each  1  . testosterone cypionate (DEPOTESTOTERONE CYPIONATE) 200 MG/ML injection INJECT 2MLS INTRAMUSCULARLY EVERY  14 DAYS  10 mL  0   No current facility-administered medications on file prior to visit.    No Known Allergies  Family History  Problem Relation Age of Onset  . Hypertension Mother   . Diabetes Mother   . Cancer Brother     Pancreatic  . Kidney failure Father   . Coronary artery disease Neg Hx     BP 128/82  Pulse 73  Temp(Src) 98.7 F (37.1 C) (Oral)  Ht 6\' 2"  (1.88 m)  Wt 267 lb (121.11 kg)  BMI 34.27 kg/m2  SpO2 97%  Review of Systems He denies hypoglycemia and weight change.     Objective:   Physical Exam VITAL SIGNS:  See vs page GENERAL: no distress Pulses: dorsalis pedis intact bilat.   Feet: no deformity. normal color and temp.  no edema Skin:  no ulcer on the feet.   Neuro: sensation is intact to touch on the feet.     Lab Results  Component Value Date   HGBA1C 10.8* 06/07/2014      Assessment & Plan:  DM: severe exacerbation Noncompliance with cbg checking: I'll work around this as best I can.  Patient is advised the following: Patient  Instructions  check your blood sugar twice a day.  vary the time of day when you check, between before the 3 meals, and at bedtime.  also check if you have symptoms of your blood sugar being too high or too low.  please keep a record of the readings and bring it to your next appointment here.  please call us sooner if your blood sugar goes below 70, or if you have a lot of readings over 200.  Please increase levemir to 100 units each morning.  Please come back for a follow-up appointment in 1 month.

## 2014-06-14 NOTE — Patient Instructions (Addendum)
check your blood sugar twice a day.  vary the time of day when you check, between before the 3 meals, and at bedtime.  also check if you have symptoms of your blood sugar being too high or too low.  please keep a record of the readings and bring it to your next appointment here.  please call us sooner if your blood sugar goes below 70, or if you have a lot of readings over 200.  Please increase levemir to 100 units each morning.  Please come back for a follow-up appointment in 1 month.

## 2014-07-22 ENCOUNTER — Other Ambulatory Visit: Payer: Self-pay | Admitting: Endocrinology

## 2014-07-22 DIAGNOSIS — E119 Type 2 diabetes mellitus without complications: Secondary | ICD-10-CM

## 2014-07-23 ENCOUNTER — Ambulatory Visit: Payer: BC Managed Care – PPO | Admitting: Endocrinology

## 2014-08-01 ENCOUNTER — Ambulatory Visit: Payer: BC Managed Care – PPO | Admitting: Endocrinology

## 2014-09-04 ENCOUNTER — Ambulatory Visit: Payer: BC Managed Care – PPO | Admitting: Internal Medicine

## 2014-09-06 ENCOUNTER — Ambulatory Visit (INDEPENDENT_AMBULATORY_CARE_PROVIDER_SITE_OTHER): Payer: BC Managed Care – PPO | Admitting: Internal Medicine

## 2014-09-06 ENCOUNTER — Other Ambulatory Visit (INDEPENDENT_AMBULATORY_CARE_PROVIDER_SITE_OTHER): Payer: BC Managed Care – PPO

## 2014-09-06 ENCOUNTER — Encounter: Payer: Self-pay | Admitting: Internal Medicine

## 2014-09-06 VITALS — BP 142/78 | HR 101 | Temp 98.4°F | Ht 74.0 in | Wt 270.1 lb

## 2014-09-06 DIAGNOSIS — R319 Hematuria, unspecified: Secondary | ICD-10-CM

## 2014-09-06 DIAGNOSIS — E119 Type 2 diabetes mellitus without complications: Secondary | ICD-10-CM

## 2014-09-06 DIAGNOSIS — I10 Essential (primary) hypertension: Secondary | ICD-10-CM

## 2014-09-06 LAB — BASIC METABOLIC PANEL
BUN: 15 mg/dL (ref 6–23)
CO2: 28 mEq/L (ref 19–32)
Calcium: 9.7 mg/dL (ref 8.4–10.5)
Chloride: 101 mEq/L (ref 96–112)
Creatinine, Ser: 1 mg/dL (ref 0.4–1.5)
GFR: 101.22 mL/min (ref >60.00)
Glucose, Bld: 262 mg/dL — ABNORMAL HIGH (ref 70–99)
Potassium: 4.4 mEq/L (ref 3.5–5.1)
Sodium: 135 mEq/L (ref 135–145)

## 2014-09-06 LAB — URINALYSIS, ROUTINE W REFLEX MICROSCOPIC
Bilirubin Urine: NEGATIVE
Hgb urine dipstick: NEGATIVE
Ketones, ur: NEGATIVE
Leukocytes, UA: NEGATIVE
Nitrite: NEGATIVE
Specific Gravity, Urine: 1.02 (ref 1.000–1.030)
Total Protein, Urine: NEGATIVE
Urine Glucose: >=1000 — AB
Urobilinogen, UA: 0.2 (ref 0.0–1.0)
pH: 5.5 (ref 5.0–8.0)

## 2014-09-06 LAB — LIPID PANEL
Cholesterol: 138 mg/dL (ref 0–200)
HDL: 32 mg/dL — ABNORMAL LOW (ref >39.00)
LDL Cholesterol: 84 mg/dL (ref 0–99)
NonHDL: 106
Total CHOL/HDL Ratio: 4
Triglycerides: 108 mg/dL (ref 0.0–149.0)
VLDL: 21.6 mg/dL (ref 0.0–40.0)

## 2014-09-06 LAB — HEPATIC FUNCTION PANEL
ALT: 39 U/L (ref 0–53)
AST: 32 U/L (ref 0–37)
Albumin: 3.9 g/dL (ref 3.5–5.2)
Alkaline Phosphatase: 45 U/L (ref 39–117)
Bilirubin, Direct: 0.1 mg/dL (ref 0.0–0.3)
Total Bilirubin: 0.9 mg/dL (ref 0.2–1.2)
Total Protein: 8.3 g/dL (ref 6.0–8.3)

## 2014-09-06 LAB — HEMOGLOBIN A1C: Hgb A1c MFr Bld: 10.3 % — ABNORMAL HIGH (ref 4.6–6.5)

## 2014-09-06 NOTE — Progress Notes (Signed)
Pre visit review using our clinic review tool, if applicable. No additional management support is needed unless otherwise documented below in the visit note. 

## 2014-09-06 NOTE — Patient Instructions (Addendum)
Please continue all other medications as before, and refills have been done if requested.  Please have the pharmacy call with any other refills you may need.  Please continue your efforts at being more active, low cholesterol diet, and weight control.  You are otherwise up to date with prevention measures today.  Please keep your appointments with your specialists as you may have planned - endocrinology at [facility]  Please go to the LAB in the Basement (turn left off the elevator) for the tests to be done today  You will be contacted by phone if any changes need to be made immediately.  Otherwise, you will receive a letter about your results with an explanation, but please check with MyChart first.  Please return in 6 months, or sooner if needed

## 2014-09-06 NOTE — Progress Notes (Signed)
Subjective:    Patient ID: [name], male    DOB: 08-07-1956, 58 y.o.   MRN: [id number]  HPI  Here to f/u, had a spot of blood to glans penis meatus one time to underclothes last wk, no recurrence.  Denies urinary symptoms such as dysuria, frequency, urgency, flank pain, or n/v, fever, chills.  Usually sees Dr Loanne Drilling for sugar, was rx levemir 100 units but between himself and dietician felt it would make it more difficult on losing wt.  Fairlly expensive, not wanting to keep up with Dr Loanne Drilling further for now.  Wants to go back to metformin if possible.  Really working more on diet recently and more excercise, recalls an a1c 7.8 3 yrs ago.  Has self referred now to endo at [facility].  Past Medical History  Diagnosis Date  . ABNORMAL CHEST XRAY 2009  . DIABETES MELLITUS, TYPE II   . ERECTILE DYSFUNCTION, MILD   . HYPERTENSION   . Overweight(278.02)   . PSEUDOFOLLICULITIS BARBAE   . PULMONARY NODULE, RIGHT UPPER LOBE 02/10/2009  . Biceps tendon rupture   . Hypogonadism, male   . Hypogonadism male 03/08/2014   Past Surgical History  Procedure Laterality Date  . Nasal polyp surgery      reports that he quit smoking about 22 years ago. He has never used smokeless tobacco. He reports that he does not drink alcohol or use illicit drugs. family history includes Cancer in his brother; Diabetes in his mother; Hypertension in his mother; Kidney failure in his father. There is no history of Coronary artery disease. No Known Allergies Current Outpatient Prescriptions on File Prior to Visit  Medication Sig Dispense Refill  . aspirin 81 MG tablet Take 81 mg by mouth daily.        . Blood Glucose Monitoring Suppl (ACCU-CHEK AVIVA PLUS) W/DEVICE KIT 1 Device by Does not apply route once.  1 kit  0  . glucose blood (ACCU-CHEK AVIVA) test strip 1 each by Other route 2 (two) times daily. And lancets 2/day  100 each  12  . Insulin Detemir (LEVEMIR) 100 UNIT/ML Pen Inject 100 Units into the skin  every morning.  45 mL  11  . Insulin Pen Needle 31G X 6 MM MISC Use 1 time daily as directed.  50 each  1  . testosterone cypionate (DEPOTESTOTERONE CYPIONATE) 200 MG/ML injection INJECT 2MLS INTRAMUSCULARLY EVERY 14 DAYS  10 mL  0   No current facility-administered medications on file prior to visit.    Review of Systems  Constitutional: Negative for unusual diaphoresis or other sweats  HENT: Negative for ringing in ear Eyes: Negative for double vision or worsening visual disturbance.  Respiratory: Negative for choking and stridor.   Gastrointestinal: Negative for vomiting or other signifcant bowel change Genitourinary: Negative for hematuria or decreased urine volume.  Musculoskeletal: Negative for other MSK pain or swelling Skin: Negative for color change and worsening wound.  Neurological: Negative for tremors and numbness other than noted  Psychiatric/Behavioral: Negative for decreased concentration or agitation other than above       Objective:   Physical Exam BP 142/78  Pulse 101  Temp(Src) 98.4 F (36.9 C) (Oral)  Ht '6\' 2"'  (1.88 m)  Wt 270 lb 2 oz (122.528 kg)  BMI 34.67 kg/m2  SpO2 97% VS noted,  Constitutional: Pt appears well-developed, well-nourished.  HENT: Head: NCAT.  Right Ear: External ear normal.  Left Ear: External ear normal.  Eyes: . Pupils are equal, round,  and reactive to light. Conjunctivae and EOM are normal Neck: Normal range of motion. Neck supple.  Cardiovascular: Normal rate and regular rhythm.   Pulmonary/Chest: Effort normal and breath sounds normal.  Abd:  Soft, NT, ND, + BS GU: normal ext male genitals, no overt bleeding Neurological: Pt is alert. Not confused , motor grossly intact Skin: Skin is warm. No rash Psychiatric: Pt behavior is normal. No agitation.     Assessment & Plan:

## 2014-09-06 NOTE — Assessment & Plan Note (Signed)
One spot on underclothes after riding stat bike, none prior or since, for UA, suspect some prostate irritation, declines urology for now

## 2014-09-08 NOTE — Assessment & Plan Note (Signed)
stable overall by history and exam, recent data reviewed with pt, and pt to continue medical treatment as before,  to f/u any worsening symptoms or concerns, for fu lab as does not have specific f/u at this time, pt referring himself to new endo

## 2014-09-08 NOTE — Assessment & Plan Note (Signed)
stable overall by history and exam, recent data reviewed with pt, and pt to continue medical treatment as before,  to f/u any worsening symptoms or concerns BP Readings from Last 3 Encounters:  09/06/14 142/78  06/14/14 128/82  03/08/14 132/82

## 2014-09-09 ENCOUNTER — Telehealth: Payer: Self-pay | Admitting: Internal Medicine

## 2014-09-09 NOTE — Telephone Encounter (Signed)
emmi mailed  °

## 2014-09-11 ENCOUNTER — Other Ambulatory Visit: Payer: Self-pay | Admitting: Internal Medicine

## 2014-09-11 DIAGNOSIS — E119 Type 2 diabetes mellitus without complications: Secondary | ICD-10-CM

## 2014-09-12 ENCOUNTER — Telehealth: Payer: Self-pay | Admitting: Internal Medicine

## 2014-09-12 DIAGNOSIS — E119 Type 2 diabetes mellitus without complications: Secondary | ICD-10-CM

## 2014-09-12 NOTE — Telephone Encounter (Signed)
Message copied by Biagio Borg on Thu Sep 12, 2014 12:32 PM ------      Message from: Sharon Seller B      Created: Thu Sep 12, 2014  8:13 AM       Called the patient and spoke to his wife she stated he has been seeing Dr. Loanne Drilling.  She did state he has done nothing to help her husband and to please refer to someone else. ------

## 2014-09-12 NOTE — Telephone Encounter (Signed)
I have referred to a different endo

## 2014-09-13 ENCOUNTER — Ambulatory Visit: Payer: BC Managed Care – PPO | Admitting: Internal Medicine

## 2014-09-19 ENCOUNTER — Ambulatory Visit: Payer: BC Managed Care – PPO | Admitting: Internal Medicine

## 2015-02-11 ENCOUNTER — Other Ambulatory Visit: Payer: Self-pay | Admitting: Internal Medicine

## 2015-02-11 DIAGNOSIS — Z0000 Encounter for general adult medical examination without abnormal findings: Secondary | ICD-10-CM

## 2015-03-11 ENCOUNTER — Other Ambulatory Visit (INDEPENDENT_AMBULATORY_CARE_PROVIDER_SITE_OTHER): Payer: Federal, State, Local not specified - PPO

## 2015-03-11 DIAGNOSIS — E119 Type 2 diabetes mellitus without complications: Secondary | ICD-10-CM

## 2015-03-11 DIAGNOSIS — I10 Essential (primary) hypertension: Secondary | ICD-10-CM

## 2015-03-11 DIAGNOSIS — R319 Hematuria, unspecified: Secondary | ICD-10-CM | POA: Diagnosis not present

## 2015-03-11 DIAGNOSIS — Z79899 Other long term (current) drug therapy: Secondary | ICD-10-CM

## 2015-03-11 DIAGNOSIS — Z0000 Encounter for general adult medical examination without abnormal findings: Secondary | ICD-10-CM

## 2015-03-11 LAB — BASIC METABOLIC PANEL
BUN: 16 mg/dL (ref 6–23)
CO2: 30 mEq/L (ref 19–32)
Calcium: 9.8 mg/dL (ref 8.4–10.5)
Chloride: 107 mEq/L (ref 96–112)
Creatinine, Ser: 0.86 mg/dL (ref 0.40–1.50)
GFR: 117.48 mL/min (ref >60.00)
Glucose, Bld: 145 mg/dL — ABNORMAL HIGH (ref 70–99)
Potassium: 4.9 mEq/L (ref 3.5–5.1)
Sodium: 140 mEq/L (ref 135–145)

## 2015-03-11 LAB — CBC WITH DIFFERENTIAL/PLATELET
Basophils Absolute: 0 10*3/uL (ref 0.0–0.1)
Basophils Relative: 0.5 % (ref 0.0–3.0)
Eosinophils Absolute: 0.4 10*3/uL (ref 0.0–0.7)
Eosinophils Relative: 5.7 % — ABNORMAL HIGH (ref 0.0–5.0)
HCT: 41.6 % (ref 39.0–52.0)
Hemoglobin: 13.3 g/dL (ref 13.0–17.0)
Lymphocytes Relative: 39.9 % (ref 12.0–46.0)
Lymphs Abs: 2.9 10*3/uL (ref 0.7–4.0)
MCHC: 31.9 g/dL (ref 30.0–36.0)
MCV: 73.8 fl — ABNORMAL LOW (ref 78.0–100.0)
Monocytes Absolute: 0.5 10*3/uL (ref 0.1–1.0)
Monocytes Relative: 7.7 % (ref 3.0–12.0)
Neutro Abs: 3.3 10*3/uL (ref 1.4–7.7)
Neutrophils Relative %: 46.2 % (ref 43.0–77.0)
Platelets: 164 10*3/uL (ref 150.0–400.0)
RBC: 5.64 Mil/uL (ref 4.22–5.81)
RDW: 15.3 % (ref 11.5–15.5)
WBC: 7.2 10*3/uL (ref 4.0–10.5)

## 2015-03-11 LAB — HEPATIC FUNCTION PANEL
ALT: 27 U/L (ref 0–53)
AST: 24 U/L (ref 0–37)
Albumin: 4.2 g/dL (ref 3.5–5.2)
Alkaline Phosphatase: 48 U/L (ref 39–117)
Bilirubin, Direct: 0.1 mg/dL (ref 0.0–0.3)
Total Bilirubin: 0.3 mg/dL (ref 0.2–1.2)
Total Protein: 7.4 g/dL (ref 6.0–8.3)

## 2015-03-11 LAB — URINALYSIS, ROUTINE W REFLEX MICROSCOPIC
Bilirubin Urine: NEGATIVE
Hgb urine dipstick: NEGATIVE
Ketones, ur: NEGATIVE
Leukocytes, UA: NEGATIVE
Nitrite: NEGATIVE
Specific Gravity, Urine: 1.025 (ref 1.000–1.030)
Total Protein, Urine: NEGATIVE
Urine Glucose: 250 — AB
Urobilinogen, UA: 0.2 (ref 0.0–1.0)
pH: 6 (ref 5.0–8.0)

## 2015-03-11 LAB — PSA: PSA: 0.5 ng/mL (ref 0.10–4.00)

## 2015-03-11 LAB — LIPID PANEL
Cholesterol: 121 mg/dL (ref 0–200)
HDL: 40.1 mg/dL (ref >39.00)
LDL Cholesterol: 65 mg/dL (ref 0–99)
NonHDL: 80.9
Total CHOL/HDL Ratio: 3
Triglycerides: 82 mg/dL (ref 0.0–149.0)
VLDL: 16.4 mg/dL (ref 0.0–40.0)

## 2015-03-11 LAB — TSH: TSH: 2.7 u[IU]/mL (ref 0.35–4.50)

## 2015-03-12 ENCOUNTER — Ambulatory Visit: Payer: BC Managed Care – PPO | Admitting: Internal Medicine

## 2015-03-19 ENCOUNTER — Encounter: Payer: Self-pay | Admitting: Internal Medicine

## 2015-03-19 ENCOUNTER — Ambulatory Visit (INDEPENDENT_AMBULATORY_CARE_PROVIDER_SITE_OTHER): Payer: Federal, State, Local not specified - PPO | Admitting: Internal Medicine

## 2015-03-19 VITALS — BP 126/82 | HR 96 | Temp 98.2°F | Resp 18 | Ht 74.0 in | Wt 264.0 lb

## 2015-03-19 DIAGNOSIS — Z0000 Encounter for general adult medical examination without abnormal findings: Secondary | ICD-10-CM

## 2015-03-19 DIAGNOSIS — Z23 Encounter for immunization: Secondary | ICD-10-CM | POA: Diagnosis not present

## 2015-03-19 NOTE — Patient Instructions (Addendum)
You had the Pneumovax shot today  Please continue all other medications as before, and refills have been done if requested.  Please have the pharmacy call with any other refills you may need.  Please continue your efforts at being more active, low cholesterol diet, and weight control.  You are otherwise up to date with prevention measures today.  Please keep your appointments with your specialists as you may have planned  Please return in 1 year for your yearly visit, or sooner if needed, with Lab testing done 3-5 days before] 

## 2015-03-19 NOTE — Assessment & Plan Note (Signed)

## 2015-03-19 NOTE — Progress Notes (Signed)
Subjective:    Patient ID: [name], male    DOB: 10/30/1956, 59 y.o.   MRN: [id number]  HPI  Here for wellness and f/u;  Overall doing ok;  Pt denies Chest pain, worsening SOB, DOE, wheezing, orthopnea, PND, worsening LE edema, palpitations, dizziness or syncope.  Pt denies neurological change such as new headache, facial or extremity weakness.  Pt denies polydipsia, polyuria, or low sugar symptoms. Pt states overall good compliance with treatment and medications, good tolerability, and has been trying to follow appropriate diet.  Pt denies worsening depressive symptoms, suicidal ideation or panic. No fever, night sweats, wt loss, loss of appetite, or other constitutional symptoms.  Pt states good ability with ADL's, has low fall risk, home safety reviewed and adequate, no other significant changes in hearing or vision, and only occasionally active with exercise. Seeing endo at  [facility] in [facility], Dr Peri Jefferson; most recent a1c on mar 24 was 7.8  Has lost some wt with better diet Wt Readings from Last 3 Encounters:  03/19/15 264 lb (119.75 kg)  09/06/14 270 lb 2 oz (122.528 kg)  06/14/14 267 lb (121.11 kg)   Past Medical History  Diagnosis Date  . ABNORMAL CHEST XRAY 2009  . DIABETES MELLITUS, TYPE II   . ERECTILE DYSFUNCTION, MILD   . HYPERTENSION   . Overweight(278.02)   . PSEUDOFOLLICULITIS BARBAE   . PULMONARY NODULE, RIGHT UPPER LOBE 02/10/2009  . Biceps tendon rupture   . Hypogonadism, male   . Hypogonadism male 03/08/2014   Past Surgical History  Procedure Laterality Date  . Nasal polyp surgery      reports that he quit smoking about 23 years ago. He has never used smokeless tobacco. He reports that he does not drink alcohol or use illicit drugs. family history includes Cancer in his brother; Diabetes in his mother; Hypertension in his mother; Kidney failure in his father. There is no history of Coronary artery disease. No Known Allergies Current Outpatient  Prescriptions on File Prior to Visit  Medication Sig Dispense Refill  . aspirin 81 MG tablet Take 81 mg by mouth daily.      . Blood Glucose Monitoring Suppl (ACCU-CHEK AVIVA PLUS) W/DEVICE KIT 1 Device by Does not apply route once. 1 kit 0  . glucose blood (ACCU-CHEK AVIVA) test strip 1 each by Other route 2 (two) times daily. And lancets 2/day 100 each 12  . Insulin Detemir (LEVEMIR) 100 UNIT/ML Pen Inject 100 Units into the skin every morning. 45 mL 11  . Insulin Pen Needle 31G X 6 MM MISC Use 1 time daily as directed. 50 each 1  . testosterone cypionate (DEPOTESTOTERONE CYPIONATE) 200 MG/ML injection INJECT 2MLS INTRAMUSCULARLY EVERY 14 DAYS 10 mL 0   No current facility-administered medications on file prior to visit.   .   Review of Systems Constitutional: Negative for increased diaphoresis, other activity, appetite or siginficant weight change other than noted HENT: Negative for worsening hearing loss, ear pain, facial swelling, mouth sores and neck stiffness.   Eyes: Negative for other worsening pain, redness or visual disturbance.  Respiratory: Negative for shortness of breath and wheezing  Cardiovascular: Negative for chest pain and palpitations.  Gastrointestinal: Negative for diarrhea, blood in stool, abdominal distention or other pain Genitourinary: Negative for hematuria, flank pain or change in urine volume.  Musculoskeletal: Negative for myalgias or other joint complaints.  Skin: Negative for color change and wound or drainage.  Neurological: Negative for syncope and numbness. other  than noted Hematological: Negative for adenopathy. or other swelling Psychiatric/Behavioral: Negative for hallucinations, SI, self-injury, decreased concentration or other worsening agitation.      Objective:   Physical Exam BP 126/82 mmHg  Pulse 96  Temp(Src) 98.2 F (36.8 C) (Oral)  Resp 18  Ht 6' 2" (1.88 m)  Wt 264 lb (119.75 kg)  BMI 33.88 kg/m2  SpO2 96% VS noted,    Constitutional: Pt is oriented to person, place, and time. Appears well-developed and well-nourished, in no significant distress Head: Normocephalic and atraumatic.  Right Ear: External ear normal.  Left Ear: External ear normal.  Nose: Nose normal.  Mouth/Throat: Oropharynx is clear and moist.  Eyes: Conjunctivae and EOM are normal. Pupils are equal, round, and reactive to light.  Neck: Normal range of motion. Neck supple. No JVD present. No tracheal deviation present or significant neck LA or mass Cardiovascular: Normal rate, regular rhythm, normal heart sounds and intact distal pulses.   Pulmonary/Chest: Effort normal and breath sounds without rales or wheezing  Abdominal: Soft. Bowel sounds are normal. NT. No HSM  Musculoskeletal: Normal range of motion. Exhibits no edema.  Lymphadenopathy:  Has no cervical adenopathy.  Neurological: Pt is alert and oriented to person, place, and time. Pt has normal reflexes. No cranial nerve deficit. Motor grossly intact Skin: Skin is warm and dry. No rash noted.  Psychiatric:  Has normal mood and affect. Behavior is normal.  Lab Results  Component Value Date   WBC 7.2 03/11/2015   HGB 13.3 03/11/2015   HCT 41.6 03/11/2015   PLT 164.0 03/11/2015   GLUCOSE 145* 03/11/2015   CHOL 121 03/11/2015   TRIG 82.0 03/11/2015   HDL 40.10 03/11/2015   LDLCALC 65 03/11/2015   ALT 27 03/11/2015   AST 24 03/11/2015   NA 140 03/11/2015   K 4.9 03/11/2015   CL 107 03/11/2015   CREATININE 0.86 03/11/2015   BUN 16 03/11/2015   CO2 30 03/11/2015   TSH 2.70 03/11/2015   PSA 0.50 03/11/2015   HGBA1C 10.3* 09/06/2014       Assessment & Plan:

## 2015-03-19 NOTE — Progress Notes (Signed)
Pre visit review using our clinic review tool, if applicable. No additional management support is needed unless otherwise documented below in the visit note. 

## 2015-03-19 NOTE — Addendum Note (Signed)
Addended by: Valerie Salts on: 03/19/2015 04:50 PM   Modules accepted: Orders

## 2015-11-27 ENCOUNTER — Telehealth: Payer: Self-pay | Admitting: Internal Medicine

## 2015-11-27 NOTE — Telephone Encounter (Signed)
Pt states he seeing his urologist and he needs to have lab work for [employer] and he wants to do the lab work here. Can you please give him a call at [phone]

## 2015-11-28 NOTE — Telephone Encounter (Signed)
Very sorry, we do not normally order and be responsible for labs requested by physicians not associated with [facility]

## 2015-11-28 NOTE — Telephone Encounter (Signed)
Left msg to call back.

## 2015-12-02 NOTE — Telephone Encounter (Signed)
Pt called in explain it to pt

## 2015-12-15 ENCOUNTER — Other Ambulatory Visit (INDEPENDENT_AMBULATORY_CARE_PROVIDER_SITE_OTHER): Payer: Federal, State, Local not specified - PPO

## 2015-12-15 ENCOUNTER — Ambulatory Visit (INDEPENDENT_AMBULATORY_CARE_PROVIDER_SITE_OTHER): Payer: Federal, State, Local not specified - PPO | Admitting: Internal Medicine

## 2015-12-15 ENCOUNTER — Encounter: Payer: Self-pay | Admitting: Internal Medicine

## 2015-12-15 VITALS — BP 122/86 | HR 72 | Temp 97.7°F | Resp 20 | Ht 74.0 in | Wt 274.0 lb

## 2015-12-15 DIAGNOSIS — Z23 Encounter for immunization: Secondary | ICD-10-CM | POA: Diagnosis not present

## 2015-12-15 DIAGNOSIS — Z0000 Encounter for general adult medical examination without abnormal findings: Secondary | ICD-10-CM

## 2015-12-15 DIAGNOSIS — E119 Type 2 diabetes mellitus without complications: Secondary | ICD-10-CM

## 2015-12-15 DIAGNOSIS — E291 Testicular hypofunction: Secondary | ICD-10-CM

## 2015-12-15 DIAGNOSIS — I10 Essential (primary) hypertension: Secondary | ICD-10-CM

## 2015-12-15 LAB — BASIC METABOLIC PANEL
BUN: 11 mg/dL (ref 6–23)
CO2: 25 mEq/L (ref 19–32)
Calcium: 9.6 mg/dL (ref 8.4–10.5)
Chloride: 103 mEq/L (ref 96–112)
Creatinine, Ser: 0.9 mg/dL (ref 0.40–1.50)
GFR: 111.18 mL/min (ref >60.00)
Glucose, Bld: 254 mg/dL — ABNORMAL HIGH (ref 70–99)
Potassium: 4.6 mEq/L (ref 3.5–5.1)
Sodium: 140 mEq/L (ref 135–145)

## 2015-12-15 LAB — URINALYSIS, ROUTINE W REFLEX MICROSCOPIC
Bilirubin Urine: NEGATIVE
Hgb urine dipstick: NEGATIVE
Ketones, ur: NEGATIVE
Leukocytes, UA: NEGATIVE
Nitrite: NEGATIVE
RBC / HPF: NONE SEEN (ref 0–?)
Specific Gravity, Urine: >=1.03 — AB (ref 1.000–1.030)
Total Protein, Urine: NEGATIVE
Urine Glucose: >=1000 — AB
Urobilinogen, UA: 0.2 (ref 0.0–1.0)
pH: 5.5 (ref 5.0–8.0)

## 2015-12-15 LAB — MICROALBUMIN / CREATININE URINE RATIO
Creatinine,U: 288.9 mg/dL
Microalb Creat Ratio: 2.2 mg/g (ref 0.0–30.0)
Microalb, Ur: 6.4 mg/dL — ABNORMAL HIGH (ref 0.0–1.9)

## 2015-12-15 LAB — CBC WITH DIFFERENTIAL/PLATELET
Basophils Absolute: 0 10*3/uL (ref 0.0–0.1)
Basophils Relative: 0.5 % (ref 0.0–3.0)
Eosinophils Absolute: 0.2 10*3/uL (ref 0.0–0.7)
Eosinophils Relative: 2.9 % (ref 0.0–5.0)
HCT: 45.8 % (ref 39.0–52.0)
Hemoglobin: 14.4 g/dL (ref 13.0–17.0)
Lymphocytes Relative: 30.2 % (ref 12.0–46.0)
Lymphs Abs: 2.2 10*3/uL (ref 0.7–4.0)
MCHC: 31.4 g/dL (ref 30.0–36.0)
MCV: 74.4 fl — ABNORMAL LOW (ref 78.0–100.0)
Monocytes Absolute: 0.6 10*3/uL (ref 0.1–1.0)
Monocytes Relative: 8.3 % (ref 3.0–12.0)
Neutro Abs: 4.3 10*3/uL (ref 1.4–7.7)
Neutrophils Relative %: 58.1 % (ref 43.0–77.0)
Platelets: 165 10*3/uL (ref 150.0–400.0)
RBC: 6.15 Mil/uL — ABNORMAL HIGH (ref 4.22–5.81)
RDW: 14.9 % (ref 11.5–15.5)
WBC: 7.4 10*3/uL (ref 4.0–10.5)

## 2015-12-15 LAB — LIPID PANEL
Cholesterol: 143 mg/dL (ref 0–200)
HDL: 36.1 mg/dL — ABNORMAL LOW (ref >39.00)
LDL Cholesterol: 88 mg/dL (ref 0–99)
NonHDL: 106.61
Total CHOL/HDL Ratio: 4
Triglycerides: 93 mg/dL (ref 0.0–149.0)
VLDL: 18.6 mg/dL (ref 0.0–40.0)

## 2015-12-15 LAB — HEMOGLOBIN A1C: Hgb A1c MFr Bld: 9.9 % — ABNORMAL HIGH (ref 4.6–6.5)

## 2015-12-15 LAB — HEPATIC FUNCTION PANEL
ALT: 42 U/L (ref 0–53)
AST: 29 U/L (ref 0–37)
Albumin: 4.6 g/dL (ref 3.5–5.2)
Alkaline Phosphatase: 56 U/L (ref 39–117)
Bilirubin, Direct: 0.1 mg/dL (ref 0.0–0.3)
Total Bilirubin: 0.4 mg/dL (ref 0.2–1.2)
Total Protein: 7.9 g/dL (ref 6.0–8.3)

## 2015-12-15 LAB — TESTOSTERONE: Testosterone: 258.16 ng/dL — ABNORMAL LOW (ref 300.00–890.00)

## 2015-12-15 LAB — TSH: TSH: 1.52 u[IU]/mL (ref 0.35–4.50)

## 2015-12-15 LAB — PSA: PSA: 0.86 ng/mL (ref 0.10–4.00)

## 2015-12-15 NOTE — Progress Notes (Signed)
Subjective:    Patient ID: [name], male    DOB: 01-Dec-1955, 60 y.o.   MRN: [id number]  HPI  Here for wellness and f/u;  Overall doing ok;  Pt denies Chest pain, worsening SOB, DOE, wheezing, orthopnea, PND, worsening LE edema, palpitations, dizziness or syncope.  Pt denies neurological change such as new headache, facial or extremity weakness.  Pt denies polydipsia, polyuria, or low sugar symptoms. Pt states overall good compliance with treatment and medications, good tolerability, and has been trying to follow appropriate diet.  Pt denies worsening depressive symptoms, suicidal ideation or panic. No fever, night sweats, wt loss, loss of appetite, or other constitutional symptoms.  Pt states good ability with ADL's, has low fall risk, home safety reviewed and adequate, no other significant changes in hearing or vision, and only occasionally active with exercise.  Asks for prolactin level due to finding of low T per urology.  Sees endo for DM at [facility].  Due for Hep C. Past Medical History  Diagnosis Date  . ABNORMAL CHEST XRAY 2009  . DIABETES MELLITUS, TYPE II   . ERECTILE DYSFUNCTION, MILD   . HYPERTENSION   . Overweight(278.02)   . PSEUDOFOLLICULITIS BARBAE   . PULMONARY NODULE, RIGHT UPPER LOBE 02/10/2009  . Biceps tendon rupture   . Hypogonadism, male   . Hypogonadism male 03/08/2014   Past Surgical History  Procedure Laterality Date  . Nasal polyp surgery      reports that he quit smoking about 24 years ago. He has never used smokeless tobacco. He reports that he does not drink alcohol or use illicit drugs. family history includes Cancer in his brother; Diabetes in his mother; Hypertension in his mother; Kidney failure in his father. There is no history of Coronary artery disease. No Known Allergies Current Outpatient Prescriptions on File Prior to Visit  Medication Sig Dispense Refill  . Blood Glucose Monitoring Suppl (ACCU-CHEK AVIVA PLUS) W/DEVICE KIT 1 Device by  Does not apply route once. 1 kit 0  . glucose blood (ACCU-CHEK AVIVA) test strip 1 each by Other route 2 (two) times daily. And lancets 2/day 100 each 12  . metFORMIN (GLUCOPHAGE) 500 MG tablet Take 500 mg by mouth 2 (two) times daily with a meal.    . testosterone cypionate (DEPOTESTOTERONE CYPIONATE) 200 MG/ML injection INJECT 2MLS INTRAMUSCULARLY EVERY 14 DAYS 10 mL 0  . Insulin Detemir (LEVEMIR) 100 UNIT/ML Pen Inject 100 Units into the skin every morning. (Patient not taking: Reported on 12/15/2015) 45 mL 11  . Insulin Pen Needle 31G X 6 MM MISC Use 1 time daily as directed. (Patient not taking: Reported on 12/15/2015) 50 each 1   No current facility-administered medications on file prior to visit.   Review of Systems Constitutional: Negative for increased diaphoresis, other activity, appetite or siginficant weight change other than noted HENT: Negative for worsening hearing loss, ear pain, facial swelling, mouth sores and neck stiffness.   Eyes: Negative for other worsening pain, redness or visual disturbance.  Respiratory: Negative for shortness of breath and wheezing  Cardiovascular: Negative for chest pain and palpitations.  Gastrointestinal: Negative for diarrhea, blood in stool, abdominal distention or other pain Genitourinary: Negative for hematuria, flank pain or change in urine volume.  Musculoskeletal: Negative for myalgias or other joint complaints.  Skin: Negative for color change and wound or drainage.  Neurological: Negative for syncope and numbness. other than noted Hematological: Negative for adenopathy. or other swelling Psychiatric/Behavioral: Negative for hallucinations, SI, self-injury, decreased  concentration or other worsening agitation.      Objective:   Physical Exam BP 122/86 mmHg  Pulse 72  Temp(Src) 97.7 F (36.5 C) (Oral)  Resp 20  Ht _0  (1.88 m)  Wt 274 lb (124.286 kg)  BMI 35.16 kg/m2  SpO2 97% VS noted,  Constitutional: Pt is oriented to  person, place, and time. Appears well-developed and well-nourished, in no significant distress Head: Normocephalic and atraumatic.  Right Ear: External ear normal.  Left Ear: External ear normal.  Nose: Nose normal.  Mouth/Throat: Oropharynx is clear and moist.  Eyes: Conjunctivae and EOM are normal. Pupils are equal, round, and reactive to light.  Neck: Normal range of motion. Neck supple. No JVD present. No tracheal deviation present or significant neck LA or mass Cardiovascular: Normal rate, regular rhythm, normal heart sounds and intact distal pulses.   Pulmonary/Chest: Effort normal and breath sounds without rales or wheezing  Abdominal: Soft. Bowel sounds are normal. NT. No HSM  Musculoskeletal: Normal range of motion. Exhibits no edema.  Lymphadenopathy:  Has no cervical adenopathy.  Neurological: Pt is alert and oriented to person, place, and time. Pt has normal reflexes. No cranial nerve deficit. Motor grossly intact Skin: Skin is warm and dry. No rash noted.  Psychiatric:  Has normal mood and affect. Behavior is normal.      Assessment & Plan:

## 2015-12-15 NOTE — Patient Instructions (Addendum)
You had the new Prevnar pneumonia shot  Please continue all other medications as before, and refills have been done if requested.  Please have the pharmacy call with any other refills you may need.  Please continue your efforts at being more active, low cholesterol diet, and weight control.  You are otherwise up to date with prevention measures today.  Please keep your appointments with your specialists as you may have planned  Please go to the LAB in the Basement (turn left off the elevator) for the tests to be done today  You will be contacted by phone if any changes need to be made immediately.  Otherwise, you will receive a letter about your results with an explanation, but please check with MyChart first.  Please remember to sign up for MyChart if you have not done so, as this will be important to you in the future with finding out test results, communicating by private email, and scheduling acute appointments online when needed.  Please return in 1 year for your yearly visit, or sooner if needed, with Lab testing done 3-5 days before  

## 2015-12-15 NOTE — Addendum Note (Signed)
Addended by: Della Goo C on: 12/15/2015 09:41 AM   Modules accepted: Orders

## 2015-12-15 NOTE — Progress Notes (Signed)
Pre visit review using our clinic review tool, if applicable. No additional management support is needed unless otherwise documented below in the visit note. 

## 2015-12-15 NOTE — Assessment & Plan Note (Signed)
stable overall by history and exam, recent data reviewed with pt, and pt to continue medical treatment as before,  to f/u any worsening symptoms or concerns Lab Results  Component Value Date   HGBA1C 10.3* 09/06/2014   Pt states much improved with endo at [facility]

## 2015-12-15 NOTE — Assessment & Plan Note (Signed)

## 2015-12-15 NOTE — Assessment & Plan Note (Signed)
stable overall by history and exam, recent data reviewed with pt, and pt to continue medical treatment as before,  to f/u any worsening symptoms or concerns BP Readings from Last 3 Encounters:  12/15/15 122/86  03/19/15 126/82  09/06/14 142/78

## 2015-12-15 NOTE — Assessment & Plan Note (Signed)
For testos level, fu urology

## 2015-12-16 LAB — PROLACTIN: Prolactin: 5.8 ng/mL (ref 2.1–17.1)

## 2015-12-16 LAB — HEPATITIS C ANTIBODY: HCV Ab: NEGATIVE

## 2016-03-05 ENCOUNTER — Encounter: Payer: Self-pay | Admitting: Gastroenterology

## 2016-03-19 ENCOUNTER — Encounter: Payer: Federal, State, Local not specified - PPO | Admitting: Internal Medicine

## 2016-03-25 DIAGNOSIS — B078 Other viral warts: Secondary | ICD-10-CM | POA: Diagnosis not present

## 2016-03-25 DIAGNOSIS — E1165 Type 2 diabetes mellitus with hyperglycemia: Secondary | ICD-10-CM | POA: Diagnosis not present

## 2016-03-25 DIAGNOSIS — Z6832 Body mass index (BMI) 32.0-32.9, adult: Secondary | ICD-10-CM | POA: Diagnosis not present

## 2016-03-25 DIAGNOSIS — D3610 Benign neoplasm of peripheral nerves and autonomic nervous system, unspecified: Secondary | ICD-10-CM | POA: Diagnosis not present

## 2016-03-25 DIAGNOSIS — E6609 Other obesity due to excess calories: Secondary | ICD-10-CM | POA: Diagnosis not present

## 2016-03-25 DIAGNOSIS — I10 Essential (primary) hypertension: Secondary | ICD-10-CM | POA: Diagnosis not present

## 2016-03-25 DIAGNOSIS — L918 Other hypertrophic disorders of the skin: Secondary | ICD-10-CM | POA: Diagnosis not present

## 2016-03-25 DIAGNOSIS — L738 Other specified follicular disorders: Secondary | ICD-10-CM | POA: Diagnosis not present

## 2016-06-06 ENCOUNTER — Emergency Department (HOSPITAL_BASED_OUTPATIENT_CLINIC_OR_DEPARTMENT_OTHER)
Admission: EM | Admit: 2016-06-06 | Discharge: 2016-06-06 | Disposition: A | Discharge status: Home / Self Care | Payer: Federal, State, Local not specified - PPO | Attending: Emergency Medicine | Admitting: Emergency Medicine

## 2016-06-06 ENCOUNTER — Encounter (HOSPITAL_BASED_OUTPATIENT_CLINIC_OR_DEPARTMENT_OTHER): Payer: Self-pay | Admitting: Emergency Medicine

## 2016-06-06 DIAGNOSIS — E119 Type 2 diabetes mellitus without complications: Secondary | ICD-10-CM | POA: Insufficient documentation

## 2016-06-06 DIAGNOSIS — Z7984 Long term (current) use of oral hypoglycemic drugs: Secondary | ICD-10-CM | POA: Diagnosis not present

## 2016-06-06 DIAGNOSIS — Z87891 Personal history of nicotine dependence: Secondary | ICD-10-CM | POA: Diagnosis not present

## 2016-06-06 DIAGNOSIS — K5900 Constipation, unspecified: Secondary | ICD-10-CM | POA: Diagnosis not present

## 2016-06-06 DIAGNOSIS — Z794 Long term (current) use of insulin: Secondary | ICD-10-CM | POA: Diagnosis not present

## 2016-06-06 DIAGNOSIS — I10 Essential (primary) hypertension: Secondary | ICD-10-CM | POA: Diagnosis not present

## 2016-06-06 DIAGNOSIS — R109 Unspecified abdominal pain: Secondary | ICD-10-CM | POA: Insufficient documentation

## 2016-06-06 MED ORDER — POLYETHYLENE GLYCOL 3350 17 GM/SCOOP PO POWD: 17.0000 g | Freq: Every day | ORAL | Status: DC

## 2016-06-06 MED ORDER — DOCUSATE SODIUM 100 MG PO CAPS: 100.0000 mg | ORAL_CAPSULE | Freq: Two times a day (BID) | ORAL | Status: DC

## 2016-06-06 NOTE — ED Provider Notes (Signed)
CSN: [id number]     Arrival date & time 06/06/16  2012 History  By signing my name below, I, Randa Evens, attest that this documentation has been prepared under the direction and in the presence of [employer], PA-C. Electronically Signed: Randa Evens, ED Scribe. 06/06/2016. 9:32 PM.    Chief Complaint  Patient presents with  . Constipation   Patient is a 60 y.o. male presenting with constipation. The history is provided by the patient. No language interpreter was used.  Constipation  HPI Comments: [name] is a 60 y.o. male who presents to the Emergency Department complaining of constipation onset 2 days prior. He reports associated abdominal pain that began 4 days prior. He describes the abdominal pain as feeling bloated and uncomfortable that's worse in the mornings. He states that his last normal BM was 2 days prior. He states that he has tried a magnesium, laxative and Pepto bismol with no relief. Denies Hx of abdominal surgeries. Pt reports HX of constipation 1 year prior that resolved with castor oil. Denies vomiting, blood in stool, fever, chills, pain with bowel movements, or urinary symptoms.     Past Medical History  Diagnosis Date  . ABNORMAL CHEST XRAY 2009  . DIABETES MELLITUS, TYPE II   . ERECTILE DYSFUNCTION, MILD   . HYPERTENSION   . Overweight(278.02)   . PSEUDOFOLLICULITIS BARBAE   . PULMONARY NODULE, RIGHT UPPER LOBE 02/10/2009  . Biceps tendon rupture   . Hypogonadism, male   . Hypogonadism male 03/08/2014   Past Surgical History  Procedure Laterality Date  . Nasal polyp surgery     Family History  Problem Relation Age of Onset  . Hypertension Mother   . Diabetes Mother   . Cancer Brother     Pancreatic  . Kidney failure Father   . Coronary artery disease Neg Hx    Social History  Substance Use Topics  . Smoking status: Former Smoker    Quit date: 10/20/1991  . Smokeless tobacco: Never Used  . Alcohol Use: No    Review of Systems   Gastrointestinal: Positive for constipation.   A complete 10 system review of systems was obtained and all systems are negative except as noted in the HPI and PMH.     Allergies  Review of patient's allergies indicates no known allergies.  Home Medications   Prior to Admission medications   Medication Sig Start Date End Date Taking? Authorizing Provider  Blood Glucose Monitoring Suppl (ACCU-CHEK AVIVA PLUS) W/DEVICE KIT 1 Device by Does not apply route once. 06/14/14   Renato Shin, MD  glucose blood (ACCU-CHEK AVIVA) test strip 1 each by Other route 2 (two) times daily. And lancets 2/day 06/14/14   Renato Shin, MD  Insulin Detemir (LEVEMIR) 100 UNIT/ML Pen Inject 100 Units into the skin every morning. Patient not taking: Reported on 12/15/2015 06/14/14   Renato Shin, MD  Insulin Pen Needle 31G X 6 MM MISC Use 1 time daily as directed. Patient not taking: Reported on 12/15/2015 10/01/13   Renato Shin, MD  metFORMIN (GLUCOPHAGE) 500 MG tablet Take 500 mg by mouth 2 (two) times daily with a meal.    Historical Provider, MD  testosterone cypionate (DEPOTESTOTERONE CYPIONATE) 200 MG/ML injection INJECT 2MLS INTRAMUSCULARLY EVERY 14 DAYS 05/30/13   Neena Rhymes, MD   BP 163/101 mmHg  Pulse 100  Temp(Src) 98.6 F (37 C) (Oral)  Resp 18  Ht _0  (1.88 m)  Wt 260 lb (117.935 kg)  BMI  33.37 kg/m2  SpO2 99%   Physical Exam  Constitutional: He is oriented to person, place, and time. He appears well-developed and well-nourished. No distress.  HENT:  Head: Normocephalic and atraumatic.  Eyes: Conjunctivae and EOM are normal.  Neck: Neck supple. No tracheal deviation present.  Cardiovascular: Normal rate, regular rhythm and normal heart sounds.   Pulmonary/Chest: Effort normal and breath sounds normal. No respiratory distress. He has no wheezes. He has no rales.  Abdominal: Soft. Bowel sounds are normal. He exhibits no distension. There is no tenderness. There is no rebound and no  guarding.  Musculoskeletal: Normal range of motion.  Neurological: He is alert and oriented to person, place, and time.  Skin: Skin is warm and dry.  Psychiatric: He has a normal mood and affect. His behavior is normal.  Nursing note and vitals reviewed.   ED Course  Procedures (including critical care time) DIAGNOSTIC STUDIES: Oxygen Saturation is 99% on RA, normal by my interpretation.    COORDINATION OF CARE: 9:33 PM-Discussed treatment plan which includes enema with pt at bedside and pt agreed to plan.  10:44 PM- re assessed pt and has had 2 large bowel movements after enema. Pain has significantly improved.    Labs Review Labs Reviewed - No data to display  Imaging Review No results found.    EKG Interpretation None      MDM   Final diagnoses:  None  Patient presents today with a chief complaint of constipation.  He states that he feels bloated and like he has to have a BM, but cannot.  Abdomen is non distended, non tender, with normal bowel sounds. No history of abdominal surgeries or hernias.  No vomiting.  Therefore, doubt SBO.  Do not feel that any imaging is indicated at this time.  Patient given enema and had a couple of bowel movements after that.  He states that the bloated feeling in his abdomen improved after the bowel movements.  Feel that he is stable for discharge.  Return precautions given.    I personally performed the services described in this documentation, which was scribed in my presence. The recorded information has been reviewed and is accurate.       Hyman Bible, PA-C 06/08/16 [id number]  Merrily Pew, MD 06/09/16 [id number]

## 2016-06-06 NOTE — ED Notes (Signed)
Patient has had 2 stools after enema administration.  Patient states he feels somewhat better but feels like he can go again.  Patient reports black watery stools.

## 2016-06-06 NOTE — ED Notes (Signed)
Pt in c/o constipation x 2 days, home laxatives with no relief, feels bloated. Ambulatory in NAD.

## 2016-06-29 ENCOUNTER — Telehealth: Payer: Self-pay | Admitting: Internal Medicine

## 2016-06-29 DIAGNOSIS — Z299 Encounter for prophylactic measures, unspecified: Secondary | ICD-10-CM

## 2016-06-29 NOTE — Telephone Encounter (Signed)
Patient has upcoming CPE.  He is requesting labs to be entered ahead of time for him to have done.

## 2016-06-30 NOTE — Telephone Encounter (Signed)
Orders placed for lab work. 

## 2016-07-06 ENCOUNTER — Other Ambulatory Visit (INDEPENDENT_AMBULATORY_CARE_PROVIDER_SITE_OTHER): Payer: Federal, State, Local not specified - PPO

## 2016-07-06 DIAGNOSIS — Z299 Encounter for prophylactic measures, unspecified: Secondary | ICD-10-CM | POA: Diagnosis not present

## 2016-07-06 DIAGNOSIS — E291 Testicular hypofunction: Secondary | ICD-10-CM | POA: Diagnosis not present

## 2016-07-06 LAB — LIPID PANEL
Cholesterol: 155 mg/dL (ref 0–200)
HDL: 42.9 mg/dL (ref >39.00)
LDL Cholesterol: 99 mg/dL (ref 0–99)
NonHDL: 111.7
Total CHOL/HDL Ratio: 4
Triglycerides: 66 mg/dL (ref 0.0–149.0)
VLDL: 13.2 mg/dL (ref 0.0–40.0)

## 2016-07-06 LAB — HEPATIC FUNCTION PANEL
ALT: 39 U/L (ref 0–53)
AST: 32 U/L (ref 0–37)
Albumin: 4.4 g/dL (ref 3.5–5.2)
Alkaline Phosphatase: 52 U/L (ref 39–117)
Bilirubin, Direct: 0.2 mg/dL (ref 0.0–0.3)
Total Bilirubin: 0.8 mg/dL (ref 0.2–1.2)
Total Protein: 7.6 g/dL (ref 6.0–8.3)

## 2016-07-06 LAB — HEMOGLOBIN A1C: Hgb A1c MFr Bld: 9.6 % — ABNORMAL HIGH (ref 4.6–6.5)

## 2016-07-06 LAB — PSA: PSA: 0.99 ng/mL (ref 0.10–4.00)

## 2016-07-06 LAB — CBC
HCT: 47.4 % (ref 39.0–52.0)
Hemoglobin: 15 g/dL (ref 13.0–17.0)
MCHC: 31.7 g/dL (ref 30.0–36.0)
MCV: 73.4 fl — ABNORMAL LOW (ref 78.0–100.0)
Platelets: 141 10*3/uL — ABNORMAL LOW (ref 150.0–400.0)
RBC: 6.46 Mil/uL — ABNORMAL HIGH (ref 4.22–5.81)
RDW: 15.7 % — ABNORMAL HIGH (ref 11.5–15.5)
WBC: 7.7 10*3/uL (ref 4.0–10.5)

## 2016-07-06 LAB — BASIC METABOLIC PANEL
BUN: 14 mg/dL (ref 6–23)
CO2: 29 mEq/L (ref 19–32)
Calcium: 9.9 mg/dL (ref 8.4–10.5)
Chloride: 101 mEq/L (ref 96–112)
Creatinine, Ser: 0.92 mg/dL (ref 0.40–1.50)
GFR: 108.19 mL/min (ref >60.00)
Glucose, Bld: 220 mg/dL — ABNORMAL HIGH (ref 70–99)
Potassium: 4.7 mEq/L (ref 3.5–5.1)
Sodium: 137 mEq/L (ref 135–145)

## 2016-07-06 LAB — MICROALBUMIN / CREATININE URINE RATIO
Creatinine,U: 276.9 mg/dL
Microalb Creat Ratio: 1 mg/g (ref 0.0–30.0)
Microalb, Ur: 2.7 mg/dL — ABNORMAL HIGH (ref 0.0–1.9)

## 2016-07-06 LAB — TSH: TSH: 2.27 u[IU]/mL (ref 0.35–4.50)

## 2016-07-13 DIAGNOSIS — N5201 Erectile dysfunction due to arterial insufficiency: Secondary | ICD-10-CM | POA: Diagnosis not present

## 2016-07-13 DIAGNOSIS — E291 Testicular hypofunction: Secondary | ICD-10-CM | POA: Diagnosis not present

## 2016-07-14 ENCOUNTER — Encounter: Payer: Self-pay | Admitting: Internal Medicine

## 2016-07-14 ENCOUNTER — Ambulatory Visit (INDEPENDENT_AMBULATORY_CARE_PROVIDER_SITE_OTHER): Payer: Federal, State, Local not specified - PPO | Admitting: Internal Medicine

## 2016-07-14 VITALS — BP 132/78 | HR 84 | Resp 20 | Wt 264.0 lb

## 2016-07-14 DIAGNOSIS — E119 Type 2 diabetes mellitus without complications: Secondary | ICD-10-CM | POA: Diagnosis not present

## 2016-07-14 DIAGNOSIS — E785 Hyperlipidemia, unspecified: Secondary | ICD-10-CM

## 2016-07-14 DIAGNOSIS — R6889 Other general symptoms and signs: Secondary | ICD-10-CM

## 2016-07-14 DIAGNOSIS — I10 Essential (primary) hypertension: Secondary | ICD-10-CM | POA: Diagnosis not present

## 2016-07-14 DIAGNOSIS — Z0001 Encounter for general adult medical examination with abnormal findings: Secondary | ICD-10-CM | POA: Diagnosis not present

## 2016-07-14 DIAGNOSIS — T888 Other specified complications of surgical and medical care, not elsewhere classified: Secondary | ICD-10-CM | POA: Diagnosis not present

## 2016-07-14 HISTORY — DX: Hyperlipidemia, unspecified: E78.5

## 2016-07-14 MED ORDER — LISINOPRIL 20 MG PO TABS: 20.0000 mg | ORAL_TABLET | Freq: Every day | ORAL | 3 refills | Status: DC

## 2016-07-14 MED ORDER — METFORMIN HCL 500 MG PO TABS: 500.0000 mg | ORAL_TABLET | Freq: Four times a day (QID) | ORAL | 3 refills | Status: DC

## 2016-07-14 MED ORDER — GLIMEPIRIDE 4 MG PO TABS: 4.0000 mg | ORAL_TABLET | Freq: Every day | ORAL | 3 refills | Status: DC

## 2016-07-14 MED ORDER — PIOGLITAZONE HCL 30 MG PO TABS: 30.0000 mg | ORAL_TABLET | Freq: Every day | ORAL | 3 refills | Status: DC

## 2016-07-14 MED ORDER — ASPIRIN EC 81 MG PO TBEC: 81.0000 mg | DELAYED_RELEASE_TABLET | Freq: Every day | ORAL | 11 refills | Status: DC

## 2016-07-14 MED ORDER — GLIMEPIRIDE 4 MG PO TABS: ORAL_TABLET | ORAL | 3 refills | Status: DC

## 2016-07-14 NOTE — Assessment & Plan Note (Signed)
Goal ldl < 70, consider statin next visit if not improved Lab Results  Component Value Date   LDLCALC 99 07/06/2016

## 2016-07-14 NOTE — Assessment & Plan Note (Signed)
stable overall by history and exam, recent data reviewed with pt, and pt to continue medical treatment as before,  to f/u any worsening symptoms or concerns BP Readings from Last 3 Encounters:  07/14/16 132/78  06/06/16 140/66  12/15/15 122/86

## 2016-07-14 NOTE — Patient Instructions (Addendum)
Please take all new medication as prescribed - the actos (generic) at 30 mg per day  Please start Aspirin 81 mg - 1 per day   Please continue all other medications as before, and refills have been done if requested.  Please have the pharmacy call with any other refills you may need.  Please continue your efforts at being more active, low cholesterol diet, and weight control.  You are otherwise up to date with prevention measures today.  Please keep your appointments with your specialists as you may have planned  Please return in 3 months, or sooner if needed, with Lab testing done 3-5 days before

## 2016-07-14 NOTE — Assessment & Plan Note (Addendum)
Uncontrolled, to add actos 30 qd, cont all other meds, pt no longer seeing endo, ok for f/u here in 3 mo  In addition to the time spent performing CPE, I spent an additional 25 minutes face to face,in which greater than 50% of this time was spent in counseling and coordination of care for patient's acute illness as documented.

## 2016-07-14 NOTE — Progress Notes (Signed)
Subjective:    Patient ID: [name], male    DOB: June 01, 1956, 60 y.o.   MRN: [id number]  HPI  Here for wellness and f/u;  Overall doing ok;  Pt denies Chest pain, worsening SOB, DOE, wheezing, orthopnea, PND, worsening LE edema, palpitations, dizziness or syncope.  Pt denies neurological change such as new headache, facial or extremity weakness.  Pt denies polydipsia, polyuria, or low sugar symptoms. Pt states overall good compliance with treatment and medications, good tolerability, and has been trying to follow appropriate diet.  Pt denies worsening depressive symptoms, suicidal ideation or panic. No fever, night sweats, wt loss, loss of appetite, or other constitutional symptoms.  Pt states good ability with ADL's, has low fall risk, home safety reviewed and adequate, no other significant changes in hearing or vision, and only occasionally active with exercise.  Declines flu shot for now.  Admits to poor diet recently, not really following the Dm diet.  Pt denies polydipsia, polyuria, or low sugar symptoms such as weakness or confusion improved with po intake.  Pt states overall good compliance with meds, trying to follow lower cholesterol, diabetic diet, wt overall stable but little exercise however.    Past Medical History:  Diagnosis Date  . ABNORMAL CHEST XRAY 2009  . Biceps tendon rupture   . DIABETES MELLITUS, TYPE II   . ERECTILE DYSFUNCTION, MILD   . HYPERTENSION   . Hypogonadism male 03/08/2014  . Hypogonadism, male   . Overweight(278.02)   . PSEUDOFOLLICULITIS BARBAE   . PULMONARY NODULE, RIGHT UPPER LOBE 02/10/2009   Past Surgical History:  Procedure Laterality Date  . NASAL POLYP SURGERY      reports that he quit smoking about 24 years ago. He has never used smokeless tobacco. He reports that he does not drink alcohol or use drugs. family history includes Cancer in his brother; Diabetes in his mother; Hypertension in his mother; Kidney failure in his father. No Known  Allergies No current outpatient prescriptions on file prior to visit.   No current facility-administered medications on file prior to visit.    Review of Systems Constitutional: Negative for increased diaphoresis, or other activity, appetite or siginficant weight change other than noted HENT: Negative for worsening hearing loss, ear pain, facial swelling, mouth sores and neck stiffness.   Eyes: Negative for other worsening pain, redness or visual disturbance.  Respiratory: Negative for choking or stridor Cardiovascular: Negative for other chest pain and palpitations.  Gastrointestinal: Negative for worsening diarrhea, blood in stool, or abdominal distention Genitourinary: Negative for hematuria, flank pain or change in urine volume.  Musculoskeletal: Negative for myalgias or other joint complaints.  Skin: Negative for other color change and wound or drainage.  Neurological: Negative for syncope and numbness. other than noted Hematological: Negative for adenopathy. or other swelling Psychiatric/Behavioral: Negative for hallucinations, SI, self-injury, decreased concentration or other worsening agitation.      Objective:   Physical Exam BP 132/78   Pulse 84   Resp 20   Wt 264 lb (119.7 kg)   SpO2 98%   BMI 33.90 kg/m  VS noted,  Constitutional: Pt is oriented to person, place, and time. Appears well-developed and well-nourished, in no significant distress Head: Normocephalic and atraumatic  Eyes: Conjunctivae and EOM are normal. Pupils are equal, round, and reactive to light Right Ear: External ear normal.  Left Ear: External ear normal Nose: Nose normal.  Mouth/Throat: Oropharynx is clear and moist  Neck: Normal range of motion. Neck supple. No  JVD present. No tracheal deviation present or significant neck LA or mass Cardiovascular: Normal rate, regular rhythm, normal heart sounds and intact distal pulses.   Pulmonary/Chest: Effort normal and breath sounds without rales or  wheezing  Abdominal: Soft. Bowel sounds are normal. NT. No HSM  Musculoskeletal: Normal range of motion. Exhibits no edema Lymphadenopathy: Has no cervical adenopathy.  Neurological: Pt is alert and oriented to person, place, and time. Pt has normal reflexes. No cranial nerve deficit. Motor grossly intact Skin: Skin is warm and dry. No rash noted or new ulcers Psychiatric:  Has normal mood and affect. Behavior is normal.      Assessment & Plan:

## 2016-07-14 NOTE — Assessment & Plan Note (Signed)

## 2016-07-14 NOTE — Progress Notes (Signed)
Pre visit review using our clinic review tool, if applicable. No additional management support is needed unless otherwise documented below in the visit note. 

## 2016-08-16 ENCOUNTER — Ambulatory Visit: Payer: Self-pay

## 2016-08-16 ENCOUNTER — Telehealth: Payer: Self-pay | Admitting: Family Medicine

## 2016-08-16 DIAGNOSIS — M7989 Other specified soft tissue disorders: Secondary | ICD-10-CM | POA: Diagnosis not present

## 2016-08-16 DIAGNOSIS — S86111A Strain of other muscle(s) and tendon(s) of posterior muscle group at lower leg level, right leg, initial encounter: Secondary | ICD-10-CM | POA: Diagnosis not present

## 2016-08-16 DIAGNOSIS — M79661 Pain in right lower leg: Secondary | ICD-10-CM | POA: Diagnosis not present

## 2016-08-16 DIAGNOSIS — S8981XA Other specified injuries of right lower leg, initial encounter: Secondary | ICD-10-CM | POA: Diagnosis not present

## 2016-08-16 NOTE — Telephone Encounter (Signed)
This is a patient of Dr. Jenny Reichmann.  He states he has possibly pulled a muscle in his right leg and states it is swollen.  He called in requesting to see Dr. Tamala Julian.  Patient states this happened on Wed or Thurs.  Please follow up with patient in regard.

## 2016-08-16 NOTE — Telephone Encounter (Signed)
lmovm for pt to return call.  

## 2016-08-17 NOTE — Telephone Encounter (Signed)
Spoke with pt. He stated that he made an appt with [facility].

## 2016-08-18 ENCOUNTER — Telehealth: Payer: Self-pay | Admitting: Internal Medicine

## 2016-08-18 NOTE — Telephone Encounter (Signed)
Pt stating he got a bill from our office stating the code was wrong. Please check and call pt back

## 2016-08-30 NOTE — Telephone Encounter (Signed)
Spoke with patient 9/22 and advised I was working on it. Sent email to billing for help. Sent 2nd request to billing 08/30/16

## 2016-09-03 DIAGNOSIS — S86111D Strain of other muscle(s) and tendon(s) of posterior muscle group at lower leg level, right leg, subsequent encounter: Secondary | ICD-10-CM | POA: Diagnosis not present

## 2016-09-17 DIAGNOSIS — M79661 Pain in right lower leg: Secondary | ICD-10-CM | POA: Diagnosis not present

## 2016-09-17 DIAGNOSIS — S86111D Strain of other muscle(s) and tendon(s) of posterior muscle group at lower leg level, right leg, subsequent encounter: Secondary | ICD-10-CM | POA: Diagnosis not present

## 2016-09-29 NOTE — Telephone Encounter (Signed)
Notified [name] that the balance ($237) from 07/06/16 will be written off due to coding error.

## 2016-09-30 DIAGNOSIS — N5201 Erectile dysfunction due to arterial insufficiency: Secondary | ICD-10-CM | POA: Diagnosis not present

## 2016-10-14 ENCOUNTER — Ambulatory Visit: Payer: Federal, State, Local not specified - PPO | Admitting: Internal Medicine

## 2016-10-28 ENCOUNTER — Ambulatory Visit (INDEPENDENT_AMBULATORY_CARE_PROVIDER_SITE_OTHER): Payer: Federal, State, Local not specified - PPO | Admitting: Internal Medicine

## 2016-10-28 ENCOUNTER — Encounter: Payer: Self-pay | Admitting: Internal Medicine

## 2016-10-28 VITALS — BP 140/80 | HR 76 | Temp 98.8°F | Resp 20 | Wt 264.0 lb

## 2016-10-28 DIAGNOSIS — I10 Essential (primary) hypertension: Secondary | ICD-10-CM | POA: Diagnosis not present

## 2016-10-28 DIAGNOSIS — E785 Hyperlipidemia, unspecified: Secondary | ICD-10-CM | POA: Diagnosis not present

## 2016-10-28 DIAGNOSIS — E119 Type 2 diabetes mellitus without complications: Secondary | ICD-10-CM | POA: Diagnosis not present

## 2016-10-28 DIAGNOSIS — Z0001 Encounter for general adult medical examination with abnormal findings: Secondary | ICD-10-CM

## 2016-10-28 MED ORDER — GLUCOSE BLOOD VI STRP: ORAL_STRIP | 12 refills | Status: DC

## 2016-10-28 MED ORDER — LANCETS MISC: 11 refills | Status: DC

## 2016-10-28 MED ORDER — CLINDAMYCIN PHOSPHATE 1 % EX SOLN: Freq: Two times a day (BID) | CUTANEOUS | 2 refills | Status: DC

## 2016-10-28 MED ORDER — PIOGLITAZONE HCL 45 MG PO TABS: 45.0000 mg | ORAL_TABLET | Freq: Every day | ORAL | 3 refills | Status: DC

## 2016-10-28 NOTE — Assessment & Plan Note (Signed)
Much improved overall, ok to increase the actos to 45 mg, cont all other tx, DM diet, rx for supplies sent to pharmacy, to restart check cbg at least once per day, f/u in 6 mo

## 2016-10-28 NOTE — Progress Notes (Signed)
Subjective:    Patient ID: [name], male    DOB: 12/24/1955, 59 y.o.   MRN: [id number]  HPI    Here to f/u; overall doing ok,  Pt denies chest pain, increasing sob or doe, wheezing, orthopnea, PND, increased LE swelling, palpitations, dizziness or syncope.  Pt denies new neurological symptoms such as new headache, or facial or extremity weakness or numbness.  Pt denies polydipsia, polyuria, or low sugar episode.   Pt denies new neurological symptoms such as new headache, or facial or extremity weakness or numbness.   Pt states overall good compliance with meds, mostly trying to follow appropriate diet, with wt overall stable,  but little exercise however. Admits to not checking sugars due to cost of supplies, not sure if covered by insurance. Declines flu shot today Wt Readings from Last 3 Encounters:  10/28/16 264 lb (119.7 kg)  07/14/16 264 lb (119.7 kg)  06/06/16 260 lb (117.9 kg)   Past Medical History:  Diagnosis Date  . ABNORMAL CHEST XRAY 2009  . Biceps tendon rupture   . DIABETES MELLITUS, TYPE II   . ERECTILE DYSFUNCTION, MILD   . Hyperlipidemia 07/14/2016  . HYPERTENSION   . Hypogonadism male 03/08/2014  . Hypogonadism, male   . Overweight(278.02)   . PSEUDOFOLLICULITIS BARBAE   . PULMONARY NODULE, RIGHT UPPER LOBE 02/10/2009   Past Surgical History:  Procedure Laterality Date  . NASAL POLYP SURGERY      reports that he quit smoking about 25 years ago. He has never used smokeless tobacco. He reports that he does not drink alcohol or use drugs. family history includes Cancer in his brother; Diabetes in his mother; Hypertension in his mother; Kidney failure in his father. No Known Allergies Current Outpatient Prescriptions on File Prior to Visit  Medication Sig Dispense Refill  . aspirin EC 81 MG tablet Take 1 tablet (81 mg total) by mouth daily. 90 tablet 11  . glimepiride (AMARYL) 4 MG tablet 1 tab by mouth twice per day 180 tablet 3  . lisinopril  (PRINIVIL,ZESTRIL) 20 MG tablet Take 1 tablet (20 mg total) by mouth daily. 90 tablet 3  . metFORMIN (GLUCOPHAGE) 500 MG tablet Take 1 tablet (500 mg total) by mouth 4 (four) times daily. 360 tablet 3  . pioglitazone (ACTOS) 30 MG tablet Take 1 tablet (30 mg total) by mouth daily. 90 tablet 3   No current facility-administered medications on file prior to visit.    Review of Systems  Constitutional: Negative for unusual diaphoresis or night sweats HENT: Negative for ear swelling or discharge Eyes: Negative for worsening visual haziness  Respiratory: Negative for choking and stridor.   Gastrointestinal: Negative for distension or worsening eructation Genitourinary: Negative for retention or change in urine volume.  Musculoskeletal: Negative for other MSK pain or swelling Skin: Negative for color change and worsening wound Neurological: Negative for tremors and numbness other than noted  Psychiatric/Behavioral: Negative for decreased concentration or agitation other than above   All other system neg per pt    Objective:   Physical Exam BP 140/80   Pulse 76   Temp 98.8 F (37.1 C) (Oral)   Resp 20   Wt 264 lb (119.7 kg)   SpO2 97%   BMI 33.90 kg/m  VS noted, not ill appaering Constitutional: Pt appears in no apparent distress HENT: Head: NCAT.  Right Ear: External ear normal.  Left Ear: External ear normal.  Eyes: . Pupils are equal, round, and reactive to light.  Conjunctivae and EOM are normal Neck: Normal range of motion. Neck supple.  Cardiovascular: Normal rate and regular rhythm.   Pulmonary/Chest: Effort normal and breath sounds without rales or wheezing.  Neurological: Pt is alert. Not confused , motor grossly intact Skin: Skin is warm. No rash, no LE edema Psychiatric: Pt behavior is normal. No agitation.   Hgba1c  (POCT) today - 7.7    Assessment & Plan:

## 2016-10-28 NOTE — Progress Notes (Signed)
Pre visit review using our clinic review tool, if applicable. No additional management support is needed unless otherwise documented below in the visit note. 

## 2016-10-28 NOTE — Assessment & Plan Note (Signed)
stable overall by history and exam, recent data reviewed with pt, and pt to continue medical treatment as before,  to f/u any worsening symptoms or concerns Lab Results  Component Value Date   LDLCALC 99 07/06/2016   Goal < 70, declines statin for now

## 2016-10-28 NOTE — Assessment & Plan Note (Signed)
stable overall by history and exam, recent data reviewed with pt, and pt to continue medical treatment as before,  to f/u any worsening symptoms or concerns BP Readings from Last 3 Encounters:  10/28/16 140/80  07/14/16 132/78  06/06/16 140/66

## 2016-10-28 NOTE — Patient Instructions (Addendum)
Your Hgba1c was :  7.7  OK to increase the actos to 45 mg per day  Please check your pharmacy about the cost of the strips and lancets since these have been now prescribed and I hope are covered by your insurance  Please continue all other medications as before, and refills have been done if requested.  Please have the pharmacy call with any other refills you may need.  Please continue your efforts at being more active, low cholesterol diet, and weight control.  Please keep your appointments with your specialists as you may have planned  Please return in 6 months, or sooner if needed, with Lab testing done 3-5 days before

## 2016-11-01 ENCOUNTER — Telehealth: Payer: Self-pay

## 2016-11-01 NOTE — Telephone Encounter (Signed)
Pt requests FMLA to care for his spouse (pt's of Dr Alain Marion).  Paperwork completed and placed on CMA's desk for MD signature

## 2016-11-03 DIAGNOSIS — Z7689 Persons encountering health services in other specified circumstances: Secondary | ICD-10-CM

## 2016-11-03 NOTE — Telephone Encounter (Signed)
Paperwork signed, faxed, copy placed in cabinet for pt pick up, copy sent to scan

## 2016-11-20 ENCOUNTER — Ambulatory Visit (INDEPENDENT_AMBULATORY_CARE_PROVIDER_SITE_OTHER): Payer: Federal, State, Local not specified - PPO | Admitting: Family Medicine

## 2016-11-20 ENCOUNTER — Ambulatory Visit (INDEPENDENT_AMBULATORY_CARE_PROVIDER_SITE_OTHER): Payer: Federal, State, Local not specified - PPO

## 2016-11-20 VITALS — BP 140/90 | HR 85 | Temp 98.2°F | Resp 17 | Ht 74.0 in | Wt 263.0 lb

## 2016-11-20 DIAGNOSIS — Z87440 Personal history of urinary (tract) infections: Secondary | ICD-10-CM | POA: Diagnosis not present

## 2016-11-20 DIAGNOSIS — M47816 Spondylosis without myelopathy or radiculopathy, lumbar region: Secondary | ICD-10-CM | POA: Diagnosis not present

## 2016-11-20 DIAGNOSIS — M5442 Lumbago with sciatica, left side: Secondary | ICD-10-CM

## 2016-11-20 DIAGNOSIS — M5136 Other intervertebral disc degeneration, lumbar region: Secondary | ICD-10-CM | POA: Diagnosis not present

## 2016-11-20 DIAGNOSIS — E119 Type 2 diabetes mellitus without complications: Secondary | ICD-10-CM | POA: Diagnosis not present

## 2016-11-20 DIAGNOSIS — M545 Low back pain: Secondary | ICD-10-CM | POA: Diagnosis not present

## 2016-11-20 LAB — POCT URINALYSIS DIP (MANUAL ENTRY)
Bilirubin, UA: NEGATIVE
Blood, UA: NEGATIVE
Glucose, UA: 500 — AB
Ketones, POC UA: NEGATIVE
Leukocytes, UA: NEGATIVE
Nitrite, UA: NEGATIVE
Protein Ur, POC: NEGATIVE
Spec Grav, UA: 1.02
Urobilinogen, UA: 0.2
pH, UA: 5.5

## 2016-11-20 MED ORDER — MELOXICAM 7.5 MG PO TABS: 7.5000 mg | ORAL_TABLET | Freq: Every day | ORAL | 0 refills | Status: DC

## 2016-11-20 MED ORDER — METHOCARBAMOL 500 MG PO TABS: 500.0000 mg | ORAL_TABLET | Freq: Three times a day (TID) | ORAL | 0 refills | Status: DC | PRN

## 2016-11-20 NOTE — Patient Instructions (Addendum)
   IF you received an x-ray today, you will receive an invoice from [facility]. Please contact [facility] at [phone] with questions or concerns regarding your invoice.   IF you received labwork today, you will receive an invoice from [facility]. Please contact [facility] at [phone] with questions or concerns regarding your invoice.   Our billing staff will not be able to assist you with questions regarding bills from these companies.  You will be contacted with the lab results as soon as they are available. The fastest way to get your results is to activate your My Chart account. Instructions are located on the last page of this paperwork. If you have not heard from us regarding the results in 2 weeks, please contact this office.     Low Back Sprain Rehab Ask your health care provider which exercises are safe for you. Do exercises exactly as told by your health care provider and adjust them as directed. It is normal to feel mild stretching, pulling, tightness, or discomfort as you do these exercises, but you should stop right away if you feel sudden pain or your pain gets worse. Do not begin these exercises until told by your health care provider. Stretching and range of motion exercises These exercises warm up your muscles and joints and improve the movement and flexibility of your back. These exercises also help to relieve pain, numbness, and tingling. Exercise A: Lumbar rotation   1. Lie on your back on a firm surface and bend your knees. 2. Straighten your arms out to your sides so each arm forms an "L" shape with a side of your body (a 90 degree angle). 3. Slowly move both of your knees to one side of your body until you feel a stretch in your lower back. Try not to let your shoulders move off of the floor. 4. Hold for __________ seconds. 5. Tense your abdominal muscles and slowly move your knees back to the starting position. 6. Repeat this exercise on the  other side of your body. Repeat __________ times. Complete this exercise __________ times a day. Exercise B: Prone extension on elbows   1. Lie on your abdomen on a firm surface. 2. Prop yourself up on your elbows. 3. Use your arms to help lift your chest up until you feel a gentle stretch in your abdomen and your lower back.  This will place some of your body weight on your elbows. If this is uncomfortable, try stacking pillows under your chest.  Your hips should stay down, against the surface that you are lying on. Keep your hip and back muscles relaxed. 4. Hold for __________ seconds. 5. Slowly relax your upper body and return to the starting position. Repeat __________ times. Complete this exercise __________ times a day. Strengthening exercises These exercises build strength and endurance in your back. Endurance is the ability to use your muscles for a long time, even after they get tired. Exercise C: Pelvic tilt  1. Lie on your back on a firm surface. Bend your knees and keep your feet flat. 2. Tense your abdominal muscles. Tip your pelvis up toward the ceiling and flatten your lower back into the floor.  To help with this exercise, you may place a small towel under your lower back and try to push your back into the towel. 3. Hold for __________ seconds. 4. Let your muscles relax completely before you repeat this exercise. Repeat __________ times. Complete this exercise __________ times a day. Exercise   D: Alternating arm and leg raises   1. Get on your hands and knees on a firm surface. If you are on a hard floor, you may want to use padding to cushion your knees, such as an exercise mat. 2. Line up your arms and legs. Your hands should be below your shoulders, and your knees should be below your hips. 3. Lift your left leg behind you. At the same time, raise your right arm and straighten it in front of you.  Do not lift your leg higher than your hip.  Do not lift your arm  higher than your shoulder.  Keep your abdominal and back muscles tight.  Keep your hips facing the ground.  Do not arch your back.  Keep your balance carefully, and do not hold your breath. 4. Hold for __________ seconds. 5. Slowly return to the starting position and repeat with your right leg and your left arm. Repeat __________ times. Complete this exercise __________ times a day. Exercise E: Abdominal set with straight leg raise   1. Lie on your back on a firm surface. 2. Bend one of your knees and keep your other leg straight. 3. Tense your abdominal muscles and lift your straight leg up, 4-6 inches (10-15 cm) off the ground. 4. Keep your abdominal muscles tight and hold for __________ seconds.  Do not hold your breath.  Do not arch your back. Keep it flat against the ground. 5. Keep your abdominal muscles tense as you slowly lower your leg back to the starting position. 6. Repeat with your other leg. Repeat __________ times. Complete this exercise __________ times a day. Posture and body mechanics   Body mechanics refers to the movements and positions of your body while you do your daily activities. Posture is part of body mechanics. Good posture and healthy body mechanics can help to relieve stress in your body's tissues and joints. Good posture means that your spine is in its natural S-curve position (your spine is neutral), your shoulders are pulled back slightly, and your head is not tipped forward. The following are general guidelines for applying improved posture and body mechanics to your everyday activities. Standing    When standing, keep your spine neutral and your feet about hip-width apart. Keep a slight bend in your knees. Your ears, shoulders, and hips should line up.  When you do a task in which you stand in one place for a long time, place one foot up on a stable object that is 2-4 inches (5-10 cm) high, such as a footstool. This helps keep your spine  neutral. Sitting    When sitting, keep your spine neutral and keep your feet flat on the floor. Use a footrest, if necessary, and keep your thighs parallel to the floor. Avoid rounding your shoulders, and avoid tilting your head forward.  When working at a desk or a computer, keep your desk at a height where your hands are slightly lower than your elbows. Slide your chair under your desk so you are close enough to maintain good posture.  When working at a computer, place your monitor at a height where you are looking straight ahead and you do not have to tilt your head forward or downward to look at the screen. Resting    When lying down and resting, avoid positions that are most painful for you.  If you have pain with activities such as sitting, bending, stooping, or squatting (flexion-based activities), lie in a position in which   your body does not bend very much. For example, avoid curling up on your side with your arms and knees near your chest (fetal position).  If you have pain with activities such as standing for a long time or reaching with your arms (extension-based activities), lie with your spine in a neutral position and bend your knees slightly. Try the following positions:  Lying on your side with a pillow between your knees.  Lying on your back with a pillow under your knees. Lifting    When lifting objects, keep your feet at least shoulder-width apart and tighten your abdominal muscles.  Bend your knees and hips and keep your spine neutral. It is important to lift using the strength of your legs, not your back. Do not lock your knees straight out.  Always ask for help to lift heavy or awkward objects. This information is not intended to replace advice given to you by your health care provider. Make sure you discuss any questions you have with your health care provider. Document Released: 11/15/2005 Document Revised: 07/22/2016 Document Reviewed: 08/27/2015 Elsevier  Interactive Patient Education  2017 Elsevier Inc.  

## 2016-11-20 NOTE — Progress Notes (Signed)
Subjective:    Patient ID: [name], male    DOB: Apr 02, 1956, 60 y.o.   MRN: [id number]  11/20/2016  Back Pain (Pain and numbness radiates down left leg. Since Sunday. NKI. )   HPI This 60 y.o. male presents for evaluation of L leg pain with pain and numbness.  Onsets one week ago; no injury.  No b/b dysfunction; no saddle paresthesias.  No weakness in L leg.  Works for [employer] in [place]; travels to [place].   Review of Systems  Constitutional: Negative for activity change, appetite change, chills, diaphoresis, fatigue and fever.  Eyes: Negative for visual disturbance.  Respiratory: Negative for cough and shortness of breath.   Cardiovascular: Negative for chest pain, palpitations and leg swelling.  Endocrine: Negative for cold intolerance, heat intolerance, polydipsia, polyphagia and polyuria.  Genitourinary: Negative for decreased urine volume and difficulty urinating.  Musculoskeletal: Positive for back pain, gait problem and myalgias.  Neurological: Positive for numbness. Negative for dizziness, tremors, seizures, syncope, facial asymmetry, speech difficulty, weakness, light-headedness and headaches.    Past Medical History:  Diagnosis Date  . ABNORMAL CHEST XRAY 2009  . Biceps tendon rupture   . DIABETES MELLITUS, TYPE II   . ERECTILE DYSFUNCTION, MILD   . Hyperlipidemia 07/14/2016  . HYPERTENSION   . Hypogonadism male 03/08/2014  . Hypogonadism, male   . Overweight(278.02)   . PSEUDOFOLLICULITIS BARBAE   . PULMONARY NODULE, RIGHT UPPER LOBE 02/10/2009   Past Surgical History:  Procedure Laterality Date  . NASAL POLYP SURGERY     No Known Allergies  Social History   Social History  . Marital status: Married    Spouse name: N/A  . Number of children: 2  . Years of education: N/A   Occupational History  . WORKS [employer]   Social History Main Topics  . Smoking status: Former Smoker    Quit date: 10/20/1991  . Smokeless  tobacco: Never Used  . Alcohol use No  . Drug use: No  . Sexual activity: Yes    Partners: Female   Other Topics Concern  . 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 2 daughters- '80, '84- 2 grandchildren-'99,  '06      Regular exercise: walk 3-4 x a week; lifting light weights   Caffeine use: none   Family History  Problem Relation Age of Onset  . Hypertension Mother   . Diabetes Mother   . Cancer Brother     Pancreatic  . Kidney failure Father   . Coronary artery disease Neg Hx        Objective:    BP 140/90 (BP Location: Left Arm, Patient Position: Sitting, Cuff Size: Large)   Pulse 85   Temp 98.2 F (36.8 C) (Oral)   Resp 17   Ht 6\' 2"  (1.88 m)   Wt 263 lb (119.3 kg)   SpO2 98%   BMI 33.77 kg/m  Physical Exam  Constitutional: He is oriented to person, place, and time. He appears well-developed and well-nourished. No distress.  HENT:  Head: Normocephalic and atraumatic.  Eyes: Conjunctivae and EOM are normal. Pupils are equal, round, and reactive to light.  Neck: Normal range of motion. Neck supple. Carotid bruit is not present. No thyromegaly present.  Cardiovascular: Normal rate, regular rhythm, normal heart sounds and intact distal pulses.  Exam  reveals no gallop and no friction rub.   No murmur heard. Pulmonary/Chest: Effort normal and breath sounds normal. He has no wheezes. He has no rales.  Musculoskeletal:       Left hip: Normal. He exhibits normal range of motion, normal strength, no tenderness and no bony tenderness.       Lumbar back: He exhibits decreased range of motion and pain. He exhibits no tenderness, no bony tenderness, no spasm and normal pulse.  Lymphadenopathy:    He has no cervical adenopathy.  Neurological: He is alert and oriented to person, place, and time. No cranial nerve deficit.  Skin: Skin is warm and dry. No rash noted. He  is not diaphoretic.  Psychiatric: He has a normal mood and affect. His behavior is normal.  Nursing note and vitals reviewed.  Results for orders placed or performed in visit on 11/20/16  POCT urinalysis dipstick  Result Value Ref Range   Color, UA yellow yellow   Clarity, UA clear clear   Glucose, UA =500 (A) negative   Bilirubin, UA negative negative   Ketones, POC UA negative negative   Spec Grav, UA 1.020    Blood, UA negative negative   pH, UA 5.5    Protein Ur, POC negative negative   Urobilinogen, UA 0.2    Nitrite, UA Negative Negative   Leukocytes, UA Negative Negative   Dg Lumbar Spine Complete  Result Date: 11/20/2016 CLINICAL DATA:  Woke up with back pain and left hip pain. No injury. EXAM: LUMBAR SPINE - COMPLETE 4+ VIEW COMPARISON:  None. FINDINGS: There are 6 non rib-bearing lumbar for rib. Vertebral body alignment and heights are normal. There is mild spondylosis throughout the lumbar spine to include moderate facet arthropathy of the mid to lower lumbar spine. There is disc space narrowing at the L4-5, L5-6 and L6-S1 levels. No compression fracture or spondylolisthesis. No definite spondylolysis. IMPRESSION: No acute findings. Mild spondylosis of the lumbar spine. Mild multilevel disc disease as described. Electronically Signed   By: Marin Olp M.D.   On: 11/20/2016 10:52   Dg Hip Unilat W Or W/o Pelvis 2-3 Views Left  Result Date: 11/20/2016 CLINICAL DATA:  Acute left-sided low back pain EXAM: DG HIP (WITH OR WITHOUT PELVIS) 2-3V LEFT COMPARISON:  None. FINDINGS: No fracture or dislocation is seen. Bilateral hip joint spaces are preserved. Visualized bony pelvis appears intact. Mild degenerative changes of the lower lumbar spine. IMPRESSION: Negative. Electronically Signed   By: Julian Hy M.D.   On: 11/20/2016 10:51       Assessment & Plan:   1. Acute left-sided low back pain with left-sided sciatica   2. Type 2 diabetes mellitus without complication,  without long-term current use of insulin ([place])   3. History of UTI    -New; consistent with lower back pain with radicular symptoms. -rx for Robaxin and Mobic provided. -home exercise program provided to perform daily. -recommend heat to area twice daily for 15-20 minutes. -if no improvement in 1-2 weeks, call for ortho referral.   Orders Placed This Encounter  Procedures  . Urine culture  . DG Lumbar Spine Complete    Standing Status:   Future    Number of Occurrences:   1    Standing Expiration Date:   11/20/2017    Order Specific Question:   Reason for Exam (SYMPTOM  OR DIAGNOSIS REQUIRED)    Answer:   L hip pain and L lower back pain; no injury; associated numbness and  tingling; stiffness upon standing    Order Specific Question:   Preferred imaging location?    Answer:   External  . DG HIP UNILAT W OR W/O PELVIS 2-3 VIEWS LEFT    Standing Status:   Future    Number of Occurrences:   1    Standing Expiration Date:   11/20/2017    Order Specific Question:   Reason for Exam (SYMPTOM  OR DIAGNOSIS REQUIRED)    Answer:   L hip pain and L lower back pain; no injury; associated numbness and tingling; stiffness upon standing    Order Specific Question:   Preferred imaging location?    Answer:   External  . POCT urinalysis dipstick   Meds ordered this encounter  Medications  . meloxicam (MOBIC) 7.5 MG tablet    Sig: Take 1 tablet (7.5 mg total) by mouth daily.    Dispense:  30 tablet    Refill:  0  . methocarbamol (ROBAXIN) 500 MG tablet    Sig: Take 1-2 tablets (500-1,000 mg total) by mouth every 8 (eight) hours as needed for muscle spasms.    Dispense:  45 tablet    Refill:  0    No Follow-up on file.   Kaeo Jacome Elayne Guerin, M.D. [facility] [address] [place], [place]  [address] [phone] phone [phone] fax

## 2016-11-22 LAB — URINE CULTURE

## 2016-11-25 ENCOUNTER — Telehealth: Payer: Self-pay | Admitting: *Deleted

## 2016-11-25 MED ORDER — IRBESARTAN 150 MG PO TABS: 150.0000 mg | ORAL_TABLET | Freq: Every day | ORAL | 3 refills | Status: DC

## 2016-11-25 NOTE — Telephone Encounter (Signed)
Left msg on triage stating the Lisinopril he's taking for BP is giving him a dry hacking cough. Requesting MD to rx something else...Johny Chess

## 2016-11-25 NOTE — Telephone Encounter (Signed)
[place] for change ACE to avapro generic 150 qd  - done erx

## 2016-11-25 NOTE — Telephone Encounter (Signed)
Notified pt of med change rx already sent to [facility]...Johny Chess

## 2016-12-13 DIAGNOSIS — E119 Type 2 diabetes mellitus without complications: Secondary | ICD-10-CM | POA: Diagnosis not present

## 2017-01-19 ENCOUNTER — Encounter (HOSPITAL_BASED_OUTPATIENT_CLINIC_OR_DEPARTMENT_OTHER): Payer: Self-pay | Admitting: *Deleted

## 2017-01-19 ENCOUNTER — Emergency Department (HOSPITAL_BASED_OUTPATIENT_CLINIC_OR_DEPARTMENT_OTHER)
Admission: EM | Admit: 2017-01-19 | Discharge: 2017-01-19 | Disposition: A | Discharge status: Home / Self Care | Payer: Federal, State, Local not specified - PPO | Attending: Emergency Medicine | Admitting: Emergency Medicine

## 2017-01-19 DIAGNOSIS — Z7984 Long term (current) use of oral hypoglycemic drugs: Secondary | ICD-10-CM | POA: Diagnosis not present

## 2017-01-19 DIAGNOSIS — I10 Essential (primary) hypertension: Secondary | ICD-10-CM | POA: Insufficient documentation

## 2017-01-19 DIAGNOSIS — Z79899 Other long term (current) drug therapy: Secondary | ICD-10-CM | POA: Diagnosis not present

## 2017-01-19 DIAGNOSIS — Z87891 Personal history of nicotine dependence: Secondary | ICD-10-CM | POA: Diagnosis not present

## 2017-01-19 DIAGNOSIS — L0291 Cutaneous abscess, unspecified: Secondary | ICD-10-CM

## 2017-01-19 DIAGNOSIS — L02811 Cutaneous abscess of head [any part, except face]: Secondary | ICD-10-CM | POA: Diagnosis not present

## 2017-01-19 DIAGNOSIS — Z7982 Long term (current) use of aspirin: Secondary | ICD-10-CM | POA: Insufficient documentation

## 2017-01-19 DIAGNOSIS — E119 Type 2 diabetes mellitus without complications: Secondary | ICD-10-CM | POA: Insufficient documentation

## 2017-01-19 MED ORDER — SULFAMETHOXAZOLE-TRIMETHOPRIM 800-160 MG PO TABS: 1.0000 | ORAL_TABLET | Freq: Two times a day (BID) | ORAL | 0 refills | Status: DC

## 2017-01-19 MED ORDER — SULFAMETHOXAZOLE-TRIMETHOPRIM 800-160 MG PO TABS
1.0000 | ORAL_TABLET | Freq: Once | ORAL | Status: AC
  Administered 2017-01-19: 1 via ORAL
  Filled 2017-01-19: qty 1

## 2017-01-19 NOTE — ED Provider Notes (Signed)
TIME SEEN: 4:10 AM  CHIEF COMPLAINT: Abscess  HPI: Patient is a 61 year old male with history of hypertension, diabetes, hyperlipidemia, pseudofolliculitis barbae who presents to the emergency department with an abscess to the back of his head located on his posterior scalp. Reports he has been using warm compresses without any relief. No drainage. No fevers, chills, nausea, vomiting or diarrhea. Denies any pain currently.  ROS: See HPI Constitutional: no fever  Eyes: no drainage  ENT: no runny nose   Cardiovascular:  no chest pain  Resp: no SOB  GI: no vomiting GU: no dysuria Integumentary: no rash  Allergy: no hives  Musculoskeletal: no leg swelling  Neurological: no slurred speech ROS otherwise negative  PAST MEDICAL HISTORY/PAST SURGICAL HISTORY:  Past Medical History:  Diagnosis Date  . ABNORMAL CHEST XRAY 2009  . Biceps tendon rupture   . DIABETES MELLITUS, TYPE II   . ERECTILE DYSFUNCTION, MILD   . Hyperlipidemia 07/14/2016  . HYPERTENSION   . Hypogonadism male 03/08/2014  . Hypogonadism, male   . Overweight(278.02)   . PSEUDOFOLLICULITIS BARBAE   . PULMONARY NODULE, RIGHT UPPER LOBE 02/10/2009    MEDICATIONS:  Prior to Admission medications   Medication Sig Start Date End Date Taking? Authorizing Provider  glimepiride (AMARYL) 4 MG tablet Take 4 mg by mouth daily with breakfast.   Yes Historical Provider, MD  irbesartan (AVAPRO) 150 MG tablet Take 150 mg by mouth daily.   Yes Historical Provider, MD  aspirin EC 81 MG tablet Take 1 tablet (81 mg total) by mouth daily. 07/14/16   Biagio Borg, MD  metFORMIN (GLUCOPHAGE) 500 MG tablet Take 1 tablet (500 mg total) by mouth 4 (four) times daily. 07/14/16   Biagio Borg, MD  pioglitazone (ACTOS) 45 MG tablet Take 1 tablet (45 mg total) by mouth daily. 10/28/16   Biagio Borg, MD  sulfamethoxazole-trimethoprim (BACTRIM DS,SEPTRA DS) 800-160 MG tablet Take 1 tablet by mouth 2 (two) times daily. 01/19/17 01/26/17  Delice Bison Murel Wigle,  DO    ALLERGIES:  No Known Allergies  SOCIAL HISTORY:  Social History  Substance Use Topics  . Smoking status: Former Smoker    Quit date: 10/20/1991  . Smokeless tobacco: Never Used  . Alcohol use No    FAMILY HISTORY: Family History  Problem Relation Age of Onset  . Hypertension Mother   . Diabetes Mother   . Kidney failure Father   . Cancer Brother     Pancreatic  . Coronary artery disease Neg Hx     EXAM: BP 163/83 (BP Location: Left Arm)   Pulse 72   Temp 98.7 F (37.1 C) (Oral)   Resp 20   Ht 6\' 2"  (1.88 m)   Wt 260 lb (117.9 kg)   SpO2 98%   BMI 33.38 kg/m  CONSTITUTIONAL: Alert and oriented and responds appropriately to questions. Well-appearing; well-nourished HEAD: Normocephalic EYES: Conjunctivae clear, PERRL, EOMI ENT: normal nose; no rhinorrhea; moist mucous membranes NECK: Supple, no meningismus, no nuchal rigidity, no LAD  CARD: RRR; S1 and S2 appreciated; no murmurs, no clicks, no rubs, no gallops RESP: Normal chest excursion without splinting or tachypnea; breath sounds clear and equal bilaterally; no wheezes, no rhonchi, no rales, no hypoxia or respiratory distress, speaking full sentences ABD/GI: Normal bowel sounds; non-distended; soft, non-tender, no rebound, no guarding, no peritoneal signs, no hepatosplenomegaly BACK:  The back appears normal and is non-tender to palpation, there is no CVA tenderness EXT: Normal ROM in all joints; non-tender to  palpation; no edema; normal capillary refill; no cyanosis, no calf tenderness or swelling    SKIN: Normal color for age and race; warm; no rash; 1 x 2 cm slightly hard nodular like lesion to the posterior scalp with central punctate lesion without any drainage. There is no erythema, warmth or fluctuance. NEURO: Moves all extremities equally, sensation to light touch intact diffusely, cranial nerves II through XII intact, normal speech PSYCH: The patient's mood and manner are appropriate. Grooming and  personal hygiene are appropriate.  MEDICAL DECISION MAKING: Patient here with what appears to be folliculitis, small abscess to the posterior scalp. No surrounding cellulitis. This area is very small and has no fluctuance and may not be amendable to drainage but have offered him incision and drainage today but he declines this stating he would like to try antibiotics first. We'll discharge with Bactrim. We'll have him follow-up with his PCP if this is not improving. He has no systemic symptoms. No other sign of rash, infection on exam. Discussed return cautions. He is comfortable with this plan.  At this time, I do not feel there is any life-threatening condition present. I have reviewed and discussed all results (EKG, imaging, lab, urine as appropriate) and exam findings with patient/family. I have reviewed nursing notes and appropriate previous records.  I feel the patient is safe to be discharged home without further emergent workup and can continue workup as an outpatient as needed. Discussed usual and customary return precautions. Patient/family verbalize understanding and are comfortable with this plan.  Outpatient follow-up has been provided. All questions have been answered.      [place], DO 01/19/17 [phone]

## 2017-01-19 NOTE — ED Triage Notes (Signed)
Pt presents with abcess to the back of head x 3 days denies N/V or fevers

## 2017-01-25 ENCOUNTER — Ambulatory Visit (HOSPITAL_COMMUNITY)
Admission: EM | Admit: 2017-01-25 | Discharge: 2017-01-25 | Disposition: A | Discharge status: Home / Self Care | Payer: Federal, State, Local not specified - PPO | Attending: Family Medicine | Admitting: Family Medicine

## 2017-01-25 ENCOUNTER — Encounter (HOSPITAL_COMMUNITY): Payer: Self-pay | Admitting: Emergency Medicine

## 2017-01-25 DIAGNOSIS — L0291 Cutaneous abscess, unspecified: Secondary | ICD-10-CM

## 2017-01-25 MED ORDER — SULFAMETHOXAZOLE-TRIMETHOPRIM 800-160 MG PO TABS: 1.0000 | ORAL_TABLET | Freq: Two times a day (BID) | ORAL | 0 refills | Status: AC

## 2017-01-25 MED ORDER — HYDROCODONE-ACETAMINOPHEN 5-325 MG PO TABS: 2.0000 | ORAL_TABLET | ORAL | 0 refills | Status: DC | PRN

## 2017-01-25 NOTE — ED Provider Notes (Signed)
CSN: [id number]     Arrival date & time 01/25/17  1936 History   None    Chief Complaint  Patient presents with  . Abscess   (Consider location/radiation/quality/duration/timing/severity/associated sxs/prior Treatment) Patient c/o abscess on back of scalp.   The history is provided by the patient.  Abscess  Location:  Head/neck Head/neck abscess location:  Scalp Size:  4 cm Abscess quality: draining, painful and redness   Red streaking: no   Duration:  1 week Progression:  Worsening Pain details:    Quality:  Dull   Severity:  Mild   Duration:  1 week   Timing:  Constant   Progression:  Worsening Chronicity:  New Relieved by:  Nothing Worsened by:  Nothing Ineffective treatments:  None tried   Past Medical History:  Diagnosis Date  . ABNORMAL CHEST XRAY 2009  . Biceps tendon rupture   . DIABETES MELLITUS, TYPE II   . ERECTILE DYSFUNCTION, MILD   . Hyperlipidemia 07/14/2016  . HYPERTENSION   . Hypogonadism male 03/08/2014  . Hypogonadism, male   . Overweight(278.02)   . PSEUDOFOLLICULITIS BARBAE   . PULMONARY NODULE, RIGHT UPPER LOBE 02/10/2009   Past Surgical History:  Procedure Laterality Date  . NASAL POLYP SURGERY     Family History  Problem Relation Age of Onset  . Hypertension Mother   . Diabetes Mother   . Kidney failure Father   . Cancer Brother     Pancreatic  . Coronary artery disease Neg Hx    Social History  Substance Use Topics  . Smoking status: Former Smoker    Quit date: 10/20/1991  . Smokeless tobacco: Never Used  . Alcohol use No    Review of Systems  Constitutional: Negative.   HENT: Negative.   Eyes: Negative.   Respiratory: Negative.   Cardiovascular: Negative.   Gastrointestinal: Negative.   Endocrine: Negative.   Genitourinary: Negative.   Musculoskeletal: Negative.   Skin: Positive for wound.  Allergic/Immunologic: Negative.   Neurological: Negative.   Hematological: Negative.   Psychiatric/Behavioral: Negative.      Allergies  Patient has no known allergies.  Home Medications   Prior to Admission medications   Medication Sig Start Date End Date Taking? Authorizing Provider  aspirin EC 81 MG tablet Take 1 tablet (81 mg total) by mouth daily. 07/14/16  Yes Biagio Borg, MD  glimepiride (AMARYL) 4 MG tablet Take 4 mg by mouth daily with breakfast.   Yes Historical Provider, MD  irbesartan (AVAPRO) 150 MG tablet Take 150 mg by mouth daily.   Yes Historical Provider, MD  metFORMIN (GLUCOPHAGE) 500 MG tablet Take 1 tablet (500 mg total) by mouth 4 (four) times daily. 07/14/16  Yes Biagio Borg, MD  HYDROcodone-acetaminophen (NORCO/VICODIN) 5-325 MG tablet Take 2 tablets by mouth every 4 (four) hours as needed. 01/25/17   Lysbeth Penner, FNP  sulfamethoxazole-trimethoprim (BACTRIM DS,SEPTRA DS) 800-160 MG tablet Take 1 tablet by mouth 2 (two) times daily. 01/25/17 02/01/17  Lysbeth Penner, FNP   Meds Ordered and Administered this Visit  Medications - No data to display  BP 183/93 (BP Location: Left Arm)   Pulse 73   Temp 97.3 F (36.3 C) (Oral)   Resp 16   SpO2 100%  No data found.   Physical Exam  Constitutional: He appears well-developed and well-nourished.  HENT:  Head: Normocephalic and atraumatic.  Eyes: Conjunctivae and EOM are normal. Pupils are equal, round, and reactive to light.  Neck: Normal range of  motion. Neck supple.  Cardiovascular: Normal rate, regular rhythm and normal heart sounds.   Pulmonary/Chest: Effort normal and breath sounds normal.  Skin:  Abscess on occipital region with fluctuance approx 3 - 4 cm diameter.  Nursing note and vitals reviewed.   Urgent Care Course     .Marland KitchenIncision and Drainage Date/Time: 01/25/2017 8:59 PM Performed by: Lysbeth Penner Authorized by: Robyn Haber   Consent:    Consent obtained:  Verbal   Consent given by:  Patient   Risks discussed:  Bleeding, incomplete drainage, infection and pain   Alternatives discussed:  No  treatment Location:    Type:  Abscess   Size:  4 cm   Location:  Head   Head location:  Scalp Pre-procedure details:    Skin preparation:  Betadine Anesthesia (see MAR for exact dosages):    Anesthesia method:  Local infiltration   Local anesthetic:  Lidocaine 2% WITH epi Procedure type:    Complexity:  Simple Procedure details:    Needle aspiration: no     Incision types:  Stab incision   Scalpel blade:  11   Wound management:  Probed and deloculated   Drainage:  Purulent and serosanguinous   Drainage amount:  Moderate   Wound treatment:  Wound left open   Packing materials:  None Post-procedure details:    Patient tolerance of procedure:  Tolerated well, no immediate complications   (including critical care time)  Labs Review Labs Reviewed - No data to display  Imaging Review No results found.   Visual Acuity Review  Right Eye Distance:   Left Eye Distance:   Bilateral Distance:    Right Eye Near:   Left Eye Near:    Bilateral Near:         MDM   1. Abscess    Incision and Drainage Bactrim DS one po bid x 7 days Norco 5/325 one po q 6 hours prn pain #6     Lysbeth Penner, FNP 01/25/17 2100

## 2017-01-25 NOTE — ED Triage Notes (Signed)
The patient presented to the [facility] with a complaint of an abscess on the back of his head x 1 week. The patient was evaluated at the ED and prescribed bactrim that he has been taking as directed.

## 2017-02-18 ENCOUNTER — Encounter (HOSPITAL_COMMUNITY): Payer: Self-pay | Admitting: Emergency Medicine

## 2017-02-18 ENCOUNTER — Ambulatory Visit (HOSPITAL_COMMUNITY)
Admission: EM | Admit: 2017-02-18 | Discharge: 2017-02-18 | Disposition: A | Discharge status: Home / Self Care | Payer: Federal, State, Local not specified - PPO | Attending: Internal Medicine | Admitting: Internal Medicine

## 2017-02-18 DIAGNOSIS — R040 Epistaxis: Secondary | ICD-10-CM | POA: Diagnosis not present

## 2017-02-18 MED ORDER — OXYMETAZOLINE HCL 0.05 % NA SOLN
1.0000 | Freq: Two times a day (BID) | NASAL | Status: DC
  Administered 2017-02-18: 1 via NASAL

## 2017-02-18 MED ORDER — OXYMETAZOLINE HCL 0.05 % NA SOLN
NASAL | Status: AC
  Filled 2017-02-18: qty 15

## 2017-02-18 NOTE — ED Triage Notes (Signed)
PT reports bleeding from left nare only after he blows his nose since Wednesday. PT reports bleeding is not heavy and lasts for about 5 minutes each time. PT reports no nasal pain. PT is not on blood thinners. PT would also like an abscess site on back of scalp observed. PT had this area drained last time he was here. No current nose bleed during triage

## 2017-02-18 NOTE — ED Provider Notes (Signed)
CSN: [id number]     Arrival date & time 02/18/17  1946 History   First MD Initiated Contact with Patient 02/18/17 2030     Chief Complaint  Patient presents with  . Epistaxis  . Abscess   (Consider location/radiation/quality/duration/timing/severity/associated sxs/prior Treatment) Patient c/o left nose bleed after he blows his nose.  His nose is not currently bleeding.   The history is provided by the patient.  Epistaxis  Location:  L nare Severity:  Moderate Duration:  1 day Timing:  Constant Progression:  Worsening Chronicity:  New Relieved by:  Nothing Worsened by:  Nothing Ineffective treatments:  None tried Abscess    Past Medical History:  Diagnosis Date  . ABNORMAL CHEST XRAY 2009  . Biceps tendon rupture   . DIABETES MELLITUS, TYPE II   . ERECTILE DYSFUNCTION, MILD   . Hyperlipidemia 07/14/2016  . HYPERTENSION   . Hypogonadism male 03/08/2014  . Hypogonadism, male   . Overweight(278.02)   . PSEUDOFOLLICULITIS BARBAE   . PULMONARY NODULE, RIGHT UPPER LOBE 02/10/2009   Past Surgical History:  Procedure Laterality Date  . NASAL POLYP SURGERY     Family History  Problem Relation Age of Onset  . Hypertension Mother   . Diabetes Mother   . Kidney failure Father   . Cancer Brother     Pancreatic  . Coronary artery disease Neg Hx    Social History  Substance Use Topics  . Smoking status: Former Smoker    Quit date: 10/20/1991  . Smokeless tobacco: Never Used  . Alcohol use No    Review of Systems  Constitutional: Negative.   HENT: Positive for nosebleeds.   Eyes: Negative.   Respiratory: Negative.   Cardiovascular: Negative.   Gastrointestinal: Negative.   Endocrine: Negative.   Genitourinary: Negative.   Musculoskeletal: Negative.   Allergic/Immunologic: Negative.   Neurological: Negative.   Hematological: Negative.   Psychiatric/Behavioral: Negative.     Allergies  Patient has no known allergies.  Home Medications   Prior to Admission  medications   Medication Sig Start Date End Date Taking? Authorizing Provider  aspirin EC 81 MG tablet Take 1 tablet (81 mg total) by mouth daily. 07/14/16   Biagio Borg, MD  glimepiride (AMARYL) 4 MG tablet Take 4 mg by mouth daily with breakfast.    Historical Provider, MD  HYDROcodone-acetaminophen (NORCO/VICODIN) 5-325 MG tablet Take 2 tablets by mouth every 4 (four) hours as needed. 01/25/17   Lysbeth Penner, FNP  irbesartan (AVAPRO) 150 MG tablet Take 150 mg by mouth daily.    Historical Provider, MD  metFORMIN (GLUCOPHAGE) 500 MG tablet Take 1 tablet (500 mg total) by mouth 4 (four) times daily. 07/14/16   Biagio Borg, MD   Meds Ordered and Administered this Visit   Medications  oxymetazoline (AFRIN) 0.05 % nasal spray 1 spray (1 spray Each Nare Given 02/18/17 2042)    BP (!) 157/95   Pulse 67   Temp 98 F (36.7 C) (Oral)   Resp 16   Ht 6\' 2"  (1.88 m)   Wt 265 lb (120.2 kg)   SpO2 99%   BMI 34.02 kg/m  No data found.   Physical Exam  Constitutional: He appears well-developed and well-nourished.  HENT:  Head: Normocephalic and atraumatic.  Right Ear: External ear normal.  Left Ear: External ear normal.  Mouth/Throat: Oropharynx is clear and moist.  Left nare with blood clots but not actively bleeding.  Eyes: Conjunctivae and EOM are normal. Pupils are  equal, round, and reactive to light.  Neck: Normal range of motion. Neck supple.  Cardiovascular: Normal rate, regular rhythm and normal heart sounds.   Pulmonary/Chest: Effort normal and breath sounds normal.  Abdominal: Soft. Bowel sounds are normal.  Musculoskeletal: Normal range of motion.  Nursing note and vitals reviewed.   Urgent Care Course     Procedures (including critical care time)  Labs Review Labs Reviewed - No data to display  Imaging Review No results found.   Visual Acuity Review  Right Eye Distance:   Left Eye Distance:   Bilateral Distance:    Right Eye Near:   Left Eye Near:     Bilateral Near:         MDM   1. Epistaxis    Afrin Nasal Spray 3 sprays per left nare with nose bleed.    Lysbeth Penner, FNP 02/18/17 2121    Lysbeth Penner, Turner 02/18/17 2123

## 2017-02-18 NOTE — Discharge Instructions (Signed)
If nose bleed returns take 3 sprays of Afrin Nasal Spray left nostril

## 2017-03-03 ENCOUNTER — Other Ambulatory Visit: Payer: Self-pay | Admitting: Internal Medicine

## 2017-04-02 ENCOUNTER — Other Ambulatory Visit: Payer: Self-pay | Admitting: Internal Medicine

## 2017-04-04 NOTE — Telephone Encounter (Signed)
Routing to dr john, please advise, thanks 

## 2017-04-22 ENCOUNTER — Ambulatory Visit: Payer: Federal, State, Local not specified - PPO | Admitting: Internal Medicine

## 2017-05-09 DIAGNOSIS — E119 Type 2 diabetes mellitus without complications: Secondary | ICD-10-CM | POA: Diagnosis not present

## 2017-05-09 DIAGNOSIS — E291 Testicular hypofunction: Secondary | ICD-10-CM | POA: Diagnosis not present

## 2017-05-09 DIAGNOSIS — N529 Male erectile dysfunction, unspecified: Secondary | ICD-10-CM | POA: Diagnosis not present

## 2017-05-09 DIAGNOSIS — I10 Essential (primary) hypertension: Secondary | ICD-10-CM | POA: Diagnosis not present

## 2017-05-12 ENCOUNTER — Other Ambulatory Visit: Payer: Self-pay | Admitting: Internal Medicine

## 2017-05-31 DIAGNOSIS — E291 Testicular hypofunction: Secondary | ICD-10-CM | POA: Diagnosis not present

## 2017-05-31 DIAGNOSIS — E119 Type 2 diabetes mellitus without complications: Secondary | ICD-10-CM | POA: Diagnosis not present

## 2017-05-31 DIAGNOSIS — R0789 Other chest pain: Secondary | ICD-10-CM | POA: Diagnosis not present

## 2017-05-31 DIAGNOSIS — I10 Essential (primary) hypertension: Secondary | ICD-10-CM | POA: Diagnosis not present

## 2017-05-31 DIAGNOSIS — Z7984 Long term (current) use of oral hypoglycemic drugs: Secondary | ICD-10-CM | POA: Diagnosis not present

## 2017-07-11 DIAGNOSIS — H40013 Open angle with borderline findings, low risk, bilateral: Secondary | ICD-10-CM | POA: Diagnosis not present

## 2017-07-12 ENCOUNTER — Encounter: Payer: Self-pay | Admitting: Internal Medicine

## 2017-07-12 ENCOUNTER — Ambulatory Visit: Payer: Federal, State, Local not specified - PPO | Admitting: Internal Medicine

## 2017-07-12 ENCOUNTER — Ambulatory Visit (INDEPENDENT_AMBULATORY_CARE_PROVIDER_SITE_OTHER): Payer: Federal, State, Local not specified - PPO | Admitting: Internal Medicine

## 2017-07-12 VITALS — BP 136/90 | HR 78 | Ht 74.0 in | Wt 266.0 lb

## 2017-07-12 DIAGNOSIS — I10 Essential (primary) hypertension: Secondary | ICD-10-CM

## 2017-07-12 DIAGNOSIS — E119 Type 2 diabetes mellitus without complications: Secondary | ICD-10-CM | POA: Diagnosis not present

## 2017-07-12 DIAGNOSIS — E291 Testicular hypofunction: Secondary | ICD-10-CM

## 2017-07-12 DIAGNOSIS — Z114 Encounter for screening for human immunodeficiency virus [HIV]: Secondary | ICD-10-CM | POA: Diagnosis not present

## 2017-07-12 DIAGNOSIS — Z0001 Encounter for general adult medical examination with abnormal findings: Secondary | ICD-10-CM

## 2017-07-12 DIAGNOSIS — E785 Hyperlipidemia, unspecified: Secondary | ICD-10-CM

## 2017-07-12 MED ORDER — CLINDAMYCIN PHOSPHATE 1 % EX SOLN: CUTANEOUS | 5 refills | Status: DC

## 2017-07-12 NOTE — Assessment & Plan Note (Signed)
Goal LDL < 70 -  Lab Results  Component Value Date   LDLCALC 99 07/06/2016  declines statin for now, wants to work on diet,  to f/u any worsening symptoms or concerns

## 2017-07-12 NOTE — Progress Notes (Signed)
Subjective:    Patient ID: [name], male    DOB: 1955/12/22, 61 y.o.   MRN: [id number]  HPI  Here to f/u; overall doing ok,  Pt denies chest pain, increasing sob or doe, wheezing, orthopnea, PND, increased LE swelling, palpitations, dizziness or syncope.  Pt denies new neurological symptoms such as new headache, or facial or extremity weakness or numbness.  Pt denies polydipsia, polyuria, or low sugar episode.  Pt states overall good compliance with meds, mostly trying to follow appropriate diet, with wt overall stable,  but little exercise however.  Asks for fu for hypogonadism with further refills as he would rather not go back to urology just for this.   Seen in ED earlier this yr with epistaxis and post neck abscess ,Just had optho exam yesterday.  No other new hx Past Medical History:  Diagnosis Date  . ABNORMAL CHEST XRAY 2009  . Biceps tendon rupture   . DIABETES MELLITUS, TYPE II   . ERECTILE DYSFUNCTION, MILD   . Hyperlipidemia 07/14/2016  . HYPERTENSION   . Hypogonadism male 03/08/2014  . Hypogonadism, male   . Overweight(278.02)   . PSEUDOFOLLICULITIS BARBAE   . PULMONARY NODULE, RIGHT UPPER LOBE 02/10/2009   Past Surgical History:  Procedure Laterality Date  . NASAL POLYP SURGERY      reports that he quit smoking about 25 years ago. He has never used smokeless tobacco. He reports that he does not drink alcohol or use drugs. family history includes Cancer in his brother; Diabetes in his mother; Hypertension in his mother; Kidney failure in his father. No Known Allergies Current Outpatient Prescriptions on File Prior to Visit  Medication Sig Dispense Refill  . aspirin EC 81 MG tablet Take 1 tablet (81 mg total) by mouth daily. 90 tablet 11  . glimepiride (AMARYL) 4 MG tablet Take 4 mg by mouth daily with breakfast.    . HYDROcodone-acetaminophen (NORCO/VICODIN) 5-325 MG tablet Take 2 tablets by mouth every 4 (four) hours as needed. 6 tablet 0  . irbesartan (AVAPRO)  150 MG tablet Take 150 mg by mouth daily.    . metFORMIN (GLUCOPHAGE) 500 MG tablet Take 1 tablet (500 mg total) by mouth 4 (four) times daily. 360 tablet 3   No current facility-administered medications on file prior to visit.    Review of Systems  Constitutional: Negative for other unusual diaphoresis or sweats HENT: Negative for ear discharge or swelling Eyes: Negative for other worsening visual disturbances Respiratory: Negative for stridor or other swelling  Gastrointestinal: Negative for worsening distension or other blood Genitourinary: Negative for retention or other urinary change Musculoskeletal: Negative for other MSK pain or swelling Skin: Negative for color change or other new lesions Neurological: Negative for worsening tremors and other numbness  Psychiatric/Behavioral: Negative for worsening agitation or other fatigue All other system neg per pt    Objective:   Physical Exam BP 136/90   Pulse 78   Ht 6\' 2"  (1.88 m)   Wt 266 lb (120.7 kg)   SpO2 99%   BMI 34.15 kg/m  VS noted,  Constitutional: Pt appears in NAD HENT: Head: NCAT.  Right Ear: External ear normal.  Left Ear: External ear normal.  Eyes: . Pupils are equal, round, and reactive to light. Conjunctivae and EOM are normal Nose: without d/c or deformity Neck: Neck supple. Gross normal ROM Cardiovascular: Normal rate and regular rhythm.   Pulmonary/Chest: Effort normal and breath sounds without rales or wheezing.  Abd:  Soft,  NT, ND, + BS, no organomegaly Neurological: Pt is alert. At baseline orientation, motor grossly intact Skin: Skin is warm. No rashes, other new lesions, no LE edema Psychiatric: Pt behavior is normal without agitation  No other exam findings Lab Results  Component Value Date   WBC 7.7 07/06/2016   HGB 15.0 07/06/2016   HCT 47.4 07/06/2016   PLT 141.0 (L) 07/06/2016   GLUCOSE 220 (H) 07/06/2016   CHOL 155 07/06/2016   TRIG 66.0 07/06/2016   HDL 42.90 07/06/2016   LDLCALC 99  07/06/2016   ALT 39 07/06/2016   AST 32 07/06/2016   NA 137 07/06/2016   K 4.7 07/06/2016   CL 101 07/06/2016   CREATININE 0.92 07/06/2016   BUN 14 07/06/2016   CO2 29 07/06/2016   TSH 2.27 07/06/2016   PSA 0.99 07/06/2016   HGBA1C 9.6 (H) 07/06/2016   MICROALBUR 2.7 (H) 07/06/2016       Assessment & Plan:

## 2017-07-12 NOTE — Assessment & Plan Note (Signed)
Improved but still mod poor overall control; pt reqeusts endo referral, will f/u lab next visit

## 2017-07-12 NOTE — Assessment & Plan Note (Signed)
BP Readings from Last 3 Encounters:  07/12/17 136/90  02/18/17 (!) 157/95  01/25/17 183/93  stable overall by history and exam, recent data reviewed with pt, and pt to continue medical treatment as before,  to f/u any worsening symptoms or concerns

## 2017-07-12 NOTE — Assessment & Plan Note (Signed)
Ok to cont testosterone replacement for now as per urology, and will f/u lab at next visit

## 2017-07-12 NOTE — Patient Instructions (Addendum)
Please continue all other medications as before, and refills have been done if requested - the facial cream  Please have the pharmacy call with any other refills you may need.  Please continue your efforts at being more active, low cholesterol diabeti diet, and weight control.  Please keep your appointments with your specialists as you may have planned  You will be contacted regarding the referral for: endocrinology  Please return in about 1 month, or sooner if needed, with Lab testing done 3-5  days before

## 2017-07-14 IMAGING — DX DG HIP (WITH OR WITHOUT PELVIS) 2-3V*L*
3 series · 3 of 3 positions shown · non-contrast
Comparison: None.

CLINICAL DATA: Acute left-sided low back pain

EXAM:
DG HIP (WITH OR WITHOUT PELVIS) 2-3V LEFT

[pelvis ap]
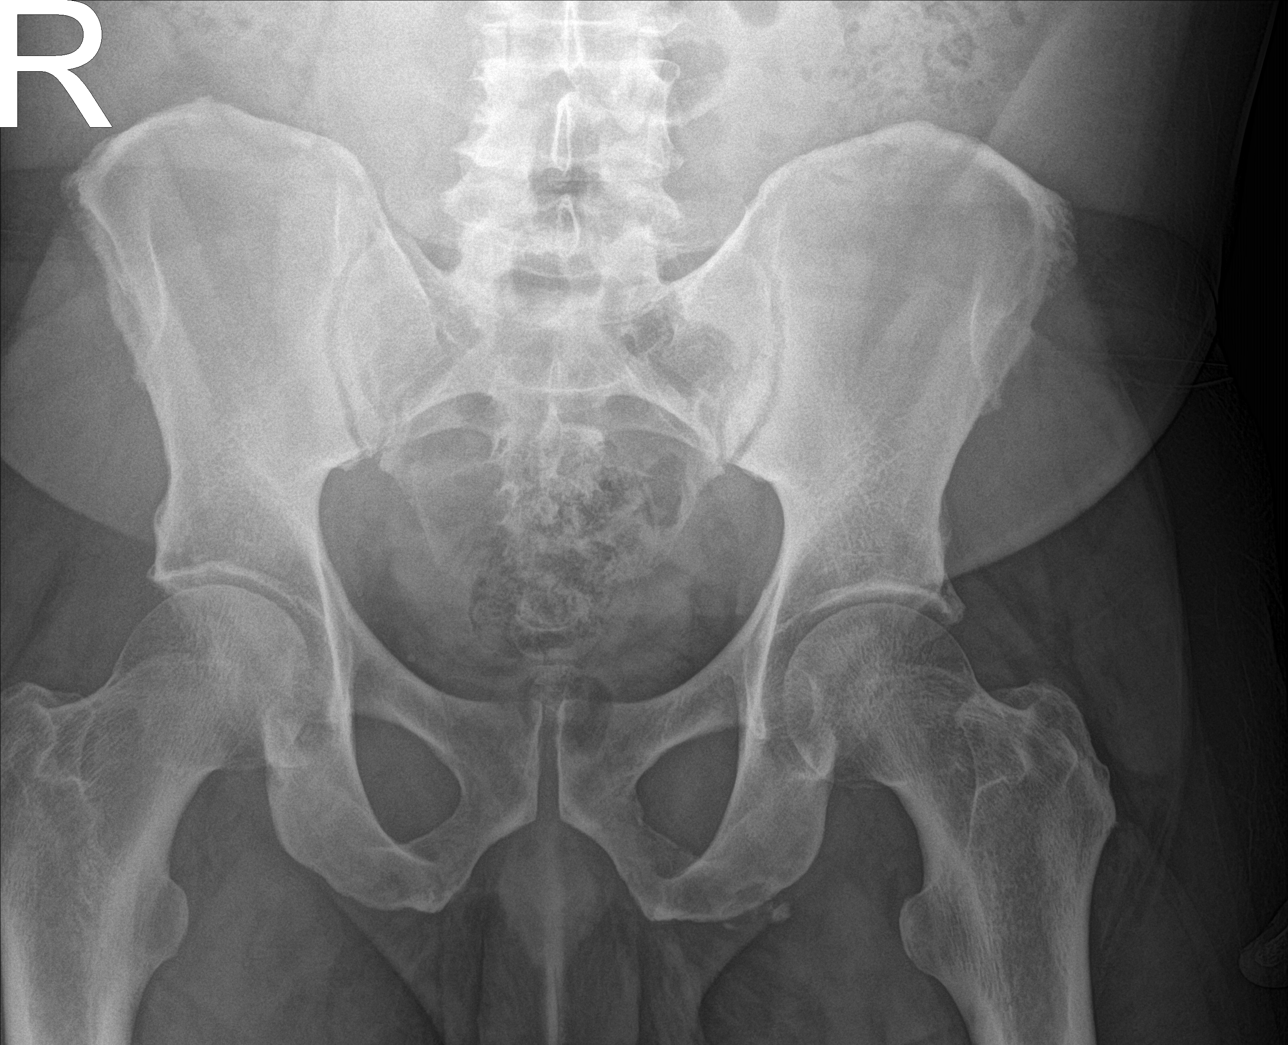

[hip ap]
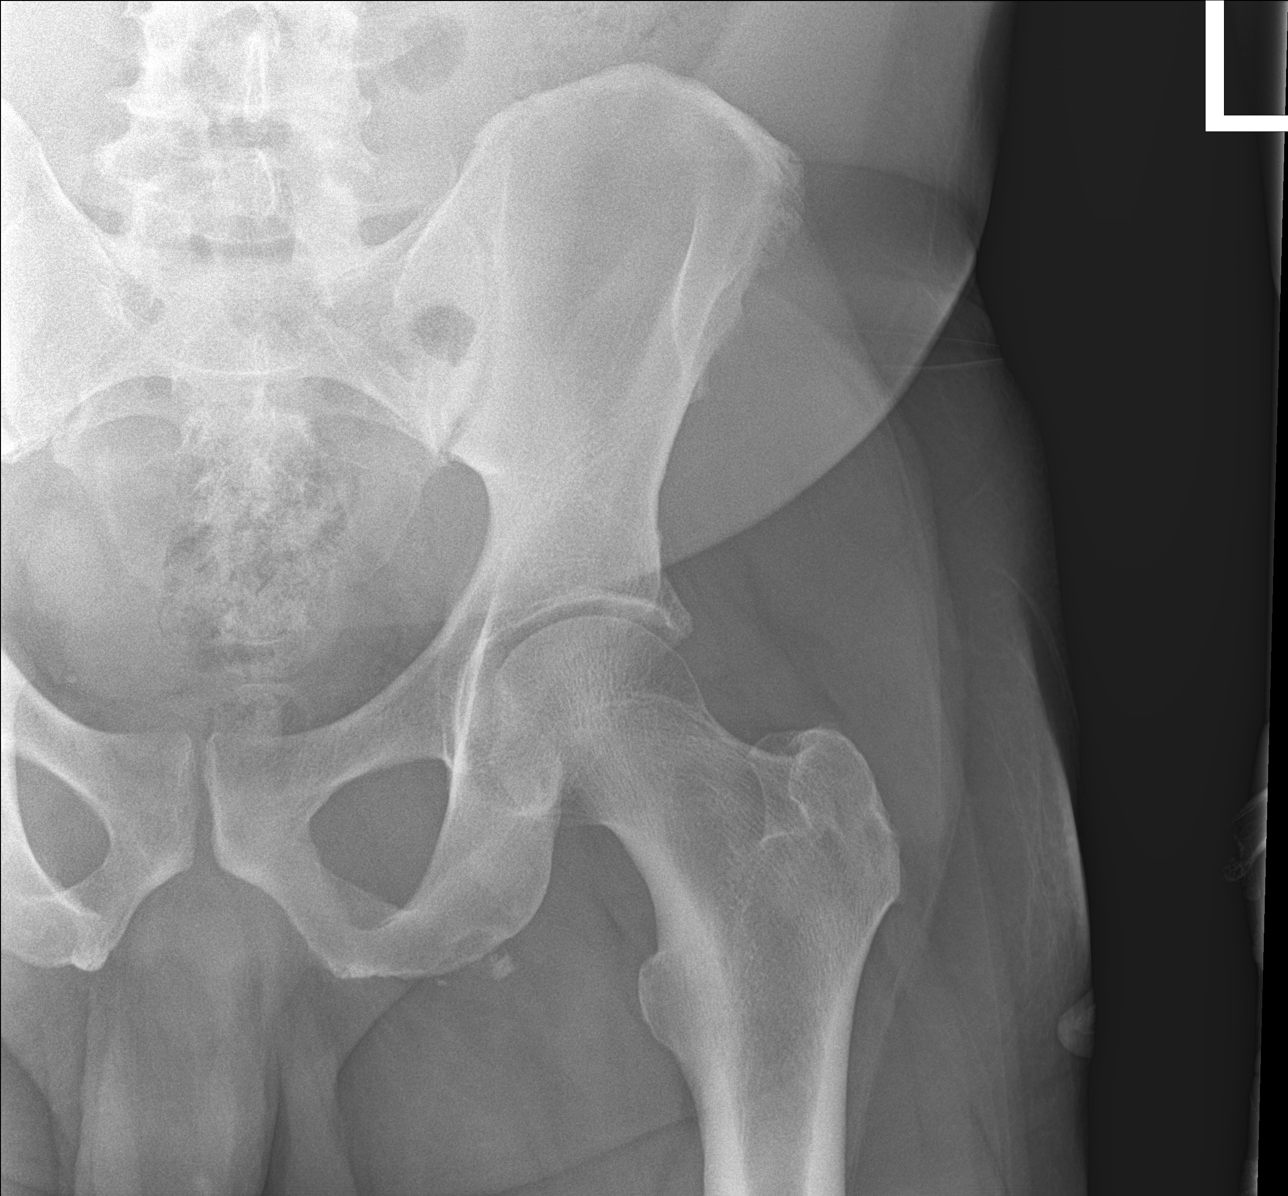

[hip lat]
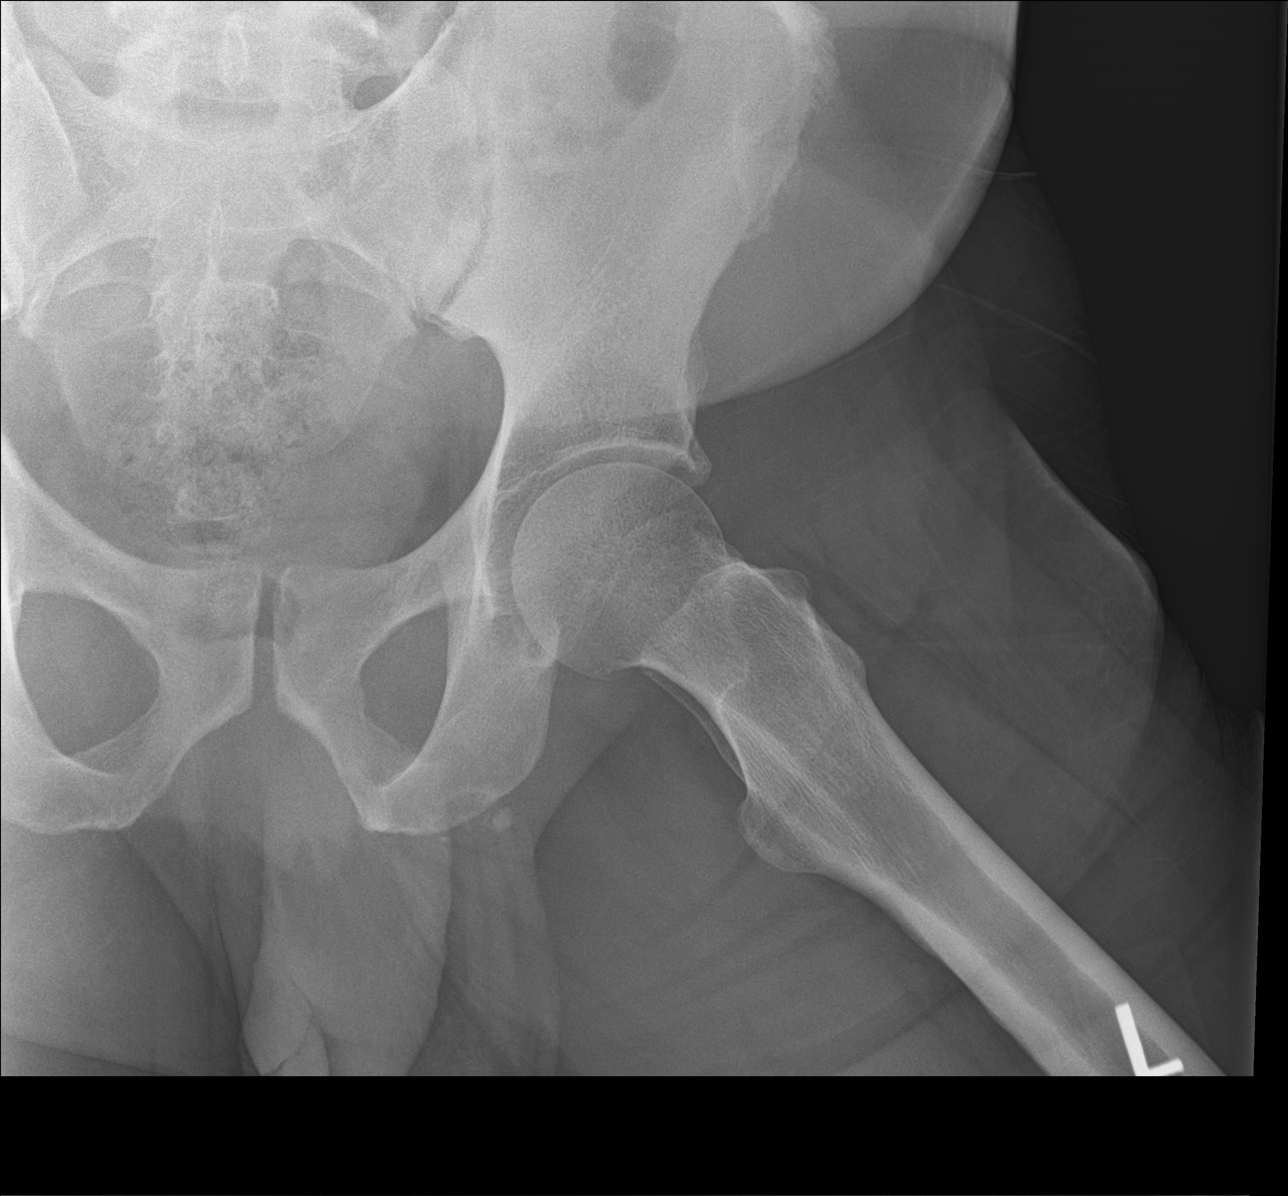

[3 of 3 positions shown; findings below may reference images not displayed]

FINDINGS: No fracture or dislocation is seen.

Bilateral hip joint spaces are preserved.

Visualized bony pelvis appears intact.

Mild degenerative changes of the lower lumbar spine.
IMPRESSION: Negative.

## 2017-08-16 ENCOUNTER — Other Ambulatory Visit (INDEPENDENT_AMBULATORY_CARE_PROVIDER_SITE_OTHER): Payer: Federal, State, Local not specified - PPO

## 2017-08-16 DIAGNOSIS — Z0001 Encounter for general adult medical examination with abnormal findings: Secondary | ICD-10-CM

## 2017-08-16 DIAGNOSIS — E291 Testicular hypofunction: Secondary | ICD-10-CM | POA: Diagnosis not present

## 2017-08-16 DIAGNOSIS — E119 Type 2 diabetes mellitus without complications: Secondary | ICD-10-CM

## 2017-08-16 DIAGNOSIS — Z114 Encounter for screening for human immunodeficiency virus [HIV]: Secondary | ICD-10-CM

## 2017-08-16 LAB — URINALYSIS, ROUTINE W REFLEX MICROSCOPIC
Bilirubin Urine: NEGATIVE
Hgb urine dipstick: NEGATIVE
Ketones, ur: NEGATIVE
Leukocytes, UA: NEGATIVE
Nitrite: NEGATIVE
RBC / HPF: NONE SEEN (ref 0–?)
Specific Gravity, Urine: 1.02 (ref 1.000–1.030)
Total Protein, Urine: NEGATIVE
Urine Glucose: >=1000 — AB
Urobilinogen, UA: 0.2 (ref 0.0–1.0)
pH: 5.5 (ref 5.0–8.0)

## 2017-08-16 LAB — BASIC METABOLIC PANEL
BUN: 12 mg/dL (ref 6–23)
CO2: 28 mEq/L (ref 19–32)
Calcium: 9.9 mg/dL (ref 8.4–10.5)
Chloride: 101 mEq/L (ref 96–112)
Creatinine, Ser: 0.99 mg/dL (ref 0.40–1.50)
GFR: 99.04 mL/min (ref >60.00)
Glucose, Bld: 318 mg/dL — ABNORMAL HIGH (ref 70–99)
Potassium: 4.5 mEq/L (ref 3.5–5.1)
Sodium: 138 mEq/L (ref 135–145)

## 2017-08-16 LAB — HEPATIC FUNCTION PANEL
ALT: 54 U/L — ABNORMAL HIGH (ref 0–53)
AST: 40 U/L — ABNORMAL HIGH (ref 0–37)
Albumin: 4.5 g/dL (ref 3.5–5.2)
Alkaline Phosphatase: 56 U/L (ref 39–117)
Bilirubin, Direct: 0.1 mg/dL (ref 0.0–0.3)
Total Bilirubin: 0.5 mg/dL (ref 0.2–1.2)
Total Protein: 7.5 g/dL (ref 6.0–8.3)

## 2017-08-16 LAB — CBC WITH DIFFERENTIAL/PLATELET
Basophils Absolute: 0.1 10*3/uL (ref 0.0–0.1)
Basophils Relative: 0.7 % (ref 0.0–3.0)
Eosinophils Absolute: 0.3 10*3/uL (ref 0.0–0.7)
Eosinophils Relative: 3.9 % (ref 0.0–5.0)
HCT: 46 % (ref 39.0–52.0)
Hemoglobin: 14.3 g/dL (ref 13.0–17.0)
Lymphocytes Relative: 36.1 % (ref 12.0–46.0)
Lymphs Abs: 2.5 10*3/uL (ref 0.7–4.0)
MCHC: 31.2 g/dL (ref 30.0–36.0)
MCV: 73.4 fl — ABNORMAL LOW (ref 78.0–100.0)
Monocytes Absolute: 0.6 10*3/uL (ref 0.1–1.0)
Monocytes Relative: 9.1 % (ref 3.0–12.0)
Neutro Abs: 3.4 10*3/uL (ref 1.4–7.7)
Neutrophils Relative %: 50.2 % (ref 43.0–77.0)
Platelets: 155 10*3/uL (ref 150.0–400.0)
RBC: 6.26 Mil/uL — ABNORMAL HIGH (ref 4.22–5.81)
RDW: 16.6 % — ABNORMAL HIGH (ref 11.5–15.5)
WBC: 6.8 10*3/uL (ref 4.0–10.5)

## 2017-08-16 LAB — LIPID PANEL
Cholesterol: 146 mg/dL (ref 0–200)
HDL: 48.4 mg/dL (ref >39.00)
LDL Cholesterol: 72 mg/dL (ref 0–99)
NonHDL: 97.72
Total CHOL/HDL Ratio: 3
Triglycerides: 127 mg/dL (ref 0.0–149.0)
VLDL: 25.4 mg/dL (ref 0.0–40.0)

## 2017-08-16 LAB — MICROALBUMIN / CREATININE URINE RATIO
Creatinine,U: 127.5 mg/dL
Microalb Creat Ratio: 4.2 mg/g (ref 0.0–30.0)
Microalb, Ur: 5.4 mg/dL — ABNORMAL HIGH (ref 0.0–1.9)

## 2017-08-16 LAB — HEMOGLOBIN A1C: Hgb A1c MFr Bld: 12.2 % — ABNORMAL HIGH (ref 4.6–6.5)

## 2017-08-16 LAB — TESTOSTERONE: Testosterone: 300 ng/dL (ref 300.00–890.00)

## 2017-08-16 LAB — PSA: PSA: 1.15 ng/mL (ref 0.10–4.00)

## 2017-08-16 LAB — TSH: TSH: 1.68 u[IU]/mL (ref 0.35–4.50)

## 2017-08-17 LAB — HIV ANTIBODY (ROUTINE TESTING W REFLEX): HIV 1&2 Ab, 4th Generation: NONREACTIVE

## 2017-08-24 ENCOUNTER — Encounter: Payer: Self-pay | Admitting: Internal Medicine

## 2017-08-24 ENCOUNTER — Ambulatory Visit (INDEPENDENT_AMBULATORY_CARE_PROVIDER_SITE_OTHER): Payer: Federal, State, Local not specified - PPO | Admitting: Internal Medicine

## 2017-08-24 VITALS — BP 128/90 | HR 97 | Temp 98.2°F | Ht 74.0 in | Wt 261.0 lb

## 2017-08-24 DIAGNOSIS — E119 Type 2 diabetes mellitus without complications: Secondary | ICD-10-CM

## 2017-08-24 DIAGNOSIS — Z0000 Encounter for general adult medical examination without abnormal findings: Secondary | ICD-10-CM

## 2017-08-24 DIAGNOSIS — E291 Testicular hypofunction: Secondary | ICD-10-CM

## 2017-08-24 DIAGNOSIS — Z860100 Personal history of colon polyps, unspecified: Secondary | ICD-10-CM | POA: Insufficient documentation

## 2017-08-24 MED ORDER — TESTOSTERONE CYPIONATE 100 MG/ML IM SOLN: 1.5000 mg | INTRAMUSCULAR | 5 refills | Status: DC

## 2017-08-24 MED ORDER — IRBESARTAN 150 MG PO TABS: 150.0000 mg | ORAL_TABLET | Freq: Every day | ORAL | 3 refills | Status: DC

## 2017-08-24 MED ORDER — CLINDAMYCIN PHOSPHATE 1 % EX SOLN: CUTANEOUS | 5 refills | Status: DC

## 2017-08-24 MED ORDER — DULAGLUTIDE 1.5 MG/0.5ML ~~LOC~~ SOAJ: 0.5000 mL | SUBCUTANEOUS | 3 refills | Status: DC

## 2017-08-24 MED ORDER — TESTOSTERONE CYPIONATE 100 MG/ML IM SOLN: 150.0000 mg | INTRAMUSCULAR | 5 refills | Status: DC

## 2017-08-24 MED ORDER — METFORMIN HCL 500 MG PO TABS: 500.0000 mg | ORAL_TABLET | Freq: Four times a day (QID) | ORAL | 3 refills | Status: DC

## 2017-08-24 MED ORDER — GLIMEPIRIDE 4 MG PO TABS: 4.0000 mg | ORAL_TABLET | Freq: Every day | ORAL | 3 refills | Status: DC

## 2017-08-24 NOTE — Progress Notes (Signed)
Subjective:    Patient ID: [name], male    DOB: 11/07/1956, 61 y.o.   MRN: [id number]  HPI  Here for wellness and f/u;  Overall doing ok;  Pt denies Chest pain, worsening SOB, DOE, wheezing, orthopnea, PND, worsening LE edema, palpitations, dizziness or syncope.  Pt denies neurological change such as new headache, facial or extremity weakness.  Pt denies polydipsia, polyuria, or low sugar symptoms. Pt states overall good compliance with treatment and medications, good tolerability, and has been trying to follow appropriate diet.  Pt denies worsening depressive symptoms, suicidal ideation or panic. No fever, night sweats, wt loss, loss of appetite, or other constitutional symptoms.  Pt states good ability with ADL's, has low fall risk, home safety reviewed and adequate, no other significant changes in hearing or vision, and only occasionally active with exercise.  Admits to dietary indiscretion more recently with persistently elevated, has been through nutrition, states he knows what to do, just hard to get the diet right due to stress.  Good compliance with meds.   Wt Readings from Last 3 Encounters:  08/24/17 261 lb (118.4 kg)  07/12/17 266 lb (120.7 kg)  02/18/17 265 lb (120.2 kg)   Past Medical History:  Diagnosis Date  . ABNORMAL CHEST XRAY 2009  . Biceps tendon rupture   . DIABETES MELLITUS, TYPE II   . ERECTILE DYSFUNCTION, MILD   . Hyperlipidemia 07/14/2016  . HYPERTENSION   . Hypogonadism male 03/08/2014  . Hypogonadism, male   . Overweight(278.02)   . PSEUDOFOLLICULITIS BARBAE   . PULMONARY NODULE, RIGHT UPPER LOBE 02/10/2009   Past Surgical History:  Procedure Laterality Date  . NASAL POLYP SURGERY      reports that he quit smoking about 25 years ago. He has never used smokeless tobacco. He reports that he does not drink alcohol or use drugs. family history includes Cancer in his brother; Diabetes in his mother; Hypertension in his mother; Kidney failure in his  father. No Known Allergies Current Outpatient Prescriptions on File Prior to Visit  Medication Sig Dispense Refill  . aspirin EC 81 MG tablet Take 1 tablet (81 mg total) by mouth daily. 90 tablet 11  . HYDROcodone-acetaminophen (NORCO/VICODIN) 5-325 MG tablet Take 2 tablets by mouth every 4 (four) hours as needed. 6 tablet 0   No current facility-administered medications on file prior to visit.    Review of Systems Constitutional: Negative for other unusual diaphoresis, sweats, appetite or weight changes HENT: Negative for other worsening hearing loss, ear pain, facial swelling, mouth sores or neck stiffness.   Eyes: Negative for other worsening pain, redness or other visual disturbance.  Respiratory: Negative for other stridor or swelling Cardiovascular: Negative for other palpitations or other chest pain  Gastrointestinal: Negative for worsening diarrhea or loose stools, blood in stool, distention or other pain Genitourinary: Negative for hematuria, flank pain or other change in urine volume.  Musculoskeletal: Negative for myalgias or other joint swelling.  Skin: Negative for other color change, or other wound or worsening drainage.  Neurological: Negative for other syncope or numbness. Hematological: Negative for other adenopathy or swelling Psychiatric/Behavioral: Negative for hallucinations, other worsening agitation, SI, self-injury, or new decreased concentration All other system neg per pt    Objective:   Physical Exam BP 128/90   Pulse 97   Temp 98.2 F (36.8 C) (Oral)   Ht 6\' 2"  (1.88 m)   Wt 261 lb (118.4 kg)   SpO2 99%   BMI 33.51 kg/m  VS noted,  Constitutional: Pt is oriented to person, place, and time. Appears well-developed and well-nourished, in no significant distress and comfortable Head: Normocephalic and atraumatic  Eyes: Conjunctivae and EOM are normal. Pupils are equal, round, and reactive to light Right Ear: External ear normal without discharge Left  Ear: External ear normal without discharge Nose: Nose without discharge or deformity Mouth/Throat: Oropharynx is without other ulcerations and moist  Neck: Normal range of motion. Neck supple. No JVD present. No tracheal deviation present or significant neck LA or mass Cardiovascular: Normal rate, regular rhythm, normal heart sounds and intact distal pulses.   Pulmonary/Chest: WOB normal and breath sounds without rales or wheezing  Abdominal: Soft. Bowel sounds are normal. NT. No HSM  Musculoskeletal: Normal range of motion. Exhibits no edema Lymphadenopathy: Has no other cervical adenopathy.  Neurological: Pt is alert and oriented to person, place, and time. Pt has normal reflexes. No cranial nerve deficit. Motor grossly intact, Gait intact Skin: Skin is warm and dry. No rash noted or new ulcerations Psychiatric:  Has normal mood and affect. Behavior is normal without agitation Lab Results  Component Value Date   WBC 6.8 08/16/2017   HGB 14.3 08/16/2017   HCT 46.0 08/16/2017   PLT 155.0 08/16/2017   GLUCOSE 318 (H) 08/16/2017   CHOL 146 08/16/2017   TRIG 127.0 08/16/2017   HDL 48.40 08/16/2017   LDLCALC 72 08/16/2017   ALT 54 (H) 08/16/2017   AST 40 (H) 08/16/2017   NA 138 08/16/2017   K 4.5 08/16/2017   CL 101 08/16/2017   CREATININE 0.99 08/16/2017   BUN 12 08/16/2017   CO2 28 08/16/2017   TSH 1.68 08/16/2017   PSA 1.15 08/16/2017   HGBA1C 12.2 (H) 08/16/2017   MICROALBUR 5.4 (H) 08/16/2017      Assessment & Plan:

## 2017-08-24 NOTE — Patient Instructions (Addendum)
You will be contacted regarding the referral for: colonoscopy  Please take all new medication as prescribed - the trulicity  Please continue all other medications as before, and refills have been done if requested  - the testosterone and other usual meds  Please have the pharmacy call with any other refills you may need.  Please continue your efforts at being more active, low cholesterol diet, and weight control.  You are otherwise up to date with prevention measures today.  Please keep your appointments with your specialists as you may have planned  You will be contacted regarding the referral for: Endocrinology (already placed referral recently)  Please return in 6 months, or sooner if needed, with Lab testing done 3-5 days before

## 2017-08-27 NOTE — Assessment & Plan Note (Signed)
For colonoscopy as is due 

## 2017-08-27 NOTE — Assessment & Plan Note (Signed)

## 2017-08-27 NOTE — Assessment & Plan Note (Signed)
For f/u lab today 

## 2017-08-27 NOTE — Assessment & Plan Note (Addendum)
Severe uncontrolled, for endo referral, to add trulicity asd, for diet and med better compliance, cont exercise and wt control, to f/u any worsening symptoms or concerns

## 2017-09-29 DIAGNOSIS — H40013 Open angle with borderline findings, low risk, bilateral: Secondary | ICD-10-CM | POA: Diagnosis not present

## 2017-10-24 ENCOUNTER — Telehealth: Payer: Self-pay | Admitting: Internal Medicine

## 2017-10-24 MED ORDER — TESTOSTERONE CYPIONATE 100 MG/ML IM SOLN: 150.0000 mg | INTRAMUSCULAR | 5 refills | Status: DC

## 2017-10-24 NOTE — Telephone Encounter (Signed)
Called pt no answer LMOM rx sent to [facility] 

## 2017-10-24 NOTE — Telephone Encounter (Signed)
Pt called and has lost his testosterone cypionate (DEPOTESTOTERONE CYPIONATE) 100 MG/ML  He would like a refill of this went to the [facility] on [address]  Please advise and call if any issues

## 2017-10-24 NOTE — Telephone Encounter (Signed)
Done erx 

## 2017-10-25 ENCOUNTER — Telehealth: Payer: Self-pay

## 2017-10-25 ENCOUNTER — Telehealth: Payer: Self-pay | Admitting: *Deleted

## 2017-10-25 NOTE — Telephone Encounter (Signed)
Yes, that should be ok for 150 mg, thanks

## 2017-10-25 NOTE — Telephone Encounter (Signed)
Rec'd call from simone (pharmacist) w/[facility] stating needing to verify testosterone cypionate script that was sent yesterday. Last script pt was getting the testosterone cypionate 100 mg, but rx that was sent yesterday was for 150 mg. Wanting to know is strength correct, and ok to fill...[name]

## 2017-10-25 NOTE — Telephone Encounter (Signed)
Per [employer] the PA was denied.  Determination sent to scan.

## 2017-10-26 NOTE — Telephone Encounter (Signed)
Notified pharmacy Nolon Rod) w/Md response...Johny Chess

## 2017-11-10 ENCOUNTER — Telehealth: Payer: Self-pay | Admitting: Internal Medicine

## 2017-11-10 NOTE — Telephone Encounter (Signed)
I am not sure, since I would not know what the insurance will pay for.  This is unusual, but appears to be a new thing for other patients as well this year.    Can pt call his insurance to find out which form of testosterone is covered? thanks

## 2017-11-10 NOTE — Telephone Encounter (Signed)
Pt has been informed and expressed understanding.  

## 2017-11-10 NOTE — Telephone Encounter (Signed)
Copied from [place]. Topic: Quick Communication - Other Results >> Nov 10, 2017 12:16 PM [name] I, NT wrote: Pt call and said the insure company don,t want to pay for Depotestosterone 100 Mg Injection his what to know if anything [name] can Do about it

## 2017-11-17 DIAGNOSIS — Z0279 Encounter for issue of other medical certificate: Secondary | ICD-10-CM

## 2017-12-13 ENCOUNTER — Telehealth: Payer: Self-pay | Admitting: Internal Medicine

## 2017-12-13 NOTE — Telephone Encounter (Signed)
°  Relation to pt: self  Call back number: [phone] (M)   Reason for call:  Patient states FMLA forms were not faxed back within the 10 day grace period and [employer] requesting documentation reflecting why, please advise

## 2017-12-13 NOTE — Telephone Encounter (Signed)
Copied from [place] [id number]. Topic: Inquiry >> Nov 14, 2017  9:29 AM Scherrie Gerlach wrote: Reason for CRM: pt states they have extended his FMLA paperwork to 12/24 to be in their office. [employer].  Pt hopes you can have that done by then Please fax to them.  Pt would like a copy as well. >> Nov 14, 2017 10:27 AM Para Skeans A wrote: Noted. Forms can not be completed until MD is back in office and has approved it. Once that has been done I follow up with patient. He will be return on 12/19. >> Nov 16, 2017 12:49 PM Morphies, Isidoro Donning wrote: Patient called back today checking on this. Please advise.  >> Nov 16, 2017  3:45 PM Para Skeans A wrote: I have informed multiple times the MD has been out of office and did not return until 12/19 (TODAY). I am wanting on him to approve and sign the forms.  >> Nov 18, 2017 11:14 AM Yvette Rack wrote: Patient is calling back about the statues of his FMLA forms he was informed that the Doctor returned back to office on the 19th please call pt when paperwork is ready [phone] >> Nov 18, 2017  2:27 PM Lonia Skinner, [place] A wrote: LVM to informs have been completed, &faxed, I left on VM I will mail them but just seen he wants to pick up. They are here for pick up.  >> Dec 05, 2017 10:33 AM Robina Ade, Helene Kelp D wrote: Patient called and would like to talk to [place] about a clarification on his FMLA paperwork, please call patient back, thanks. >> Dec 06, 2017  9:10 AM Para Skeans A wrote: Form has been completed & faxed to employer.

## 2017-12-15 ENCOUNTER — Encounter: Payer: Self-pay | Admitting: Internal Medicine

## 2017-12-15 NOTE — Telephone Encounter (Signed)
A letter has been faxed to [employer].   LVM to inform patient.

## 2017-12-16 DIAGNOSIS — Z461 Encounter for fitting and adjustment of hearing aid: Secondary | ICD-10-CM | POA: Diagnosis not present

## 2018-02-22 ENCOUNTER — Ambulatory Visit: Payer: Federal, State, Local not specified - PPO | Admitting: Internal Medicine

## 2018-03-07 ENCOUNTER — Ambulatory Visit: Payer: Federal, State, Local not specified - PPO | Admitting: Internal Medicine

## 2018-03-07 ENCOUNTER — Encounter: Payer: Self-pay | Admitting: Internal Medicine

## 2018-03-07 ENCOUNTER — Other Ambulatory Visit (INDEPENDENT_AMBULATORY_CARE_PROVIDER_SITE_OTHER): Payer: Federal, State, Local not specified - PPO

## 2018-03-07 ENCOUNTER — Other Ambulatory Visit: Payer: Self-pay | Admitting: Internal Medicine

## 2018-03-07 VITALS — BP 136/90 | HR 77 | Temp 98.2°F | Ht 74.0 in | Wt 256.0 lb

## 2018-03-07 DIAGNOSIS — E119 Type 2 diabetes mellitus without complications: Secondary | ICD-10-CM

## 2018-03-07 DIAGNOSIS — E291 Testicular hypofunction: Secondary | ICD-10-CM | POA: Diagnosis not present

## 2018-03-07 DIAGNOSIS — Z0000 Encounter for general adult medical examination without abnormal findings: Secondary | ICD-10-CM | POA: Diagnosis not present

## 2018-03-07 DIAGNOSIS — E785 Hyperlipidemia, unspecified: Secondary | ICD-10-CM | POA: Diagnosis not present

## 2018-03-07 LAB — BASIC METABOLIC PANEL
BUN: 12 mg/dL (ref 6–23)
CO2: 30 mEq/L (ref 19–32)
Calcium: 9.8 mg/dL (ref 8.4–10.5)
Chloride: 104 mEq/L (ref 96–112)
Creatinine, Ser: 0.8 mg/dL (ref 0.40–1.50)
GFR: 126.41 mL/min (ref >60.00)
Glucose, Bld: 202 mg/dL — ABNORMAL HIGH (ref 70–99)
Potassium: 4.1 mEq/L (ref 3.5–5.1)
Sodium: 140 mEq/L (ref 135–145)

## 2018-03-07 LAB — LIPID PANEL
Cholesterol: 121 mg/dL (ref 0–200)
HDL: 39.5 mg/dL (ref >39.00)
LDL Cholesterol: 54 mg/dL (ref 0–99)
NonHDL: 81.3
Total CHOL/HDL Ratio: 3
Triglycerides: 138 mg/dL (ref 0.0–149.0)
VLDL: 27.6 mg/dL (ref 0.0–40.0)

## 2018-03-07 LAB — HEPATIC FUNCTION PANEL
ALT: 36 U/L (ref 0–53)
AST: 25 U/L (ref 0–37)
Albumin: 4.2 g/dL (ref 3.5–5.2)
Alkaline Phosphatase: 56 U/L (ref 39–117)
Bilirubin, Direct: 0.1 mg/dL (ref 0.0–0.3)
Total Bilirubin: 0.4 mg/dL (ref 0.2–1.2)
Total Protein: 7.6 g/dL (ref 6.0–8.3)

## 2018-03-07 LAB — HEMOGLOBIN A1C: Hgb A1c MFr Bld: 10 % — ABNORMAL HIGH (ref 4.6–6.5)

## 2018-03-07 NOTE — Assessment & Plan Note (Signed)
Ok for testosterone levels today

## 2018-03-07 NOTE — Assessment & Plan Note (Signed)
Remains likely chronically severe uncontrolled, d/w pt again the risk of low intensity treatment as he is doing which will lead to multiple complications and early death.  For a1c with labs today, declines endo referral

## 2018-03-07 NOTE — Assessment & Plan Note (Signed)
Lab Results  Component Value Date   LDLCALC 54 03/07/2018  stable overall by history and exam, recent data reviewed with pt, and pt to continue medical treatment as before,  to f/u any worsening symptoms or concerns

## 2018-03-07 NOTE — Progress Notes (Addendum)
Subjective:    Patient ID: [name], male    DOB: 1956-01-30, 62 y.o.   MRN: [id number]  HPI  Here to f/u; overall doing ok,  Pt denies chest pain, increasing sob or doe, wheezing, orthopnea, PND, increased LE swelling, palpitations, dizziness or syncope.  Pt denies new neurological symptoms such as new headache, or facial or extremity weakness or numbness.  Pt denies polydipsia, polyuria, or low sugar episode.  Pt states overall good compliance with meds, mostly trying to follow appropriate diet, with wt overall stable,  but little exercise however, and has a long hx of noncompliance and not wanting insulin tx. Now  Not taking the trulicity from last visit , just did not want to do this after 2 shots , "Im trying to modify my diet", but willing to start "if needed." and has med at home to use   He is more concerned about his testosterone level today to seeing if the OTC preps to "increase the free testosterone" is working.  No other interval hx or change  Last A1c > 12 at last visit Past Medical History:  Diagnosis Date  . ABNORMAL CHEST XRAY 2009  . Biceps tendon rupture   . DIABETES MELLITUS, TYPE II   . ERECTILE DYSFUNCTION, MILD   . Hyperlipidemia 07/14/2016  . HYPERTENSION   . Hypogonadism male 03/08/2014  . Hypogonadism, male   . Overweight(278.02)   . PSEUDOFOLLICULITIS BARBAE   . PULMONARY NODULE, RIGHT UPPER LOBE 02/10/2009   Past Surgical History:  Procedure Laterality Date  . NASAL POLYP SURGERY      reports that he quit smoking about 26 years ago. He has never used smokeless tobacco. He reports that he does not drink alcohol or use drugs. family history includes Cancer in his brother; Diabetes in his mother; Hypertension in his mother; Kidney failure in his father. No Known Allergies Current Outpatient Medications on File Prior to Visit  Medication Sig Dispense Refill  . aspirin EC 81 MG tablet Take 1 tablet (81 mg total) by mouth daily. 90 tablet 11  . clindamycin  (CLEOCIN T) 1 % external solution APPLY EXTERNALLY TO THE AFFECTED AREA TWICE DAILY 60 mL 5  . Dulaglutide (TRULICITY) 1.5 UY/4.0HK SOPN Inject 0.5 mLs into the skin once a week. 12 pen 3  . glimepiride (AMARYL) 4 MG tablet Take 1 tablet (4 mg total) by mouth daily with breakfast. 90 tablet 3  . HYDROcodone-acetaminophen (NORCO/VICODIN) 5-325 MG tablet Take 2 tablets by mouth every 4 (four) hours as needed. 6 tablet 0  . irbesartan (AVAPRO) 150 MG tablet Take 1 tablet (150 mg total) by mouth daily. 90 tablet 3  . metFORMIN (GLUCOPHAGE) 500 MG tablet Take 1 tablet (500 mg total) by mouth 4 (four) times daily. 360 tablet 3   No current facility-administered medications on file prior to visit.    Review of Systems  Constitutional: Negative for other unusual diaphoresis or sweats HENT: Negative for ear discharge or swelling Eyes: Negative for other worsening visual disturbances Respiratory: Negative for stridor or other swelling  Gastrointestinal: Negative for worsening distension or other blood Genitourinary: Negative for retention or other urinary change Musculoskeletal: Negative for other MSK pain or swelling Skin: Negative for color change or other new lesions Neurological: Negative for worsening tremors and other numbness  Psychiatric/Behavioral: Negative for worsening agitation or other fatigue All other system neg per pt    Objective:   Physical Exam BP 136/90   Pulse 77   Temp  98.2 F (36.8 C) (Oral)   Ht 6\' 2"  (1.88 m)   Wt 256 lb (116.1 kg)   SpO2 96%   BMI 32.87 kg/m  VS noted, not ill appearing Constitutional: Pt appears in NAD HENT: Head: NCAT.  Right Ear: External ear normal.  Left Ear: External ear normal.  Eyes: . Pupils are equal, round, and reactive to light. Conjunctivae and EOM are normal Nose: without d/c or deformity Neck: Neck supple. Gross normal ROM Cardiovascular: Normal rate and regular rhythm.   Pulmonary/Chest: Effort normal and breath sounds without  rales or wheezing.  Abd:  Soft, NT, ND, + BS, no organomegaly Neurological: Pt is alert. At baseline orientation, motor grossly intact Skin: Skin is warm. No rashes, other new lesions, no LE edema Psychiatric: Pt behavior is normal without agitation  No other exam findings     Assessment & Plan:

## 2018-03-07 NOTE — Patient Instructions (Signed)
Please continue all other medications as before, and refills have been done if requested.  Please have the pharmacy call with any other refills you may need.  Please continue your efforts at being more active, low cholesterol diet, and weight control.  You are otherwise up to date with prevention measures today.  Please keep your appointments with your specialists as you may have planned  Please return in 6 months, or sooner if needed, with Lab testing done 3-5 days before  

## 2018-03-08 ENCOUNTER — Telehealth: Payer: Self-pay

## 2018-03-08 DIAGNOSIS — H40013 Open angle with borderline findings, low risk, bilateral: Secondary | ICD-10-CM | POA: Diagnosis not present

## 2018-03-08 DIAGNOSIS — E113293 Type 2 diabetes mellitus with mild nonproliferative diabetic retinopathy without macular edema, bilateral: Secondary | ICD-10-CM | POA: Diagnosis not present

## 2018-03-08 DIAGNOSIS — H2513 Age-related nuclear cataract, bilateral: Secondary | ICD-10-CM | POA: Diagnosis not present

## 2018-03-08 DIAGNOSIS — H0289 Other specified disorders of eyelid: Secondary | ICD-10-CM | POA: Diagnosis not present

## 2018-03-08 LAB — TESTOSTERONE, FREE, TOTAL, SHBG
Sex Hormone Binding: 22.5 nmol/L (ref 19.3–76.4)
Testosterone, Free: 5.3 pg/mL — ABNORMAL LOW (ref 6.6–18.1)
Testosterone: 159 ng/dL — ABNORMAL LOW (ref 264–916)

## 2018-03-08 LAB — HM DIABETES EYE EXAM

## 2018-03-08 MED ORDER — TESTOSTERONE 50 MG/5GM (1%) TD GEL
5.0000 g | Freq: Every day | TRANSDERMAL | 1 refills | Status: DC
Start: 2018-03-08 — End: 2019-01-01

## 2018-03-08 NOTE — Telephone Encounter (Signed)
Ok, this is done 

## 2018-03-08 NOTE — Addendum Note (Signed)
Addended by: Biagio Borg on: 03/08/2018 12:47 PM   Modules accepted: Orders

## 2018-03-08 NOTE — Telephone Encounter (Signed)
Pt has been informed and expressed understanding but he would like to possible try the testosterone gel that was discussed during his OV. Please advise.

## 2018-03-08 NOTE — Telephone Encounter (Signed)
-----   Message from Biagio Borg, MD sent at 03/07/2018  7:23 PM EDT ----- Left message on MyChart, pt to cont same tx except  The test results show that your current treatment is OK, except the A1c is still severely high.  Please start the trulicity as we discussed, and I will need to refer you to Endocrinology for further consideration.    Shirron to please inform pt, I will do referral

## 2018-03-10 ENCOUNTER — Telehealth: Payer: Self-pay | Admitting: Internal Medicine

## 2018-03-10 NOTE — Telephone Encounter (Signed)
Copied from [place] [id number]. Topic: Quick Communication - See Telephone Encounter >> Mar 10, 2018 10:58 AM Corie Chiquito, NT wrote: CRM for notification. Patient calling because he spoke with the pharmacy and was told that he needs a PA for his Testosterone Gel 50 mg. [facility] [address] [phone]. If someone could give him a call back about this at [phone]

## 2018-03-10 NOTE — Telephone Encounter (Signed)
Patient is calling about a prior authorization for the Androgel. Please call him.

## 2018-03-14 NOTE — Telephone Encounter (Signed)
Key: [id number]

## 2018-03-14 NOTE — Telephone Encounter (Signed)
PA was denied.  Pt was informed and expressed understanding the reasoning for the denial listed on the determination.  Determination has been sent to scan

## 2018-03-14 NOTE — Telephone Encounter (Signed)
Pt following up on the PA for his testosterone. Pt would like a call back to let him know the status of the prior. [phone]

## 2018-03-29 ENCOUNTER — Telehealth: Payer: Self-pay | Admitting: Internal Medicine

## 2018-03-29 MED ORDER — TELMISARTAN 40 MG PO TABS: 40.0000 mg | ORAL_TABLET | Freq: Every day | ORAL | 3 refills | Status: DC

## 2018-03-29 NOTE — Telephone Encounter (Signed)
Ok for change irbesartan to micardis 40 mg daily due to former's backorder  OK to let pt know

## 2018-03-29 NOTE — Telephone Encounter (Signed)
Pt has been informed and expressed understanding.  

## 2018-04-06 DIAGNOSIS — N5203 Combined arterial insufficiency and corporo-venous occlusive erectile dysfunction: Secondary | ICD-10-CM | POA: Insufficient documentation

## 2018-04-06 DIAGNOSIS — E291 Testicular hypofunction: Secondary | ICD-10-CM | POA: Diagnosis not present

## 2018-04-12 ENCOUNTER — Other Ambulatory Visit (HOSPITAL_COMMUNITY)
Admission: RE | Admit: 2018-04-12 | Discharge: 2018-04-12 | Disposition: A | Discharge status: Home / Self Care | Payer: Federal, State, Local not specified - PPO | Source: Ambulatory Visit | Attending: Oncology | Admitting: Oncology

## 2018-04-12 ENCOUNTER — Other Ambulatory Visit: Payer: Federal, State, Local not specified - PPO

## 2018-04-12 DIAGNOSIS — E291 Testicular hypofunction: Secondary | ICD-10-CM | POA: Insufficient documentation

## 2018-04-13 LAB — TESTOSTERONE: Testosterone: 178 ng/dL — ABNORMAL LOW (ref 264–916)

## 2018-04-24 ENCOUNTER — Encounter: Payer: Self-pay | Admitting: Family Medicine

## 2018-05-07 ENCOUNTER — Encounter (HOSPITAL_COMMUNITY): Payer: Self-pay | Admitting: Emergency Medicine

## 2018-05-07 ENCOUNTER — Ambulatory Visit (HOSPITAL_COMMUNITY)
Admission: EM | Admit: 2018-05-07 | Discharge: 2018-05-07 | Disposition: A | Discharge status: Home / Self Care | Payer: Federal, State, Local not specified - PPO | Attending: Family Medicine | Admitting: Family Medicine

## 2018-05-07 DIAGNOSIS — M6530 Trigger finger, unspecified finger: Secondary | ICD-10-CM | POA: Diagnosis not present

## 2018-05-07 DIAGNOSIS — I10 Essential (primary) hypertension: Secondary | ICD-10-CM

## 2018-05-07 MED ORDER — NAPROXEN 375 MG PO TABS: 375.0000 mg | ORAL_TABLET | Freq: Two times a day (BID) | ORAL | 0 refills | Status: DC | PRN

## 2018-05-07 NOTE — Discharge Instructions (Addendum)
Rest, ice, and wear splint as directed Take naprosyn as needed for symptomatic relief Follow up with PCP or Orthopedist for further evaluation and management if symptoms persists Return or go to the ER if you have any new or worsening symptoms  Blood pressure elevated in office.  Please recheck in 24 hours.  If it continues to be greater than 140/90 please follow up with PCP for further evaluation and management.

## 2018-05-07 NOTE — ED Triage Notes (Signed)
Pt states "I think I have trigger finger in my right hand, every morning I have to straighten my middle finger out and its hurting". Pt c/o pain for a couple of weeks.

## 2018-05-07 NOTE — ED Provider Notes (Signed)
[place]   [id number] 05/07/18 Arrival Time: 6948  SUBJECTIVE: History from: patient. [name] is a 62 y.o. male complains of worsening or increasing right middle finger discomfort that began couple of weeks ago.  Denies a precipitating event or specific injury.  Localizes the pain to the right middle finger.  Describes the discomfort as intermittent and achy in character.  Has tried OTC medications like tylenol without relief.  Denies worsening factors.  Denies similar symptoms in the past.  Denies fever, chills, erythema, ecchymosis, effusion, weakness, numbness and tingling.      ROS: As per HPI.  Past Medical History:  Diagnosis Date  . ABNORMAL CHEST XRAY 2009  . Biceps tendon rupture   . DIABETES MELLITUS, TYPE II   . ERECTILE DYSFUNCTION, MILD   . Hyperlipidemia 07/14/2016  . HYPERTENSION   . Hypogonadism male 03/08/2014  . Hypogonadism, male   . Overweight(278.02)   . PSEUDOFOLLICULITIS BARBAE   . PULMONARY NODULE, RIGHT UPPER LOBE 02/10/2009   Past Surgical History:  Procedure Laterality Date  . NASAL POLYP SURGERY     No Known Allergies No current facility-administered medications on file prior to encounter.    Current Outpatient Medications on File Prior to Encounter  Medication Sig Dispense Refill  . aspirin EC 81 MG tablet Take 1 tablet (81 mg total) by mouth daily. 90 tablet 11  . clindamycin (CLEOCIN T) 1 % external solution APPLY EXTERNALLY TO THE AFFECTED AREA TWICE DAILY 60 mL 5  . Dulaglutide (TRULICITY) 1.5 NI/6.2VO SOPN Inject 0.5 mLs into the skin once a week. 12 pen 3  . glimepiride (AMARYL) 4 MG tablet Take 1 tablet (4 mg total) by mouth daily with breakfast. 90 tablet 3  . HYDROcodone-acetaminophen (NORCO/VICODIN) 5-325 MG tablet Take 2 tablets by mouth every 4 (four) hours as needed. 6 tablet 0  . metFORMIN (GLUCOPHAGE) 500 MG tablet Take 1 tablet (500 mg total) by mouth 4 (four) times daily. 360 tablet 3  . telmisartan (MICARDIS) 40  MG tablet Take 1 tablet (40 mg total) by mouth daily. 90 tablet 3  . testosterone (ANDROGEL) 50 MG/5GM (1%) GEL Place 5 g onto the skin daily. 450 g 1   Social History   Socioeconomic History  . Marital status: Married    Spouse name: Not on file  . Number of children: 2  . Years of education: Not on file  . Highest education level: Not on file  Occupational History  . Occupation: WORKS [employer]    Employer: [employer]  Social Needs  . Financial resource strain: Not on file  . Food insecurity:    Worry: Not on file    Inability: Not on file  . Transportation needs:    Medical: Not on file    Non-medical: Not on file  Tobacco Use  . Smoking status: Former Smoker    Last attempt to quit: 10/20/1991    Years since quitting: 26.5  . Smokeless tobacco: Never Used  Substance and Sexual Activity  . Alcohol use: No  . Drug use: No  . Sexual activity: Yes    Partners: Female  Lifestyle  . Physical activity:    Days per week: Not on file    Minutes per session: Not on file  . Stress: Not on file  Relationships  . Social connections:    Talks on phone: Not on file    Gets together: Not on file    Attends religious service: Not on  file    Active member of club or organization: Not on file    Attends meetings of clubs or organizations: Not on file    Relationship status: Not on file  . Intimate partner violence:    Fear of current or ex partner: Not on file    Emotionally abused: Not on file    Physically abused: Not on file    Forced sexual activity: Not on file  Other Topics Concern  . 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 Married 7 years, single 7 years, married 13 years (1995). 2 daughters- '80, '84- 2 grandchildren-'99,  '06      Regular exercise: walk 3-4 x a week; lifting light weights   Caffeine use: none   Family History  Problem Relation Age of Onset  . Hypertension Mother   . Diabetes Mother     . Kidney failure Father   . Cancer Brother        Pancreatic  . Coronary artery disease Neg Hx     OBJECTIVE:  Vitals:   05/07/18 1652  BP: (!) 168/94  Pulse: 61  Resp: 16  Temp: 98.1 F (36.7 C)  TempSrc: Oral  SpO2: 100%    General appearance: AOx3; in no acute distress.  Head: NCAT Lungs: CTA bilaterally Heart: Systolic murmur present, patient reports hx of murmur  Clear S1 and S2 without murmur, gallops, or rubs.  Radial pulses 2+ bilaterally. Musculoskeletal: Right hand  Inspection: Skin warm, dry, clear and intact without obvious erythema, effusion, or ecchymosis.  Palpation: Nontender to palpation ROM: FROM active and passive Strength: 5/5 shld abduction, 5/5 shld adduction, 5/5 elbow flexion, 5/5 elbow extension, 5/5 grip strength Skin: warm and dry Neurologic: Ambulates without difficulty; Sensation intact about the upper/ lower extremities Psychological: alert and cooperative; normal mood and affect  ASSESSMENT & PLAN:  1. Trigger finger, acquired   2. Essential hypertension     Meds ordered this encounter  Medications  . naproxen (NAPROSYN) 375 MG tablet    Sig: Take 1 tablet (375 mg total) by mouth 2 (two) times daily as needed for mild pain or moderate pain (inflammation).    Dispense:  30 tablet    Refill:  0    Order Specific Question:   Supervising Provider    Answer:   Wynona Luna [[id number]]   Rest, ice, and wear splint as directed Take naprosyn as needed for symptomatic relief Follow up with PCP or Orthopedist for further evaluation and management if symptoms persists Return or go to the ER if you have any new or worsening symptoms  Blood pressure elevated in office.  Please recheck in 24 hours.  If it continues to be greater than 140/90 please follow up with PCP for further evaluation and management.   Reviewed expectations re: course of current medical issues. Questions answered. Outlined signs and symptoms indicating need for more  acute intervention. Patient verbalized understanding. After Visit Summary given.    Lestine Box, PA-C 05/07/18 1720

## 2018-05-08 ENCOUNTER — Telehealth: Payer: Self-pay | Admitting: Internal Medicine

## 2018-05-08 NOTE — Telephone Encounter (Signed)
Patient has dropped off FMLA during Saturday Clinic.  Placing in Brittany's box for tracking.

## 2018-05-08 NOTE — Telephone Encounter (Signed)
Forms have been dropped off on a Saturday. &I worked up front on Monday 6/10.

## 2018-05-09 NOTE — Telephone Encounter (Signed)
Forms have been completed & placed in providers box to review and sign.  °

## 2018-05-11 NOTE — Telephone Encounter (Signed)
Forms have been signed, faxed to [place] @ [phone], copy sent to scan & charged for.   Patient has been informed, requested the original to be mailed to him.

## 2018-05-18 NOTE — Telephone Encounter (Signed)
Spoke with patient and the dates on the forms needed to be 04-26-18 to 10-25-18. Update has been made and I have re faxed the forms.

## 2018-05-18 NOTE — Telephone Encounter (Signed)
Patient says what was faxed to [facility] is missing the dates of the visit and time's to be excused etc. Please add the missing information and resubmit. They are due by June 27th. Please call patient to let him know if he needs to drop off the forms again.

## 2018-05-18 NOTE — Telephone Encounter (Signed)
Called patient and LVM. As far I see all this information is on the forms. I am unclear if a page did not go through on the fax, if that is the issue. Needed to speak with patient and get more information on what is going on.

## 2018-05-19 NOTE — Telephone Encounter (Signed)
[name] with the [employer] is requesting a callback to verify the intials at the bottom of the updated FMLA paper work as they do not match for Dr. Jenny Reichmann or Dr. Alain Marion. Also needing dates ranges for incapacities.

## 2018-05-22 NOTE — Telephone Encounter (Signed)
Update has been made and faxed to [employer]. Copy of this update has been mailed to patient.

## 2018-05-22 NOTE — Telephone Encounter (Signed)
Called and left Marcie Bal a message to call back. Please transfer to the office when she does. Thank you.

## 2018-06-10 ENCOUNTER — Other Ambulatory Visit: Payer: Self-pay | Admitting: Internal Medicine

## 2018-06-10 ENCOUNTER — Other Ambulatory Visit: Payer: Self-pay | Admitting: Family Medicine

## 2018-07-10 DIAGNOSIS — H0289 Other specified disorders of eyelid: Secondary | ICD-10-CM | POA: Diagnosis not present

## 2018-07-10 DIAGNOSIS — H02125 Mechanical ectropion of left lower eyelid: Secondary | ICD-10-CM | POA: Diagnosis not present

## 2018-07-17 ENCOUNTER — Telehealth: Payer: Self-pay | Admitting: Internal Medicine

## 2018-07-17 DIAGNOSIS — G4730 Sleep apnea, unspecified: Secondary | ICD-10-CM

## 2018-07-17 NOTE — Telephone Encounter (Signed)
Ok for referral to pulm for osa evaluation as suggested by Dr Toy Cookey, [facility]  shirron to let pt know

## 2018-07-18 NOTE — Telephone Encounter (Signed)
Pt has been informed.

## 2018-07-18 NOTE — Telephone Encounter (Signed)
Called pt, LVM, to call back to discuss.

## 2018-07-19 ENCOUNTER — Other Ambulatory Visit: Payer: Self-pay | Admitting: Internal Medicine

## 2018-07-24 ENCOUNTER — Other Ambulatory Visit: Payer: Self-pay | Admitting: Internal Medicine

## 2018-07-31 ENCOUNTER — Ambulatory Visit (INDEPENDENT_AMBULATORY_CARE_PROVIDER_SITE_OTHER): Payer: Federal, State, Local not specified - PPO

## 2018-07-31 ENCOUNTER — Ambulatory Visit (HOSPITAL_COMMUNITY)
Admission: EM | Admit: 2018-07-31 | Discharge: 2018-07-31 | Disposition: A | Discharge status: Home / Self Care | Payer: Federal, State, Local not specified - PPO | Attending: Family Medicine | Admitting: Family Medicine

## 2018-07-31 ENCOUNTER — Encounter (HOSPITAL_COMMUNITY): Payer: Self-pay | Admitting: Emergency Medicine

## 2018-07-31 DIAGNOSIS — R0981 Nasal congestion: Secondary | ICD-10-CM | POA: Diagnosis not present

## 2018-07-31 DIAGNOSIS — J22 Unspecified acute lower respiratory infection: Secondary | ICD-10-CM

## 2018-07-31 DIAGNOSIS — R05 Cough: Secondary | ICD-10-CM | POA: Diagnosis not present

## 2018-07-31 MED ORDER — AZITHROMYCIN 250 MG PO TABS: 250.0000 mg | ORAL_TABLET | Freq: Every day | ORAL | 0 refills | Status: DC

## 2018-07-31 MED ORDER — ALBUTEROL SULFATE HFA 108 (90 BASE) MCG/ACT IN AERS: 1.0000 | INHALATION_SPRAY | Freq: Four times a day (QID) | RESPIRATORY_TRACT | 0 refills | Status: DC | PRN

## 2018-07-31 MED ORDER — BENZONATATE 200 MG PO CAPS: 200.0000 mg | ORAL_CAPSULE | Freq: Three times a day (TID) | ORAL | 0 refills | Status: AC | PRN

## 2018-07-31 NOTE — ED Provider Notes (Signed)
[facility]    CSN: [id number] Arrival date & time: 07/31/18  0857     History   Chief Complaint Chief Complaint  Patient presents with  . Cough    HPI [name] is a 62 y.o. male history of DM type II, hypertension, hyperlipidemia and stable pulmonary nodule presenting today for evaluation of a cough.  Patient states that for the past week he has had a productive cough and congestion.  Hot and cold chills, especially at nighttime.  Denies rhinorrhea, ear pain, sore throat.  He has tried TheraFlu without relief.  Denies chest discomfort or shortness of breath.  Fevers have been subjective.  Had evaluation of a pulmonary nodule previously which was deemed to be stable and likely a calcified granuloma.  Patient previous smoker, quit in 1992.  HPI  Past Medical History:  Diagnosis Date  . ABNORMAL CHEST XRAY 2009  . Biceps tendon rupture   . DIABETES MELLITUS, TYPE II   . ERECTILE DYSFUNCTION, MILD   . Hyperlipidemia 07/14/2016  . HYPERTENSION   . Hypogonadism male 03/08/2014  . Hypogonadism, male   . Overweight(278.02)   . PSEUDOFOLLICULITIS BARBAE   . PULMONARY NODULE, RIGHT UPPER LOBE 02/10/2009    Patient Active Problem List   Diagnosis Date Noted  . History of colon polyps 08/24/2017  . Hyperlipidemia 07/14/2016  . Hematuria 09/06/2014  . Hypogonadism male 03/08/2014  . Myalgia and myositis, unspecified 10/22/2012  . Preventative health care 10/22/2012  . PULMONARY NODULE, RIGHT UPPER LOBE 02/10/2009  . Obesity, Class I, BMI 30-34.9 09/26/2008  . Nonspecific (abnormal) findings on radiological and other examination of body structure 07/04/2008  . Diabetes ([place]) 08/29/2007  . Erectile dysfunction associated with type 2 diabetes mellitus ([place]) 08/29/2007  . Essential hypertension 08/29/2007  . PSEUDOFOLLICULITIS BARBAE 62/83/6629    Past Surgical History:  Procedure Laterality Date  . NASAL POLYP SURGERY         Home Medications    Prior to  Admission medications   Medication Sig Start Date End Date Taking? Authorizing Provider  albuterol (PROVENTIL HFA;VENTOLIN HFA) 108 (90 Base) MCG/ACT inhaler Inhale 1-2 puffs into the lungs every 6 (six) hours as needed for wheezing or shortness of breath. 07/31/18   Wieters, Hallie C, PA-C  aspirin EC 81 MG tablet Take 1 tablet (81 mg total) by mouth daily. 07/14/16   Biagio Borg, MD  azithromycin (ZITHROMAX) 250 MG tablet Take 1 tablet (250 mg total) by mouth daily. Take first 2 tablets together, then 1 every day until finished. 07/31/18   Wieters, Hallie C, PA-C  benzonatate (TESSALON) 200 MG capsule Take 1 capsule (200 mg total) by mouth 3 (three) times daily as needed for up to 7 days for cough. 07/31/18 08/07/18  Wieters, Hallie C, PA-C  clindamycin (CLEOCIN T) 1 % external solution APPLY EXTERNALLY TO THE AFFECTED AREA TWICE DAILY 08/24/17   Biagio Borg, MD  clindamycin (CLEOCIN T) 1 % external solution APPLY EXTERNALLY TO THE AFFECTED AREA TWICE DAILY 07/19/18   Biagio Borg, MD  Dulaglutide (TRULICITY) 1.5 UT/6.5YY SOPN Inject 0.5 mLs into the skin once a week. 08/24/17   Biagio Borg, MD  glimepiride (AMARYL) 4 MG tablet TAKE 1 TABLET(4 MG) BY MOUTH DAILY WITH BREAKFAST 07/24/18   Biagio Borg, MD  HYDROcodone-acetaminophen (NORCO/VICODIN) 5-325 MG tablet Take 2 tablets by mouth every 4 (four) hours as needed. 01/25/17   Lysbeth Penner, FNP  meloxicam (MOBIC) 7.5 MG tablet TAKE  1 TABLET(7.5 MG) BY MOUTH DAILY 06/12/18   Biagio Borg, MD  metFORMIN (GLUCOPHAGE) 500 MG tablet Take 1 tablet (500 mg total) by mouth 4 (four) times daily. 08/24/17   Biagio Borg, MD  naproxen (NAPROSYN) 375 MG tablet Take 1 tablet (375 mg total) by mouth 2 (two) times daily as needed for mild pain or moderate pain (inflammation). 05/07/18   Wurst, [place], PA-C  telmisartan (MICARDIS) 40 MG tablet Take 1 tablet (40 mg total) by mouth daily. 03/29/18   Biagio Borg, MD  testosterone (ANDROGEL) 50 MG/5GM (1%) GEL Place 5  g onto the skin daily. 03/08/18   Biagio Borg, MD    Family History Family History  Problem Relation Age of Onset  . Hypertension Mother   . Diabetes Mother   . Kidney failure Father   . Cancer Brother        Pancreatic  . Coronary artery disease Neg Hx     Social History Social History   Tobacco Use  . Smoking status: Former Smoker    Last attempt to quit: 10/20/1991    Years since quitting: 26.7  . Smokeless tobacco: Never Used  Substance Use Topics  . Alcohol use: No  . Drug use: No     Allergies   Patient has no known allergies.   Review of Systems Review of Systems  Constitutional: Positive for chills and fatigue. Negative for activity change, appetite change and fever.  HENT: Positive for congestion. Negative for ear pain, rhinorrhea, sinus pressure, sore throat and trouble swallowing.   Eyes: Negative for discharge and redness.  Respiratory: Positive for cough. Negative for chest tightness and shortness of breath.   Cardiovascular: Negative for chest pain.  Gastrointestinal: Negative for abdominal pain, diarrhea, nausea and vomiting.  Musculoskeletal: Negative for myalgias.  Skin: Negative for rash.  Neurological: Negative for dizziness, light-headedness and headaches.     Physical Exam Triage Vital Signs ED Triage Vitals [07/31/18 1010]  Enc Vitals Group     BP (!) 176/101     Pulse Rate 80     Resp 16     Temp 98.3 F (36.8 C)     Temp src      SpO2 97 %     Weight      Height      Head Circumference      Peak Flow      Pain Score      Pain Loc      Pain Edu?      Excl. in [place]?    No data found.  Updated Vital Signs BP (!) 176/101   Pulse 80   Temp 98.3 F (36.8 C)   Resp 16   SpO2 97%   Visual Acuity Right Eye Distance:   Left Eye Distance:   Bilateral Distance:    Right Eye Near:   Left Eye Near:    Bilateral Near:     Physical Exam  Constitutional: He appears well-developed and well-nourished.  HENT:  Head:  Normocephalic and atraumatic.  Bilateral ears without tenderness to palpation of external auricle, tragus and mastoid, EAC's without erythema or swelling, TM's with good bony landmarks and cone of light. Non erythematous.  Nasal mucosa erythematous without rhinorrhea  Oral mucosa pink and moist, no tonsillar enlargement or exudate. Posterior pharynx patent and erythematous, no uvula deviation or swelling. Normal phonation.  Eyes: Conjunctivae are normal.  Neck: Neck supple.  Cardiovascular: Normal rate and regular rhythm.  No  murmur heard. Pulmonary/Chest: Effort normal and breath sounds normal. No respiratory distress.  Breathing comfortably at rest, CTABL, no wheezing, rales or other adventitious sounds auscultated  Abdominal: Soft. There is no tenderness.  Musculoskeletal: He exhibits no edema.  No lower extremity swelling, calf tenderness  Neurological: He is alert.  Skin: Skin is warm and dry.  Psychiatric: He has a normal mood and affect.  Nursing note and vitals reviewed.    UC Treatments / Results  Labs (all labs ordered are listed, but only abnormal results are displayed) Labs Reviewed - No data to display  EKG None  Radiology Dg Chest 2 View  Result Date: 07/31/2018 CLINICAL DATA:  62 year old male with cough for the past week and flu like symptoms EXAM: CHEST - 2 VIEW COMPARISON:  Prior chest x-ray 07/22/2008; prior CT scan of the chest 02/17/2009 FINDINGS: Stable cardiac and mediastinal contours. Atherosclerotic calcifications are present in the transverse aorta. Patchy right middle lobe airspace opacities in a peribronchovascular distribution. Mild chronic bronchitic changes. Inspiratory volumes overall are slightly low. No pneumothorax or pleural effusion. No acute osseous abnormality. IMPRESSION: 1. Increased prominence of patchy right middle lobe airspace opacities compared to prior imaging concerning for either atelectasis in the setting of acute bronchitis, or  bronchopneumonia. 2.  Aortic Atherosclerosis (ICD10-170.0). 3. Background chronic bronchitic changes. Electronically Signed   By: Jacqulynn Cadet M.D.   On: 07/31/2018 10:41    Procedures Procedures (including critical care time)  Medications Ordered in UC Medications - No data to display  Initial Impression / Assessment and Plan / UC Course  I have reviewed the triage vital signs and the nursing notes.  Pertinent labs & imaging results that were available during my care of the patient were reviewed by me and considered in my medical decision making (see chart for details).     Chest x-ray obtained given cough for 1 week as well as history of nodule.  No recent imaging of nodule.  Blood pressure is elevated, otherwise vital signs stable.  Chest x-ray showing chronic bronchitis as well as bronchopneumonia.  Will treat with azithromycin, Tessalon, and albuterol inhaler.  Patient is not tachycardic, 97% oxygen.  Blood pressure is elevated, will continue to monitor.Discussed strict return precautions. Patient verbalized understanding and is agreeable with plan.  Final Clinical Impressions(s) / UC Diagnoses   Final diagnoses:  Lower respiratory infection (e.g., bronchitis, pneumonia, pneumonitis, pulmonitis)     Discharge Instructions     X-ray showed pneumonia, also chronic bronchitis  Please begin taking azithromycin, 2 tablets today, 1 tablet for the following 4 days Please use Tessalon every 8 hours as needed for cough May use albuterol inhaler to help with any wheezing, shortness of breath or coughing attacks  Coughs occasionally linger for many weeks after being sick, please return if cough worsening, having worsening shortness of breath, difficulty breathing, chest pain, leg pain or swelling, lightheadedness or dizziness, fever, weight loss    ED Prescriptions    Medication Sig Dispense Auth. Provider   azithromycin (ZITHROMAX) 250 MG tablet Take 1 tablet (250 mg total) by  mouth daily. Take first 2 tablets together, then 1 every day until finished. 6 tablet Wieters, Hallie C, PA-C   benzonatate (TESSALON) 200 MG capsule Take 1 capsule (200 mg total) by mouth 3 (three) times daily as needed for up to 7 days for cough. 28 capsule Wieters, Hallie C, PA-C   albuterol (PROVENTIL HFA;VENTOLIN HFA) 108 (90 Base) MCG/ACT inhaler Inhale 1-2 puffs into the lungs  every 6 (six) hours as needed for wheezing or shortness of breath. 1 Inhaler Wieters, Hallie C, PA-C     Controlled Substance Prescriptions [place] Controlled Substance Registry consulted? Not Applicable   Janith Lima, [place] 07/31/18 1101

## 2018-07-31 NOTE — Discharge Instructions (Addendum)
X-ray showed pneumonia, also chronic bronchitis  Please begin taking azithromycin, 2 tablets today, 1 tablet for the following 4 days Please use Tessalon every 8 hours as needed for cough May use albuterol inhaler to help with any wheezing, shortness of breath or coughing attacks  Coughs occasionally linger for many weeks after being sick, please return if cough worsening, having worsening shortness of breath, difficulty breathing, chest pain, leg pain or swelling, lightheadedness or dizziness, fever, weight loss

## 2018-07-31 NOTE — ED Triage Notes (Signed)
Pt c/o cold symptoms x1 week cough, congestion, sweating at night, cough.

## 2018-08-14 ENCOUNTER — Telehealth: Payer: Self-pay | Admitting: Internal Medicine

## 2018-08-14 MED ORDER — CLINDAMYCIN PHOSPHATE 1 % EX SOLN: CUTANEOUS | 5 refills | Status: DC

## 2018-08-14 NOTE — Telephone Encounter (Signed)
Copied from [place] [id number]. Topic: Quick Communication - See Telephone Encounter >> Aug 14, 2018 10:34 AM Ivar Drape wrote: CRM for notification. See Telephone encounter for: 08/14/18. Patient stated he went to pick up his clindamycin (CLEOCIN T) 1 % external solution from the pharmacist but they gave him a 3oz little bottle but that will not last him for 30 days. He is instructed to apply it twice a day.   He needs a larger amount.

## 2018-08-14 NOTE — Telephone Encounter (Signed)
Done to [facility] 

## 2018-08-23 ENCOUNTER — Other Ambulatory Visit: Payer: Self-pay | Admitting: Internal Medicine

## 2018-08-31 ENCOUNTER — Other Ambulatory Visit: Payer: Self-pay | Admitting: Internal Medicine

## 2018-09-05 ENCOUNTER — Other Ambulatory Visit: Payer: Self-pay | Admitting: Internal Medicine

## 2018-09-12 DIAGNOSIS — H02125 Mechanical ectropion of left lower eyelid: Secondary | ICD-10-CM | POA: Diagnosis not present

## 2018-09-20 ENCOUNTER — Institutional Professional Consult (permissible substitution): Payer: Federal, State, Local not specified - PPO | Admitting: Pulmonary Disease

## 2018-10-04 ENCOUNTER — Telehealth: Payer: Self-pay

## 2018-10-04 DIAGNOSIS — Z0000 Encounter for general adult medical examination without abnormal findings: Secondary | ICD-10-CM

## 2018-10-04 NOTE — Telephone Encounter (Signed)
Copied from [place] [phone]. Topic: Appointment Scheduling - Scheduling Inquiry for Clinic >> Oct 04, 2018  8:13 AM Rayann Heman wrote: Reason for CRM: pt called and stated that he would like lab work done before 10/12/18. Please advise >> Oct 04, 2018  8:34 AM Morey Hummingbird wrote: Lab work in system has expired, please advise on more orders tog et before appt.

## 2018-10-12 ENCOUNTER — Encounter: Payer: Self-pay | Admitting: Internal Medicine

## 2018-10-12 ENCOUNTER — Other Ambulatory Visit (INDEPENDENT_AMBULATORY_CARE_PROVIDER_SITE_OTHER): Payer: Federal, State, Local not specified - PPO

## 2018-10-12 ENCOUNTER — Ambulatory Visit: Payer: Federal, State, Local not specified - PPO | Admitting: Internal Medicine

## 2018-10-12 VITALS — BP 138/86 | HR 84 | Temp 97.8°F | Ht 74.0 in | Wt 262.0 lb

## 2018-10-12 DIAGNOSIS — I10 Essential (primary) hypertension: Secondary | ICD-10-CM | POA: Diagnosis not present

## 2018-10-12 DIAGNOSIS — E119 Type 2 diabetes mellitus without complications: Secondary | ICD-10-CM

## 2018-10-12 DIAGNOSIS — Z0000 Encounter for general adult medical examination without abnormal findings: Secondary | ICD-10-CM

## 2018-10-12 DIAGNOSIS — E291 Testicular hypofunction: Secondary | ICD-10-CM | POA: Diagnosis not present

## 2018-10-12 LAB — LIPID PANEL
Cholesterol: 123 mg/dL (ref 0–200)
HDL: 43.6 mg/dL (ref >39.00)
LDL Cholesterol: 69 mg/dL (ref 0–99)
NonHDL: 79.78
Total CHOL/HDL Ratio: 3
Triglycerides: 56 mg/dL (ref 0.0–149.0)
VLDL: 11.2 mg/dL (ref 0.0–40.0)

## 2018-10-12 LAB — CBC WITH DIFFERENTIAL/PLATELET
Basophils Absolute: 0 10*3/uL (ref 0.0–0.1)
Basophils Relative: 0.4 % (ref 0.0–3.0)
Eosinophils Absolute: 0.2 10*3/uL (ref 0.0–0.7)
Eosinophils Relative: 2.9 % (ref 0.0–5.0)
HCT: 39.6 % (ref 39.0–52.0)
Hemoglobin: 12.8 g/dL — ABNORMAL LOW (ref 13.0–17.0)
Lymphocytes Relative: 42.1 % (ref 12.0–46.0)
Lymphs Abs: 2.5 10*3/uL (ref 0.7–4.0)
MCHC: 32.3 g/dL (ref 30.0–36.0)
MCV: 73.9 fl — ABNORMAL LOW (ref 78.0–100.0)
Monocytes Absolute: 0.5 10*3/uL (ref 0.1–1.0)
Monocytes Relative: 8 % (ref 3.0–12.0)
Neutro Abs: 2.8 10*3/uL (ref 1.4–7.7)
Neutrophils Relative %: 46.6 % (ref 43.0–77.0)
Platelets: 143 10*3/uL — ABNORMAL LOW (ref 150.0–400.0)
RBC: 5.36 Mil/uL (ref 4.22–5.81)
RDW: 14.4 % (ref 11.5–15.5)
WBC: 6 10*3/uL (ref 4.0–10.5)

## 2018-10-12 LAB — HEPATIC FUNCTION PANEL
ALT: 49 U/L (ref 0–53)
AST: 35 U/L (ref 0–37)
Albumin: 4.6 g/dL (ref 3.5–5.2)
Alkaline Phosphatase: 54 U/L (ref 39–117)
Bilirubin, Direct: 0.1 mg/dL (ref 0.0–0.3)
Total Bilirubin: 0.4 mg/dL (ref 0.2–1.2)
Total Protein: 7.8 g/dL (ref 6.0–8.3)

## 2018-10-12 LAB — BASIC METABOLIC PANEL WITH GFR
BUN: 18 mg/dL (ref 6–23)
CO2: 28 meq/L (ref 19–32)
Calcium: 9.9 mg/dL (ref 8.4–10.5)
Chloride: 104 meq/L (ref 96–112)
Creatinine, Ser: 0.81 mg/dL (ref 0.40–1.50)
GFR: 124.36 mL/min
Glucose, Bld: 179 mg/dL — ABNORMAL HIGH (ref 70–99)
Potassium: 4.4 meq/L (ref 3.5–5.1)
Sodium: 141 meq/L (ref 135–145)

## 2018-10-12 LAB — URINALYSIS, ROUTINE W REFLEX MICROSCOPIC
Bilirubin Urine: NEGATIVE
Hgb urine dipstick: NEGATIVE
Ketones, ur: NEGATIVE
Leukocytes, UA: NEGATIVE
Nitrite: NEGATIVE
RBC / HPF: NONE SEEN (ref 0–?)
Specific Gravity, Urine: 1.025 (ref 1.000–1.030)
Total Protein, Urine: NEGATIVE
Urine Glucose: 100 — AB
Urobilinogen, UA: 0.2 (ref 0.0–1.0)
WBC, UA: NONE SEEN (ref 0–?)
pH: 5.5 (ref 5.0–8.0)

## 2018-10-12 LAB — PSA: PSA: 0.69 ng/mL (ref 0.10–4.00)

## 2018-10-12 LAB — MICROALBUMIN / CREATININE URINE RATIO
Creatinine,U: 141.1 mg/dL
Microalb Creat Ratio: 1.9 mg/g (ref 0.0–30.0)
Microalb, Ur: 2.6 mg/dL — ABNORMAL HIGH (ref 0.0–1.9)

## 2018-10-12 LAB — TESTOSTERONE: Testosterone: 144.95 ng/dL — ABNORMAL LOW (ref 300.00–890.00)

## 2018-10-12 LAB — HEMOGLOBIN A1C: Hgb A1c MFr Bld: 9.9 % — ABNORMAL HIGH (ref 4.6–6.5)

## 2018-10-12 LAB — TSH: TSH: 1.59 u[IU]/mL (ref 0.35–4.50)

## 2018-10-12 MED ORDER — EMPAGLIFLOZIN 25 MG PO TABS: 25.0000 mg | ORAL_TABLET | Freq: Every day | ORAL | 3 refills | Status: DC

## 2018-10-12 NOTE — Assessment & Plan Note (Signed)
For f/u lab today 

## 2018-10-12 NOTE — Assessment & Plan Note (Addendum)
Uncontrolled o/w stable overall by history and exam, recent data reviewed with pt, and pt to continue medical treatment as before,  to f/u any worsening symptoms or concerns, to d/c the glimeparide,add jardiance and cont metformin and trulicity; for labs today

## 2018-10-12 NOTE — Addendum Note (Signed)
Addended by: Biagio Borg on: 10/12/2018 08:47 AM   Modules accepted: Orders

## 2018-10-12 NOTE — Patient Instructions (Addendum)
Please take all new medication as prescribed - the jardiance 25 mg per day  You will be contacted regarding the referral for: colonoscopy  Please continue all other medications as before, and refills have been done if requested.  Please have the pharmacy call with any other refills you may need.  Please continue your efforts at being more active, low cholesterol diet, and weight control.  You are otherwise up to date with prevention measures today.  Please keep your appointments with your specialists as you may have planned  Please go to the LAB in the Basement (turn left off the elevator) for the tests to be done today  You will be contacted by phone if any changes need to be made immediately.  Otherwise, you will receive a letter about your results with an explanation, but please check with MyChart first.  Please remember to sign up for MyChart if you have not done so, as this will be important to you in the future with finding out test results, communicating by private email, and scheduling acute appointments online when needed.  Please return in 6 months, or sooner if needed, with Lab testing done 3-5 days before

## 2018-10-12 NOTE — Progress Notes (Signed)
Subjective:    Patient ID: [name], male    DOB: December 10, 1955, 62 y.o.   MRN: [id number]  HPI  Here for wellness and f/u;  Overall doing ok;  Pt denies Chest pain, worsening SOB, DOE, wheezing, orthopnea, PND, worsening LE edema, palpitations, dizziness or syncope.  Pt denies neurological change such as new headache, facial or extremity weakness.  Pt denies polydipsia, polyuria, or low sugar symptoms. Pt states overall good compliance with treatment and medications, good tolerability, and has been trying to follow appropriate diet.  Pt denies worsening depressive symptoms, suicidal ideation or panic. No fever, night sweats, wt loss, loss of appetite, or other constitutional symptoms.  Pt states good ability with ADL's, has low fall risk, home safety reviewed and adequate, no other significant changes in hearing or vision, and only occasionally active with exercise.  Not taking the glimeparide most days with other meds in the am due to too low   Has seen Dr Loanne Drilling in past, but did not like the insulin tx.  Willing to try other orals.  CBg's still oiin 200s Past Medical History:  Diagnosis Date  . ABNORMAL CHEST XRAY 2009  . Biceps tendon rupture   . DIABETES MELLITUS, TYPE II   . ERECTILE DYSFUNCTION, MILD   . Hyperlipidemia 07/14/2016  . HYPERTENSION   . Hypogonadism male 03/08/2014  . Hypogonadism, male   . Overweight(278.02)   . PSEUDOFOLLICULITIS BARBAE   . PULMONARY NODULE, RIGHT UPPER LOBE 02/10/2009   Past Surgical History:  Procedure Laterality Date  . NASAL POLYP SURGERY      reports that he quit smoking about 26 years ago. He has never used smokeless tobacco. He reports that he does not drink alcohol or use drugs. family history includes Cancer in his brother; Diabetes in his mother; Hypertension in his mother; Kidney failure in his father. No Known Allergies Current Outpatient Medications on File Prior to Visit  Medication Sig Dispense Refill  . albuterol (PROVENTIL  HFA;VENTOLIN HFA) 108 (90 Base) MCG/ACT inhaler Inhale 1-2 puffs into the lungs every 6 (six) hours as needed for wheezing or shortness of breath. 1 Inhaler 0  . aspirin EC 81 MG tablet Take 1 tablet (81 mg total) by mouth daily. 90 tablet 11  . clindamycin (CLEOCIN T) 1 % external solution APPLY EXTERNALLY TO THE AFFECTED AREA TWICE DAILY 60 mL 5  . glimepiride (AMARYL) 4 MG tablet TAKE 1 TABLET(4 MG) BY MOUTH DAILY WITH BREAKFAST 90 tablet 0  . HYDROcodone-acetaminophen (NORCO/VICODIN) 5-325 MG tablet Take 2 tablets by mouth every 4 (four) hours as needed. 6 tablet 0  . irbesartan (AVAPRO) 150 MG tablet Take 1 tablet (150 mg total) by mouth daily. Keep Nov appt for future refills 90 tablet 0  . meloxicam (MOBIC) 7.5 MG tablet TAKE 1 TABLET(7.5 MG) BY MOUTH DAILY 90 tablet 0  . metFORMIN (GLUCOPHAGE) 500 MG tablet TAKE 1 TABLET(500 MG) BY MOUTH FOUR TIMES DAILY 360 tablet 0  . naproxen (NAPROSYN) 375 MG tablet Take 1 tablet (375 mg total) by mouth 2 (two) times daily as needed for mild pain or moderate pain (inflammation). 30 tablet 0  . telmisartan (MICARDIS) 40 MG tablet Take 1 tablet (40 mg total) by mouth daily. 90 tablet 3  . testosterone (ANDROGEL) 50 MG/5GM (1%) GEL Place 5 g onto the skin daily. 450 g 1  . TRULICITY 1.5 OB/0.9GG SOPN INJECT 0.5 ML UNDER THE SKIN ONCE PER WEEK 6 mL 0   No current facility-administered  medications on file prior to visit.    Review of Systems Constitutional: Negative for other unusual diaphoresis, sweats, appetite or weight changes HENT: Negative for other worsening hearing loss, ear pain, facial swelling, mouth sores or neck stiffness.   Eyes: Negative for other worsening pain, redness or other visual disturbance.  Respiratory: Negative for other stridor or swelling Cardiovascular: Negative for other palpitations or other chest pain  Gastrointestinal: Negative for worsening diarrhea or loose stools, blood in stool, distention or other pain Genitourinary:  Negative for hematuria, flank pain or other change in urine volume.  Musculoskeletal: Negative for myalgias or other joint swelling.  Skin: Negative for other color change, or other wound or worsening drainage.  Neurological: Negative for other syncope or numbness. Hematological: Negative for other adenopathy or swelling Psychiatric/Behavioral: Negative for hallucinations, other worsening agitation, SI, self-injury, or new decreased concentration ALl other system neg per pt    Objective:   Physical Exam BP 138/86   Pulse 84   Temp 97.8 F (36.6 C) (Oral)   Ht 6\' 2"  (1.88 m)   Wt 262 lb (118.8 kg)   SpO2 96%   BMI 33.64 kg/m  VS noted,  Constitutional: Pt is oriented to person, place, and time. Appears well-developed and well-nourished, in no significant distress and comfortable Head: Normocephalic and atraumatic  Eyes: Conjunctivae and EOM are normal. Pupils are equal, round, and reactive to light Right Ear: External ear normal without discharge Left Ear: External ear normal without discharge Nose: Nose without discharge or deformity Mouth/Throat: Oropharynx is without other ulcerations and moist  Neck: Normal range of motion. Neck supple. No JVD present. No tracheal deviation present or significant neck LA or mass Cardiovascular: Normal rate, regular rhythm, normal heart sounds and intact distal pulses.   Pulmonary/Chest: WOB normal and breath sounds without rales or wheezing  Abdominal: Soft. Bowel sounds are normal. NT. No HSM  Musculoskeletal: Normal range of motion. Exhibits no edema Lymphadenopathy: Has no other cervical adenopathy.  Neurological: Pt is alert and oriented to person, place, and time. Pt has normal reflexes. No cranial nerve deficit. Motor grossly intact, Gait intact Skin: Skin is warm and dry. No rash noted or new ulcerations Psychiatric:  Has normal mood and affect. Behavior is normal without agitation No other exam findings Lab Results  Component Value  Date   WBC 6.8 08/16/2017   HGB 14.3 08/16/2017   HCT 46.0 08/16/2017   PLT 155.0 08/16/2017   GLUCOSE 202 (H) 03/07/2018   CHOL 121 03/07/2018   TRIG 138.0 03/07/2018   HDL 39.50 03/07/2018   LDLCALC 54 03/07/2018   ALT 36 03/07/2018   AST 25 03/07/2018   NA 140 03/07/2018   K 4.1 03/07/2018   CL 104 03/07/2018   CREATININE 0.80 03/07/2018   BUN 12 03/07/2018   CO2 30 03/07/2018   TSH 1.68 08/16/2017   PSA 1.15 08/16/2017   HGBA1C 10.0 (H) 03/07/2018   MICROALBUR 5.4 (H) 08/16/2017       Assessment & Plan:

## 2018-10-12 NOTE — Assessment & Plan Note (Signed)
stable overall by history and exam, recent data reviewed with pt, and pt to continue medical treatment as before,  to f/u any worsening symptoms or concerns  

## 2018-10-12 NOTE — Assessment & Plan Note (Signed)

## 2018-10-13 ENCOUNTER — Other Ambulatory Visit: Payer: Self-pay | Admitting: Internal Medicine

## 2018-10-13 ENCOUNTER — Telehealth: Payer: Self-pay

## 2018-10-13 MED ORDER — PIOGLITAZONE HCL 15 MG PO TABS: 15.0000 mg | ORAL_TABLET | Freq: Every day | ORAL | 3 refills | Status: DC

## 2018-10-13 NOTE — Telephone Encounter (Signed)
-----   Message from Biagio Borg, MD sent at 10/13/2018 12:10 PM EST ----- Left message on MyChart, pt to cont same tx except  The test results show that your current treatment is OK, except the A1c is better but still too high.  We added the Jardiance at your visit, but as this only works so much, and you are not wanting insulin, we can start generic Actos 15 mg per day.  I will send the prescription, and you should hear from the office as well    [place] to please inform pt, I will do rx

## 2018-10-13 NOTE — Telephone Encounter (Signed)
Pt has been informed of results and expressed understanding.  Pt would like to know what he can do about his lower testosterone level. Please advise.

## 2018-10-16 NOTE — Telephone Encounter (Signed)
Ok to continue the current dose androgel as higher dose can lead to enlarged prostate

## 2018-10-17 NOTE — Telephone Encounter (Signed)
Pt has been informed of results and expressed understanding.  °

## 2018-11-17 DIAGNOSIS — Z0279 Encounter for issue of other medical certificate: Secondary | ICD-10-CM

## 2018-11-26 ENCOUNTER — Other Ambulatory Visit: Payer: Self-pay | Admitting: Internal Medicine

## 2018-12-12 ENCOUNTER — Encounter: Payer: Self-pay | Admitting: Gastroenterology

## 2018-12-18 ENCOUNTER — Ambulatory Visit (INDEPENDENT_AMBULATORY_CARE_PROVIDER_SITE_OTHER): Payer: Federal, State, Local not specified - PPO | Admitting: Internal Medicine

## 2018-12-18 ENCOUNTER — Encounter: Payer: Self-pay | Admitting: Internal Medicine

## 2018-12-18 VITALS — BP 128/82 | HR 78 | Ht 74.0 in | Wt 264.0 lb

## 2018-12-18 DIAGNOSIS — I10 Essential (primary) hypertension: Secondary | ICD-10-CM | POA: Diagnosis not present

## 2018-12-18 DIAGNOSIS — G4733 Obstructive sleep apnea (adult) (pediatric): Secondary | ICD-10-CM | POA: Diagnosis not present

## 2018-12-18 NOTE — Progress Notes (Signed)
12/18/2018-63 year old male former smoker for sleep evaluation. -----Sleep Consult: Dr. Cathlean Cower. Never had sleep study. ? sleep disorder Medical problem list includes HBP, right upper lobe lung nodule, DM 2, obesity, hyperlipidemia  Epworth:5 Body weight today 264 pounds Married, working as a Forensic scientist for [employer] He had blepharoplasty surgery and the eye doctor questioned whether he might have sleep apnea, possibly because he prefers to sleep on his sides (?).  His wife does not tell him that he snores significantly and he minimizes daytime sleepiness.  Denies sleep medicines or caffeine.  Denies parasomnias.  Very sleepy at bedtime, waking once or twice during the night for bathroom with bedtime around 10 PM and up around 5 AM. ENT-nasal polypectomy in [place] but denies history of significant allergy.  Has full dentures.  Prior to Admission medications   Medication Sig Start Date End Date Taking? Authorizing Provider  clindamycin (CLEOCIN T) 1 % external solution APPLY EXTERNALLY TO THE AFFECTED AREA TWICE DAILY 08/14/18  Yes Biagio Borg, MD  glimepiride (AMARYL) 4 MG tablet Take 1 tablet by mouth daily. 12/01/18  Yes [provider]  HYDROcodone-acetaminophen (NORCO/VICODIN) 5-325 MG tablet Take 2 tablets by mouth every 4 (four) hours as needed. 01/25/17  Yes Lysbeth Penner, FNP  irbesartan (AVAPRO) 150 MG tablet TAKE 1 TABLET BY MOUTH EVERY DAY 11/27/18  Yes Biagio Borg, MD  metFORMIN (GLUCOPHAGE) 500 MG tablet TAKE 1 TABLET(500 MG) BY MOUTH FOUR TIMES DAILY 11/27/18  Yes Biagio Borg, MD  testosterone (ANDROGEL) 50 MG/5GM (1%) GEL Place 5 g onto the skin daily. 03/08/18  Yes Biagio Borg, MD  albuterol (PROVENTIL HFA;VENTOLIN HFA) 108 (90 Base) MCG/ACT inhaler Inhale 1-2 puffs into the lungs every 6 (six) hours as needed for wheezing or shortness of breath. Patient not taking: Reported on 12/18/2018 07/31/18   Wieters, Madelynn Done C, PA-C  TRULICITY 1.5 DD/2.2GU SOPN INJECT 0.5 ML UNDER  THE SKIN ONCE PER WEEK Patient not taking: Reported on 12/18/2018 09/05/18   Biagio Borg, MD   Past Medical History:  Diagnosis Date  . ABNORMAL CHEST XRAY 2009  . Biceps tendon rupture   . DIABETES MELLITUS, TYPE II   . ERECTILE DYSFUNCTION, MILD   . Hyperlipidemia 07/14/2016  . HYPERTENSION   . Hypogonadism male 03/08/2014  . Hypogonadism, male   . Overweight(278.02)   . PSEUDOFOLLICULITIS BARBAE   . PULMONARY NODULE, RIGHT UPPER LOBE 02/10/2009   Past Surgical History:  Procedure Laterality Date  . NASAL POLYP SURGERY     Family History  Problem Relation Age of Onset  . Hypertension Mother   . Diabetes Mother   . Kidney failure Father   . Cancer Brother        Pancreatic  . Coronary artery disease Neg Hx    Social History   Socioeconomic History  . Marital status: Married    Spouse name: Not on file  . Number of children: 2  . Years of education: Not on file  . Highest education level: Not on file  Occupational History  . Occupation: WORKS [employer]    Employer: [employer]  Social Needs  . Financial resource strain: Not on file  . Food insecurity:    Worry: Not on file    Inability: Not on file  . Transportation needs:    Medical: Not on file    Non-medical: Not on file  Tobacco Use  . Smoking status: Former Smoker    Last attempt to quit:  10/20/1991    Years since quitting: 27.1  . Smokeless tobacco: Never Used  Substance and Sexual Activity  . Alcohol use: No  . Drug use: No  . Sexual activity: Yes    Partners: Female  Lifestyle  . Physical activity:    Days per week: Not on file    Minutes per session: Not on file  . Stress: Not on file  Relationships  . Social connections:    Talks on phone: Not on file    Gets together: Not on file    Attends religious service: Not on file    Active member of club or organization: Not on file    Attends meetings of clubs or organizations: Not on file    Relationship status: Not on file  .  Intimate partner violence:    Fear of current or ex partner: Not on file    Emotionally abused: Not on file    Physically abused: Not on file    Forced sexual activity: Not on file  Other Topics Concern  . 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 Married 7 years, single 7 years, married 13 years (1995). 2 daughters- '80, '84- 2 grandchildren-'99,  '06      Regular exercise: walk 3-4 x a week; lifting light weights   Caffeine use: none   ROS-see HPI   + = positive Constitutional:    weight loss, night sweats, fevers, chills, fatigue, lassitude. HEENT:    headaches, difficulty swallowing, tooth/dental problems, sore throat,       sneezing, itching, ear ache, nasal congestion, post nasal drip, snoring CV:    chest pain, orthopnea, PND, swelling in lower extremities, anasarca,                                  dizziness, palpitations Resp:   shortness of breath with exertion or at rest.                productive cough,   non-productive cough, coughing up of blood.              change in color of mucus.  wheezing.   Skin:    rash or lesions. GI:  No-   heartburn, indigestion, abdominal pain, nausea, vomiting, diarrhea,                 change in bowel habits, loss of appetite GU: dysuria, change in color of urine, no urgency or frequency.   flank pain. MS:   joint pain, stiffness, decreased range of motion, back pain. Neuro-     nothing unusual Psych:  change in mood or affect.  depression or anxiety.   memory loss.  OBJ- Physical Exam General- Alert, Oriented, Affect-appropriate, Distress- none acute Skin- rash-none, lesions- none, excoriation- none Lymphadenopathy- none Head- atraumatic            Eyes- Gross vision intact, PERRLA, conjunctivae and secretions clear            Ears- Hearing, canals-normal            Nose- Clear, no-Septal dev, mucus, polyps, erosion, perforation             Throat- Mallampati III-IV , mucosa clear ,  drainage- none, tonsils- atrophic, +full dentures Neck- flexible , trachea midline, no stridor , thyroid nl, carotid no bruit Chest - symmetrical  excursion , unlabored           Heart/CV- RRR , no murmur , no gallop  , no rub, nl s1 s2                           - JVD- none , edema- none, stasis changes- none, varices- none           Lung- clear to P&A, wheeze- none, cough- none , dullness-none, rub- none           Chest wall-  Abd-  Br/ Gen/ Rectal- Not done, not indicated Extrem- cyanosis- none, clubbing, none, atrophy- none, strength- nl Neuro- grossly intact to observation

## 2018-12-18 NOTE — Assessment & Plan Note (Signed)
Tentative diagnosis.  He minimizes symptoms but physical exam would be consistent.  Appropriate discussion, education, questions answered. Plan-Home sleep test.  CPAP if appropriate.

## 2018-12-18 NOTE — Patient Instructions (Addendum)
Order- please schedule unattended home sleep test   Dx OSA  Please call me for results and recommendations about 2 weeks after your sleep test. If appropriate, we may be able to get started with treatment before I see you next.

## 2018-12-18 NOTE — Assessment & Plan Note (Signed)
I compared untreated sleep apnea to untreated hypertension.

## 2018-12-21 ENCOUNTER — Ambulatory Visit: Payer: Federal, State, Local not specified - PPO | Admitting: Internal Medicine

## 2018-12-21 ENCOUNTER — Encounter: Payer: Self-pay | Admitting: Internal Medicine

## 2018-12-21 VITALS — BP 162/94 | HR 83 | Temp 98.0°F | Ht 74.0 in | Wt 261.0 lb

## 2018-12-21 DIAGNOSIS — H9193 Unspecified hearing loss, bilateral: Secondary | ICD-10-CM | POA: Diagnosis not present

## 2018-12-21 DIAGNOSIS — E785 Hyperlipidemia, unspecified: Secondary | ICD-10-CM

## 2018-12-21 DIAGNOSIS — I10 Essential (primary) hypertension: Secondary | ICD-10-CM | POA: Diagnosis not present

## 2018-12-21 DIAGNOSIS — E119 Type 2 diabetes mellitus without complications: Secondary | ICD-10-CM

## 2018-12-21 DIAGNOSIS — H9190 Unspecified hearing loss, unspecified ear: Secondary | ICD-10-CM | POA: Insufficient documentation

## 2018-12-21 MED ORDER — PIOGLITAZONE HCL 30 MG PO TABS: 30.0000 mg | ORAL_TABLET | Freq: Every day | ORAL | 3 refills | Status: DC

## 2018-12-21 NOTE — Assessment & Plan Note (Signed)
stable overall by history and exam, recent data reviewed with pt, and pt to continue medical treatment as before,  to f/u any worsening symptoms or concerns  

## 2018-12-21 NOTE — Assessment & Plan Note (Signed)
Bilateral , moderate by audiology, ok for letter as pt reqeusts

## 2018-12-21 NOTE — Assessment & Plan Note (Signed)
Uncontrolled, to add actos 30 mg per day,  to f/u any worsening symptoms or concerns, cont diet

## 2018-12-21 NOTE — Patient Instructions (Signed)
You are given the signed form today regarding hearing loss and tinnitus to the [place]  Please take all new medication as prescribed- the actos 30 mg per day  Please continue all other medications as before, and refills have been done if requested.  Please have the pharmacy call with any other refills you may need.  Please continue your efforts at being more active, low cholesterol diet, and weight control.  Please keep your appointments with your specialists as you may have planned

## 2018-12-21 NOTE — Progress Notes (Signed)
Subjective:    Patient ID: [name], male    DOB: 21-Jun-1956, 63 y.o.   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 Here to f/u; overall doing ok,  Pt denies chest pain, increasing sob or doe, wheezing, orthopnea, PND, increased LE swelling, palpitations, dizziness or syncope.  Pt denies new neurological symptoms such as new headache, or facial or extremity weakness or numbness.  Pt denies polydipsia, polyuria, or low sugar episode.  Pt states overall good compliance with meds, mostly trying to follow appropriate diet, with wt overall stable,  but little exercise however.  Has not yet started the actos  No new complaints Past Medical History:  Diagnosis Date  . ABNORMAL CHEST XRAY 2009  . Biceps tendon rupture   . DIABETES MELLITUS, TYPE II   . ERECTILE DYSFUNCTION, MILD   . Hyperlipidemia 07/14/2016  . HYPERTENSION   . Hypogonadism male 03/08/2014  . Hypogonadism, male   . Overweight(278.02)   . PSEUDOFOLLICULITIS BARBAE   . PULMONARY NODULE, RIGHT UPPER LOBE 02/10/2009   Past Surgical History:  Procedure Laterality Date  . NASAL POLYP SURGERY      reports that he quit smoking about 27 years ago. He has never used smokeless tobacco. He reports that he does not drink alcohol or use drugs. family history includes Cancer in his brother; Diabetes in his mother; Hypertension in his mother; Kidney failure in his father. No Known Allergies Current Outpatient Medications on File Prior to Visit  Medication Sig Dispense Refill  . albuterol (PROVENTIL HFA;VENTOLIN HFA) 108 (90 Base) MCG/ACT inhaler Inhale 1-2 puffs into the lungs every 6 (six) hours as needed for wheezing or shortness of breath. 1 Inhaler 0  . clindamycin (CLEOCIN T) 1 % external solution APPLY EXTERNALLY TO THE AFFECTED AREA TWICE DAILY 60  mL 5  . glimepiride (AMARYL) 4 MG tablet Take 1 tablet by mouth daily.    Marland Kitchen HYDROcodone-acetaminophen (NORCO/VICODIN) 5-325 MG tablet Take 2 tablets by mouth every 4 (four) hours as needed. 6 tablet 0  . irbesartan (AVAPRO) 150 MG tablet TAKE 1 TABLET BY MOUTH EVERY DAY 90 tablet 1  . metFORMIN (GLUCOPHAGE) 500 MG tablet TAKE 1 TABLET(500 MG) BY MOUTH FOUR TIMES DAILY 360 tablet 1  . testosterone (ANDROGEL) 50 MG/5GM (1%) GEL Place 5 g onto the skin daily. 450 g 1  . TRULICITY 1.5 TI/4.5YK SOPN INJECT 0.5 ML UNDER THE SKIN ONCE PER WEEK 6 mL 0   No current facility-administered medications on file prior to visit.    Review of Systems  Constitutional: Negative for other unusual diaphoresis or sweats HENT: Negative for ear discharge or swelling Eyes: Negative for other worsening visual disturbances Respiratory: Negative for stridor or other swelling  Gastrointestinal: Negative for worsening distension or other blood Genitourinary: Negative for retention or other urinary change Musculoskeletal: Negative for other MSK pain or swelling Skin: Negative for color change or other new lesions Neurological: Negative for worsening tremors and other numbness  Psychiatric/Behavioral: Negative for worsening agitation or other fatigue All other system neg per pt    Objective:   Physical Exam BP (!) 162/94 Comment: meds taken 60mins ao  Pulse 83   Temp 98 F (36.7 C) (Oral)   Ht 6\' 2"  (1.88 m)   Wt 261 lb (  118.4 kg)   SpO2 97%   BMI 33.51 kg/m  VS noted,  Constitutional: Pt appears in NAD HENT: Head: NCAT.  Right Ear: External ear normal.  Left Ear: External ear normal.  Eyes: . Pupils are equal, round, and reactive to light. Conjunctivae and EOM are normal Nose: without d/c or deformity Neck: Neck supple. Gross normal ROM Cardiovascular: Normal rate and regular rhythm.   Pulmonary/Chest: Effort normal and breath sounds without rales or wheezing.  Abd:  Soft, NT, ND, + BS, no  organomegaly Neurological: Pt is alert. At baseline orientation, motor grossly intact Skin: Skin is warm. No rashes, other new lesions, no LE edema Psychiatric: Pt behavior is normal without agitation  No other exam findings Lab Results  Component Value Date   WBC 6.0 10/12/2018   HGB 12.8 (L) 10/12/2018   HCT 39.6 10/12/2018   PLT 143.0 (L) 10/12/2018   GLUCOSE 179 (H) 10/12/2018   CHOL 123 10/12/2018   TRIG 56.0 10/12/2018   HDL 43.60 10/12/2018   LDLCALC 69 10/12/2018   ALT 49 10/12/2018   AST 35 10/12/2018   NA 141 10/12/2018   K 4.4 10/12/2018   CL 104 10/12/2018   CREATININE 0.81 10/12/2018   BUN 18 10/12/2018   CO2 28 10/12/2018   TSH 1.59 10/12/2018   PSA 0.69 10/12/2018   HGBA1C 9.9 (H) 10/12/2018   MICROALBUR 2.6 (H) 10/12/2018       Assessment & Plan:

## 2018-12-30 DIAGNOSIS — E119 Type 2 diabetes mellitus without complications: Secondary | ICD-10-CM | POA: Diagnosis not present

## 2019-01-01 ENCOUNTER — Other Ambulatory Visit: Payer: Self-pay | Admitting: Internal Medicine

## 2019-01-01 NOTE — Telephone Encounter (Signed)
Done erx to the [facility] in [place] as this was the one that requested the refill and we had to send it there

## 2019-01-01 NOTE — Telephone Encounter (Signed)
Pt called stating he needs testosterone to go to [facility] in [place].   testosterone (ANDROGEL) 50 MG/5GM (1%) GEL   [facility] [place], [address] AT [address] [phone] (Phone) [phone] (Fax)

## 2019-01-02 ENCOUNTER — Encounter: Payer: Self-pay | Admitting: Gastroenterology

## 2019-01-02 ENCOUNTER — Ambulatory Visit (AMBULATORY_SURGERY_CENTER): Payer: Self-pay | Admitting: *Deleted

## 2019-01-02 ENCOUNTER — Other Ambulatory Visit: Payer: Self-pay

## 2019-01-02 VITALS — Ht 72.5 in | Wt 261.2 lb

## 2019-01-02 DIAGNOSIS — Z1211 Encounter for screening for malignant neoplasm of colon: Secondary | ICD-10-CM

## 2019-01-02 MED ORDER — PEG 3350-KCL-NABCB-NACL-NASULF 236 G PO SOLR: 4000.0000 mL | Freq: Once | ORAL | 0 refills | Status: AC

## 2019-01-02 NOTE — Progress Notes (Signed)
Patient denies any allergies to egg or soy products. Patient denies complications with anesthesia/sedation.  Patient denies oxygen use at home and denies diet medications. Pamphlet given on colonoscopy. 

## 2019-01-04 ENCOUNTER — Telehealth: Payer: Self-pay

## 2019-01-04 DIAGNOSIS — L989 Disorder of the skin and subcutaneous tissue, unspecified: Secondary | ICD-10-CM | POA: Diagnosis not present

## 2019-01-04 DIAGNOSIS — L570 Actinic keratosis: Secondary | ICD-10-CM | POA: Diagnosis not present

## 2019-01-04 DIAGNOSIS — L918 Other hypertrophic disorders of the skin: Secondary | ICD-10-CM | POA: Diagnosis not present

## 2019-01-04 NOTE — Telephone Encounter (Signed)
Per [employer] FEP medication is approved 12/05/18-01/04/2020.  Determination sent to scan.

## 2019-01-09 DIAGNOSIS — G4733 Obstructive sleep apnea (adult) (pediatric): Secondary | ICD-10-CM

## 2019-01-15 ENCOUNTER — Ambulatory Visit (AMBULATORY_SURGERY_CENTER): Payer: Federal, State, Local not specified - PPO | Admitting: Gastroenterology

## 2019-01-15 ENCOUNTER — Encounter: Payer: Self-pay | Admitting: Gastroenterology

## 2019-01-15 VITALS — BP 160/89 | HR 70 | Temp 96.9°F | Resp 14 | Ht 72.0 in | Wt 261.0 lb

## 2019-01-15 DIAGNOSIS — Z1211 Encounter for screening for malignant neoplasm of colon: Secondary | ICD-10-CM | POA: Diagnosis not present

## 2019-01-15 DIAGNOSIS — D125 Benign neoplasm of sigmoid colon: Secondary | ICD-10-CM

## 2019-01-15 DIAGNOSIS — K635 Polyp of colon: Secondary | ICD-10-CM | POA: Diagnosis not present

## 2019-01-15 DIAGNOSIS — K649 Unspecified hemorrhoids: Secondary | ICD-10-CM

## 2019-01-15 DIAGNOSIS — G4733 Obstructive sleep apnea (adult) (pediatric): Secondary | ICD-10-CM | POA: Diagnosis not present

## 2019-01-15 MED ORDER — SODIUM CHLORIDE 0.9 % IV SOLN: 500.0000 mL | Freq: Once | INTRAVENOUS | Status: DC

## 2019-01-15 NOTE — Progress Notes (Signed)
To PACU, VSS. Report to [email or web address] 

## 2019-01-15 NOTE — Progress Notes (Signed)
Called to room to assist during endoscopic procedure.  Patient ID and intended procedure confirmed with present staff. Received instructions for my participation in the procedure from the performing physician.  

## 2019-01-15 NOTE — Progress Notes (Signed)
Pt. Reports no change in her medical or surgical history since her pre-visit 01/02/2019.

## 2019-01-15 NOTE — Op Note (Signed)
[place] Patient Name: [name] Procedure Date: 01/15/2019 10:45 AM MRN: [id number] Endoscopist: Milus Banister , MD Age: 63 Referring MD:  Date of Birth: [birth year] Gender: Male Account #: [id number] Procedure:                Colonoscopy Indications:              Screening for colorectal malignant neoplasm Medicines:                Monitored Anesthesia Care Procedure:                Pre-Anesthesia Assessment:                           - Prior to the procedure, a History and Physical                            was performed, and patient medications and                            allergies were reviewed. The patient's tolerance of                            previous anesthesia was also reviewed. The risks                            and benefits of the procedure and the sedation                            options and risks were discussed with the patient.                            All questions were answered, and informed consent                            was obtained. Prior Anticoagulants: The patient has                            taken no previous anticoagulant or antiplatelet                            agents. ASA Grade Assessment: II - A patient with                            mild systemic disease. After reviewing the risks                            and benefits, the patient was deemed in                            satisfactory condition to undergo the procedure.                           After obtaining informed consent, the colonoscope  was passed under direct vision. Throughout the                            procedure, the patient's blood pressure, pulse, and                            oxygen saturations were monitored continuously. The                            Colonoscope was introduced through the anus and                            advanced to the the cecum, identified by                            appendiceal orifice and  ileocecal valve. The                            colonoscopy was performed without difficulty. The                            patient tolerated the procedure well. The quality                            of the bowel preparation was good. The ileocecal                            valve, appendiceal orifice, and rectum were                            photographed. Scope In: 11:23:12 AM Scope Out: 11:36:35 AM Scope Withdrawal Time: 0 hours 10 minutes 22 seconds  Total Procedure Duration: 0 hours 13 minutes 23 seconds  Findings:                 A 2 mm polyp was found in the sigmoid colon. The                            polyp was sessile. The polyp was removed with a                            cold snare. Resection and retrieval were complete.                           Internal hemorrhoids were found. The hemorrhoids                            were medium-sized.                           The exam was otherwise without abnormality on                            direct and retroflexion views. Complications:  No immediate complications. Estimated blood loss:                            None. Estimated Blood Loss:     Estimated blood loss: none. Impression:               - One 2 mm polyp in the sigmoid colon, removed with                            a cold snare. Resected and retrieved.                           - Internal hemorrhoids.                           - The examination was otherwise normal on direct                            and retroflexion views. Recommendation:           - Patient has a contact number available for                            emergencies. The signs and symptoms of potential                            delayed complications were discussed with the                            patient. Return to normal activities tomorrow.                            Written discharge instructions were provided to the                            patient.                           -  Resume previous diet.                           - Continue present medications.                           You will receive a letter within 2-3 weeks with the                            pathology results and my final recommendations.                           If the polyp(s) is proven to be 'pre-cancerous' on                            pathology, you will need repeat colonoscopy in 5  years. If the polyp(s) is NOT 'precancerous' on                            pathology then you should repeat colon cancer                            screening in 10 years with colonoscopy without need                            for colon cancer screening by any method prior to                            then (including stool testing). Milus Banister, MD 01/15/2019 11:38:29 AM This report has been signed electronically.

## 2019-01-15 NOTE — Patient Instructions (Signed)
YOU HAD AN ENDOSCOPIC PROCEDURE TODAY AT [facility]:   Refer to the procedure report that was given to you for any specific questions about what was found during the examination.  If the procedure report does not answer your questions, please call your gastroenterologist to clarify.  If you requested that your care partner not be given the details of your procedure findings, then the procedure report has been included in a sealed envelope for you to review at your convenience later.  YOU SHOULD EXPECT: Some feelings of bloating in the abdomen. Passage of more gas than usual.  Walking can help get rid of the air that was put into your GI tract during the procedure and reduce the bloating. If you had a lower endoscopy (such as a colonoscopy or flexible sigmoidoscopy) you may notice spotting of blood in your stool or on the toilet paper. If you underwent a bowel prep for your procedure, you may not have a normal bowel movement for a few days.  Please Note:  You might notice some irritation and congestion in your nose or some drainage.  This is from the oxygen used during your procedure.  There is no need for concern and it should clear up in a day or so.  SYMPTOMS TO REPORT IMMEDIATELY:   Following lower endoscopy (colonoscopy or flexible sigmoidoscopy):  Excessive amounts of blood in the stool  Significant tenderness or worsening of abdominal pains  Swelling of the abdomen that is new, acute  Fever of 100F or higher  For urgent or emergent issues, a gastroenterologist can be reached at any hour by calling [phone].   DIET:  We do recommend a small meal at first, but then you may proceed to your regular diet.  Drink plenty of fluids but you should avoid alcoholic beverages for 24 hours.  ACTIVITY:  You should plan to take it easy for the rest of today and you should NOT DRIVE or use heavy machinery until tomorrow (because of the sedation medicines used during the test).     FOLLOW UP: Our staff will call the number listed on your records the next business day following your procedure to check on you and address any questions or concerns that you may have regarding the information given to you following your procedure. If we do not reach you, we will leave a message.  However, if you are feeling well and you are not experiencing any problems, there is no need to return our call.  We will assume that you have returned to your regular daily activities without incident.  If any biopsies were taken you will be contacted by phone or by letter within the next 1-3 weeks.  Please call us at [phone] if you have not heard about the biopsies in 3 weeks.   Await for biopsy results Hemorrhoids (handout given) Polyps (handout given)  SIGNATURES/CONFIDENTIALITY: You and/or your care partner have signed paperwork which will be entered into your electronic medical record.  These signatures attest to the fact that that the information above on your After Visit Summary has been reviewed and is understood.  Full responsibility of the confidentiality of this discharge information lies with you and/or your care-partner.

## 2019-01-16 ENCOUNTER — Telehealth: Payer: Self-pay | Admitting: *Deleted

## 2019-01-16 ENCOUNTER — Telehealth: Payer: Self-pay

## 2019-01-16 DIAGNOSIS — Z1389 Encounter for screening for other disorder: Secondary | ICD-10-CM | POA: Diagnosis not present

## 2019-01-16 DIAGNOSIS — Z125 Encounter for screening for malignant neoplasm of prostate: Secondary | ICD-10-CM | POA: Diagnosis not present

## 2019-01-16 DIAGNOSIS — E291 Testicular hypofunction: Secondary | ICD-10-CM | POA: Diagnosis not present

## 2019-01-16 NOTE — Telephone Encounter (Signed)
  Follow up Call-  Call back number 01/15/2019  Post procedure Call Back phone  # [phone]  Permission to leave phone message Yes  Some recent data might be hidden     Left message

## 2019-01-16 NOTE — Telephone Encounter (Signed)
  Follow up Call-  Call back number 01/15/2019  Post procedure Call Back phone  # [phone]  Permission to leave phone message Yes  Some recent data might be hidden     Patient questions:  Do you have a fever, pain , or abdominal swelling? No. Pain Score  0 *  Have you tolerated food without any problems? Yes.    Have you been able to return to your normal activities? Yes.    Do you have any questions about your discharge instructions: Diet   No. Medications  No. Follow up visit  No.  Do you have questions or concerns about your Care? No.  Actions: * If pain score is 4 or above: No action needed, pain <4.

## 2019-01-18 ENCOUNTER — Encounter: Payer: Self-pay | Admitting: Gastroenterology

## 2019-01-26 ENCOUNTER — Telehealth: Payer: Self-pay | Admitting: Internal Medicine

## 2019-01-26 DIAGNOSIS — G4733 Obstructive sleep apnea (adult) (pediatric): Secondary | ICD-10-CM

## 2019-01-26 NOTE — Telephone Encounter (Signed)
lmtcb x1 for pt. 

## 2019-01-29 NOTE — Telephone Encounter (Signed)
Returned call to patient, made aware of HST results. Voiced understanding. Okay to place order for cpap per patient. F/U appt made. Nothing further is needed at this time.

## 2019-01-29 NOTE — Telephone Encounter (Signed)
Home sleep test showed obstructive sleep apnea, averaging 12 apneas/ hour,with drops in blood oxygen level.  I recommend we order new DME, new CPAP auto 5-20, mask of choice, humidifier, supplies, AirView  Please make sure he has a return ov in 31-90 days for CPAP f/u, per insurance regs.

## 2019-01-29 NOTE — Telephone Encounter (Signed)
Pt is calling back [phone]

## 2019-01-29 NOTE — Addendum Note (Signed)
Addended by: Vivia Ewing on: 01/29/2019 12:28 PM   Modules accepted: Orders

## 2019-01-29 NOTE — Telephone Encounter (Signed)
Dr. Annamaria Boots please review sleep study. It is scanned int he media tab.

## 2019-02-08 DIAGNOSIS — H11422 Conjunctival edema, left eye: Secondary | ICD-10-CM | POA: Diagnosis not present

## 2019-02-16 DIAGNOSIS — Z0289 Encounter for other administrative examinations: Secondary | ICD-10-CM | POA: Diagnosis not present

## 2019-02-26 ENCOUNTER — Other Ambulatory Visit: Payer: Self-pay | Admitting: Internal Medicine

## 2019-04-04 ENCOUNTER — Other Ambulatory Visit: Payer: Self-pay | Admitting: Internal Medicine

## 2019-04-12 ENCOUNTER — Telehealth: Payer: Self-pay | Admitting: Internal Medicine

## 2019-04-12 NOTE — Telephone Encounter (Signed)
Feel free to reschedule. Please make patient aware that its not a guarantee that it will be an in office given visit with the current pandemic circumstances.

## 2019-04-12 NOTE — Telephone Encounter (Signed)
Noted  

## 2019-04-12 NOTE — Telephone Encounter (Signed)
Patient would like to reschedule his 6 month fu.  He is requesting this appt to be an in office visit.  Please advise.

## 2019-04-12 NOTE — Telephone Encounter (Signed)
Left patient vm to call back in regard.  °

## 2019-04-13 ENCOUNTER — Ambulatory Visit: Payer: Federal, State, Local not specified - PPO | Admitting: Internal Medicine

## 2019-04-16 ENCOUNTER — Ambulatory Visit: Payer: Federal, State, Local not specified - PPO | Admitting: Internal Medicine

## 2019-04-17 DIAGNOSIS — E119 Type 2 diabetes mellitus without complications: Secondary | ICD-10-CM | POA: Diagnosis not present

## 2019-04-17 DIAGNOSIS — I10 Essential (primary) hypertension: Secondary | ICD-10-CM | POA: Diagnosis not present

## 2019-04-20 ENCOUNTER — Other Ambulatory Visit: Payer: Self-pay

## 2019-04-20 ENCOUNTER — Ambulatory Visit (INDEPENDENT_AMBULATORY_CARE_PROVIDER_SITE_OTHER): Payer: Federal, State, Local not specified - PPO | Admitting: Internal Medicine

## 2019-04-20 ENCOUNTER — Encounter: Payer: Self-pay | Admitting: Internal Medicine

## 2019-04-20 ENCOUNTER — Telehealth: Payer: Self-pay

## 2019-04-20 ENCOUNTER — Other Ambulatory Visit (INDEPENDENT_AMBULATORY_CARE_PROVIDER_SITE_OTHER): Payer: Federal, State, Local not specified - PPO

## 2019-04-20 VITALS — BP 126/86 | HR 86 | Temp 98.1°F | Ht 72.0 in | Wt 268.0 lb

## 2019-04-20 DIAGNOSIS — E611 Iron deficiency: Secondary | ICD-10-CM

## 2019-04-20 DIAGNOSIS — E119 Type 2 diabetes mellitus without complications: Secondary | ICD-10-CM

## 2019-04-20 DIAGNOSIS — E291 Testicular hypofunction: Secondary | ICD-10-CM

## 2019-04-20 DIAGNOSIS — E559 Vitamin D deficiency, unspecified: Secondary | ICD-10-CM | POA: Diagnosis not present

## 2019-04-20 DIAGNOSIS — Z0000 Encounter for general adult medical examination without abnormal findings: Secondary | ICD-10-CM

## 2019-04-20 DIAGNOSIS — I10 Essential (primary) hypertension: Secondary | ICD-10-CM | POA: Diagnosis not present

## 2019-04-20 DIAGNOSIS — E538 Deficiency of other specified B group vitamins: Secondary | ICD-10-CM

## 2019-04-20 DIAGNOSIS — E785 Hyperlipidemia, unspecified: Secondary | ICD-10-CM | POA: Diagnosis not present

## 2019-04-20 LAB — HEMOGLOBIN A1C: Hgb A1c MFr Bld: 10 % — ABNORMAL HIGH (ref 4.6–6.5)

## 2019-04-20 LAB — TESTOSTERONE: Testosterone: 150.68 ng/dL — ABNORMAL LOW (ref 300.00–890.00)

## 2019-04-20 LAB — LIPID PANEL
Cholesterol: 126 mg/dL (ref 0–200)
HDL: 39.6 mg/dL (ref >39.00)
LDL Cholesterol: 69 mg/dL (ref 0–99)
NonHDL: 85.92
Total CHOL/HDL Ratio: 3
Triglycerides: 83 mg/dL (ref 0.0–149.0)
VLDL: 16.6 mg/dL (ref 0.0–40.0)

## 2019-04-20 LAB — BASIC METABOLIC PANEL
BUN: 14 mg/dL (ref 6–23)
CO2: 27 mEq/L (ref 19–32)
Calcium: 9.8 mg/dL (ref 8.4–10.5)
Chloride: 105 mEq/L (ref 96–112)
Creatinine, Ser: 0.91 mg/dL (ref 0.40–1.50)
GFR: 102.13 mL/min (ref >60.00)
Glucose, Bld: 168 mg/dL — ABNORMAL HIGH (ref 70–99)
Potassium: 4.4 mEq/L (ref 3.5–5.1)
Sodium: 141 mEq/L (ref 135–145)

## 2019-04-20 LAB — VITAMIN B12: Vitamin B-12: 303 pg/mL (ref 211–911)

## 2019-04-20 LAB — HEPATIC FUNCTION PANEL
ALT: 50 U/L (ref 0–53)
AST: 39 U/L — ABNORMAL HIGH (ref 0–37)
Albumin: 4.5 g/dL (ref 3.5–5.2)
Alkaline Phosphatase: 58 U/L (ref 39–117)
Bilirubin, Direct: 0.1 mg/dL (ref 0.0–0.3)
Total Bilirubin: 0.5 mg/dL (ref 0.2–1.2)
Total Protein: 7.7 g/dL (ref 6.0–8.3)

## 2019-04-20 LAB — IBC PANEL
Iron: 81 ug/dL (ref 42–165)
Saturation Ratios: 22.4 % (ref 20.0–50.0)
Transferrin: 258 mg/dL (ref 212.0–360.0)

## 2019-04-20 LAB — VITAMIN D 25 HYDROXY (VIT D DEFICIENCY, FRACTURES): VITD: 23.59 ng/mL — ABNORMAL LOW (ref 30.00–100.00)

## 2019-04-20 MED ORDER — INSULIN ASPART PROT & ASPART (70-30 MIX) 100 UNIT/ML PEN: PEN_INJECTOR | SUBCUTANEOUS | 11 refills | Status: DC

## 2019-04-20 NOTE — Patient Instructions (Signed)

## 2019-04-20 NOTE — Telephone Encounter (Signed)
Pt has been informed of results and expressed understanding.  °

## 2019-04-20 NOTE — Telephone Encounter (Signed)
-----   Message from Biagio Borg, MD sent at 04/20/2019 12:31 PM EDT ----- Left message on MyChart, pt to cont same tx except The test results show that your current treatment is OK, except the Vitamin D level is mildly low, the testosterone level has not improved at all, and the A1c is still very high.   We recommend: 1) start OTC VItamin D3 at 2000 units per day indefinitely to help with bone health 2)  Make sure to take the androgel every day 3)  OK to increase the 70/30 insulin to 30 units twice per day, as the smaller dose you now take is not working well at all 4) please get with your provider at the [place] to see about a referral to Endocrinology at the [facility] for the low testosterone and uncontrolled diabetes  [name] to please inform pt of insulin change, and need to see endocrinology at the [facility]

## 2019-04-20 NOTE — Progress Notes (Signed)
Subjective:    Patient ID: [name], male    DOB: [birth year], 63 y.o.   MRN: [id number]  HPI  Here to f/u; overall doing ok,  Pt denies chest pain, increasing sob or doe, wheezing, orthopnea, PND, increased LE swelling, palpitations, dizziness or syncope.  Pt denies new neurological symptoms such as new headache, or facial or extremity weakness or numbness.  Pt denies polydipsia, polyuria, or low sugar episode.  Pt states overall good compliance with meds, mostly trying to follow appropriate diet, with wt overall stable,  but little exercise however. Tolerating new androgel with some improved stamina.  Also now on new insulin 70/30 per [place].  Past Medical History:  Diagnosis Date  . ABNORMAL CHEST XRAY 2009  . Biceps tendon rupture   . DIABETES MELLITUS, TYPE II   . ERECTILE DYSFUNCTION, MILD   . Hyperlipidemia 07/14/2016   History, normal test results in 09/2018  . HYPERTENSION   . Hypogonadism male 03/08/2014  . Hypogonadism, male   . Overweight(278.02)   . PSEUDOFOLLICULITIS BARBAE   . PULMONARY NODULE, RIGHT UPPER LOBE 02/10/2009   MD just watching   Past Surgical History:  Procedure Laterality Date  . COLONOSCOPY  11/2008   Ardis Hughs poylps  . eye lid surgey Left   . NASAL POLYP SURGERY    . NASAL POLYP SURGERY  1980s  . WISDOM TOOTH EXTRACTION      reports that he quit smoking about 27 years ago. His smoking use included cigarettes. He has a 18.00 pack-year smoking history. He has never used smokeless tobacco. He reports that he does not drink alcohol or use drugs. family history includes Cancer in his brother; Diabetes in his mother; Hypertension in his mother; Kidney failure in his father. No Known Allergies Current Outpatient Medications on File Prior to Visit  Medication Sig Dispense Refill  . clindamycin (CLEOCIN T) 1 % external solution APPLY EXTERNALLY TO THE AFFECTED AREA TWICE DAILY 60 mL 5  . glimepiride (AMARYL) 4 MG tablet Take 1 tablet (4 mg total) by mouth  daily with breakfast. Follow-up appt due in May must see provider for future refills 90 tablet 0  . irbesartan (AVAPRO) 150 MG tablet TAKE 1 TABLET BY MOUTH EVERY DAY 90 tablet 1  . metFORMIN (GLUCOPHAGE) 500 MG tablet TAKE 1 TABLET(500 MG) BY MOUTH FOUR TIMES DAILY (Patient taking differently: 1000 mg at 12 Noon  And 1000 mg at night) 360 tablet 1  . testosterone (ANDROGEL) 50 MG/5GM (1%) GEL PLACE 5 GRAMS ONTO THE SKIN DAILY 450 g 1  . TRULICITY 1.5 EV/0.3JK SOPN INJECT 0.5 ML UNDER THE SKIN ONCE PER WEEK (Patient taking differently: On Sunday) 6 mL 0   No current facility-administered medications on file prior to visit.     Review of Systems  Constitutional: Negative for other unusual diaphoresis or sweats HENT: Negative for ear discharge or swelling Eyes: Negative for other worsening visual disturbances Respiratory: Negative for stridor or other swelling  Gastrointestinal: Negative for worsening distension or other blood Genitourinary: Negative for retention or other urinary change Musculoskeletal: Negative for other MSK pain or swelling Skin: Negative for color change or other new lesions Neurological: Negative for worsening tremors and other numbness  Psychiatric/Behavioral: Negative for worsening agitation or other fatigue All other system neg per pt    Objective:   Physical Exam BP 126/86   Pulse 86   Temp 98.1 F (36.7 C) (Oral)   Ht 6' (1.829 m)   Wt 268 lb (  121.6 kg)   SpO2 94%   BMI 36.35 kg/m  VS noted,  Constitutional: Pt appears in NAD HENT: Head: NCAT.  Right Ear: External ear normal.  Left Ear: External ear normal.  Eyes: . Pupils are equal, round, and reactive to light. Conjunctivae and EOM are normal Nose: without d/c or deformity Neck: Neck supple. Gross normal ROM Cardiovascular: Normal rate and regular rhythm.   Pulmonary/Chest: Effort normal and breath sounds without rales or wheezing.  Abd:  Soft, NT, ND, + BS, no organomegaly Neurological: Pt is  alert. At baseline orientation, motor grossly intact Skin: Skin is warm. No rashes, other new lesions, no LE edema Psychiatric: Pt behavior is normal without agitation  No other exam findings  Lab Results  Component Value Date   WBC 6.0 10/12/2018   HGB 12.8 (L) 10/12/2018   HCT 39.6 10/12/2018   PLT 143.0 (L) 10/12/2018   GLUCOSE 179 (H) 10/12/2018   CHOL 123 10/12/2018   TRIG 56.0 10/12/2018   HDL 43.60 10/12/2018   LDLCALC 69 10/12/2018   ALT 49 10/12/2018   AST 35 10/12/2018   NA 141 10/12/2018   K 4.4 10/12/2018   CL 104 10/12/2018   CREATININE 0.81 10/12/2018   BUN 18 10/12/2018   CO2 28 10/12/2018   TSH 1.59 10/12/2018   PSA 0.69 10/12/2018   HGBA1C 9.9 (H) 10/12/2018   MICROALBUR 2.6 (H) 10/12/2018        Assessment & Plan:

## 2019-04-21 ENCOUNTER — Encounter: Payer: Self-pay | Admitting: Internal Medicine

## 2019-04-21 NOTE — Assessment & Plan Note (Signed)
stable overall by history and exam, recent data reviewed with pt, and pt to continue medical treatment as before,  to f/u any worsening symptoms or concerns, for lipids with labs 

## 2019-04-21 NOTE — Assessment & Plan Note (Signed)
stable overall by history and exam, recent data reviewed with pt, and pt to continue medical treatment as before,  to f/u any worsening symptoms or concerns, for a1c with labs 

## 2019-04-21 NOTE — Assessment & Plan Note (Signed)
Stable, for testosterone level with labs

## 2019-04-21 NOTE — Assessment & Plan Note (Signed)
stable overall by history and exam, recent data reviewed with pt, and pt to continue medical treatment as before,  to f/u any worsening symptoms or concerns  

## 2019-04-26 DIAGNOSIS — Z0279 Encounter for issue of other medical certificate: Secondary | ICD-10-CM

## 2019-05-29 DIAGNOSIS — Z794 Long term (current) use of insulin: Secondary | ICD-10-CM | POA: Diagnosis not present

## 2019-05-29 DIAGNOSIS — E119 Type 2 diabetes mellitus without complications: Secondary | ICD-10-CM | POA: Diagnosis not present

## 2019-06-04 ENCOUNTER — Telehealth: Payer: Self-pay | Admitting: Internal Medicine

## 2019-06-04 NOTE — Telephone Encounter (Signed)
Patient states that he would like to be prescribed a freestyle libre monitor, if possible. Requesting call back from CMA to discuss further, if needed.

## 2019-06-04 NOTE — Telephone Encounter (Signed)
Spoke with pt, informed him to check with his insurance carrier to see if that specific meter is covered. If not, find out what brands are covered so we can get that sent in to his pharmacy.

## 2019-06-04 NOTE — Telephone Encounter (Signed)
Pt returned call for Shirron and stated that the freestyle Elenor Legato was covered under insurance/ Rx needs to be sent to pharmacy/ please advise

## 2019-06-05 MED ORDER — FREESTYLE LIBRE 14 DAY SENSOR MISC: 1.0000 | 3 refills | Status: DC

## 2019-06-05 MED ORDER — FREESTYLE LIBRE 14 DAY READER DEVI: 1.0000 | Freq: Every day | 0 refills | Status: DC

## 2019-06-05 NOTE — Telephone Encounter (Signed)
Ok this is done, thanks 

## 2019-06-05 NOTE — Addendum Note (Signed)
Addended by: Biagio Borg on: 06/05/2019 06:51 AM   Modules accepted: Orders

## 2019-06-21 DIAGNOSIS — H16042 Marginal corneal ulcer, left eye: Secondary | ICD-10-CM | POA: Diagnosis not present

## 2019-06-21 DIAGNOSIS — H02886 Meibomian gland dysfunction of left eye, unspecified eyelid: Secondary | ICD-10-CM | POA: Diagnosis not present

## 2019-06-21 DIAGNOSIS — H02883 Meibomian gland dysfunction of right eye, unspecified eyelid: Secondary | ICD-10-CM | POA: Diagnosis not present

## 2019-07-09 ENCOUNTER — Ambulatory Visit: Payer: Federal, State, Local not specified - PPO | Admitting: Internal Medicine

## 2019-07-26 DIAGNOSIS — I10 Essential (primary) hypertension: Secondary | ICD-10-CM | POA: Diagnosis not present

## 2019-07-26 DIAGNOSIS — I200 Unstable angina: Secondary | ICD-10-CM | POA: Diagnosis not present

## 2019-07-26 DIAGNOSIS — E119 Type 2 diabetes mellitus without complications: Secondary | ICD-10-CM | POA: Diagnosis not present

## 2019-07-26 DIAGNOSIS — E291 Testicular hypofunction: Secondary | ICD-10-CM | POA: Diagnosis not present

## 2019-08-09 DIAGNOSIS — R079 Chest pain, unspecified: Secondary | ICD-10-CM | POA: Diagnosis not present

## 2019-08-31 ENCOUNTER — Other Ambulatory Visit: Payer: Self-pay | Admitting: Internal Medicine

## 2019-08-31 NOTE — Telephone Encounter (Signed)
testosterone (ANDROGEL) 50 MG/5GM (1%) GEL     Patient requesting refill.     [facility] [place], [place] [phone] (Phone) [phone] (Fax

## 2019-08-31 NOTE — Telephone Encounter (Signed)
Routing to CMA 

## 2019-09-03 MED ORDER — TESTOSTERONE 50 MG/5GM (1%) TD GEL: TRANSDERMAL | 1 refills | Status: DC

## 2019-09-03 NOTE — Telephone Encounter (Signed)
Done erx 

## 2019-09-18 DIAGNOSIS — E119 Type 2 diabetes mellitus without complications: Secondary | ICD-10-CM | POA: Diagnosis not present

## 2019-09-18 DIAGNOSIS — Z794 Long term (current) use of insulin: Secondary | ICD-10-CM | POA: Diagnosis not present

## 2019-09-20 ENCOUNTER — Telehealth: Payer: Self-pay

## 2019-09-20 MED ORDER — METFORMIN HCL 500 MG PO TABS: ORAL_TABLET | ORAL | 1 refills | Status: DC

## 2019-09-20 NOTE — Telephone Encounter (Signed)
none

## 2019-09-20 NOTE — Telephone Encounter (Signed)
done

## 2019-09-22 DIAGNOSIS — E119 Type 2 diabetes mellitus without complications: Secondary | ICD-10-CM | POA: Diagnosis not present

## 2019-09-22 DIAGNOSIS — I10 Essential (primary) hypertension: Secondary | ICD-10-CM | POA: Diagnosis not present

## 2019-10-02 DIAGNOSIS — H02889 Meibomian gland dysfunction of unspecified eye, unspecified eyelid: Secondary | ICD-10-CM | POA: Diagnosis not present

## 2019-10-02 DIAGNOSIS — H35039 Hypertensive retinopathy, unspecified eye: Secondary | ICD-10-CM | POA: Diagnosis not present

## 2019-10-02 DIAGNOSIS — E113291 Type 2 diabetes mellitus with mild nonproliferative diabetic retinopathy without macular edema, right eye: Secondary | ICD-10-CM | POA: Diagnosis not present

## 2019-10-02 DIAGNOSIS — I10 Essential (primary) hypertension: Secondary | ICD-10-CM | POA: Diagnosis not present

## 2019-10-06 ENCOUNTER — Other Ambulatory Visit: Payer: Self-pay

## 2019-10-06 ENCOUNTER — Ambulatory Visit (HOSPITAL_COMMUNITY)
Admission: EM | Admit: 2019-10-06 | Discharge: 2019-10-06 | Disposition: A | Discharge status: Home / Self Care | Payer: Federal, State, Local not specified - PPO | Attending: Family Medicine | Admitting: Family Medicine

## 2019-10-06 ENCOUNTER — Encounter (HOSPITAL_COMMUNITY): Payer: Self-pay

## 2019-10-06 DIAGNOSIS — M7061 Trochanteric bursitis, right hip: Secondary | ICD-10-CM

## 2019-10-06 DIAGNOSIS — I10 Essential (primary) hypertension: Secondary | ICD-10-CM

## 2019-10-06 MED ORDER — DICLOFENAC SODIUM 75 MG PO TBEC: 75.0000 mg | DELAYED_RELEASE_TABLET | Freq: Two times a day (BID) | ORAL | 0 refills | Status: DC

## 2019-10-06 NOTE — ED Triage Notes (Signed)
Pt presents with bilateral hip pain X 7 days

## 2019-10-08 NOTE — ED Provider Notes (Signed)
[place]   [id number] 10/06/19 Arrival Time: [id number]  ASSESSMENT & PLAN:  1. Trochanteric bursitis of right hip   2. Essential hypertension      No indication for imaging today. Discussed.  To begin: Meds ordered this encounter  Medications  . diclofenac (VOLTAREN) 75 MG EC tablet    Sig: Take 1 tablet (75 mg total) by mouth 2 (two) times daily.    Dispense:  14 tablet    Refill:  0   WBAT.  Recommend: Follow-up Information    [place].   Why: If worsening or failing to improve as anticipated. Contact information: [address] [place] [phone]         Plans to f/u with PCP to recheck BP.  Reviewed expectations re: course of current medical issues. Questions answered. Outlined signs and symptoms indicating need for more acute intervention. Patient verbalized understanding. After Visit Summary given.  SUBJECTIVE: History from: patient. [name] is a 63 y.o. male who reports intermittent moderate pain of his right hip; described as dull with occasional sharp pain; without radiation. Onset: gradual. First noted: a week ago. Injury/trama: no. Symptoms have progressed to a point and plateaued since beginning. Aggravating factors: prolonged walking/standing. Alleviating factors: rest. Associated symptoms: none reported. Extremity sensation changes or weakness: none. Self treatment: has not tried OTC therapies.  History of similar: no. Reports normal bowel/bladder habits.  Past Surgical History:  Procedure Laterality Date  . COLONOSCOPY  11/2008   Ardis Hughs poylps  . eye lid surgey Left   . NASAL POLYP SURGERY    . NASAL POLYP SURGERY  1980s  . WISDOM TOOTH EXTRACTION      Increased blood pressure noted today. Reports that he has been treated for hypertension in the past.  He reports taking medications as instructed, no chest pain on exertion, no dyspnea on exertion, no  swelling of ankles, no orthostatic dizziness or lightheadedness, no orthopnea or paroxysmal nocturnal dyspnea, no palpitations and no intermittent claudication symptoms.   ROS: As per HPI. All other systems negative.    OBJECTIVE:  Vitals:   10/06/19 1756  BP: (!) 146/91  Pulse: 97  Resp: 18  Temp: 98.6 F (37 C)  TempSrc: Oral  SpO2: 99%    General appearance: alert; no distress HEENT: [place]; AT Neck: supple with FROM Resp: unlabored respirations Extremities: . RLE: warm with well perfused appearance; fairly well localized moderate tenderness over right trochanteric bursa; without gross deformities; swelling: none; bruising: none; hip ROM: normal bilaterally without reported discomfort CV: brisk extremity capillary refill of bilateral LE; 2+ DP/PT pulses of bilateral LE. Skin: warm and dry; no visible rashes Neurologic: gait normal; normal reflexes of bilateral LE; normal sensation of bilateral LE; normal strength of bilateral LE  Psychological: alert and cooperative; normal mood and affect   No Known Allergies  Past Medical History:  Diagnosis Date  . ABNORMAL CHEST XRAY 2009  . Biceps tendon rupture   . DIABETES MELLITUS, TYPE II   . ERECTILE DYSFUNCTION, MILD   . Hyperlipidemia 07/14/2016   History, normal test results in 09/2018  . HYPERTENSION   . Hypogonadism male 03/08/2014  . Hypogonadism, male   . Overweight(278.02)   . PSEUDOFOLLICULITIS BARBAE   . PULMONARY NODULE, RIGHT UPPER LOBE 02/10/2009   MD just watching   Social History   Socioeconomic History  . Marital status: Married    Spouse name: Not on file  . Number  of children: 2  . Years of education: Not on file  . Highest education level: Not on file  Occupational History  . Occupation: WORKS [employer]    Employer: [employer]  Social Needs  . Financial resource strain: Not on file  . Food insecurity    Worry: Not on file    Inability: Not on file  . Transportation needs    Medical:  Not on file    Non-medical: Not on file  Tobacco Use  . Smoking status: Former Smoker    Packs/day: 1.00    Years: 18.00    Pack years: 18.00    Types: Cigarettes    Quit date: 10/20/1991    Years since quitting: 27.9  . Smokeless tobacco: Never Used  Substance and Sexual Activity  . Alcohol use: No  . Drug use: No  . Sexual activity: Yes    Partners: Female  Lifestyle  . Physical activity    Days per week: Not on file    Minutes per session: Not on file  . Stress: Not on file  Relationships  . Social Herbalist on phone: Not on file    Gets together: Not on file    Attends religious service: Not on file    Active member of club or organization: Not on file    Attends meetings of clubs or organizations: Not on file    Relationship status: Not on file  Other Topics Concern  . 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 Married 7 years, single 7 years, married 13 years (1995). 2 daughters- '80, '84- 2 grandchildren-'99,  '06      Regular exercise: walk 3-4 x a week; lifting light weights   Caffeine use: none   Family History  Problem Relation Age of Onset  . Hypertension Mother   . Diabetes Mother   . Kidney failure Father   . Cancer Brother        Pancreatic  . Coronary artery disease Neg Hx   . Colon cancer Neg Hx   . Rectal cancer Neg Hx   . Stomach cancer Neg Hx    Past Surgical History:  Procedure Laterality Date  . COLONOSCOPY  11/2008   Ardis Hughs poylps  . eye lid surgey Left   . NASAL POLYP SURGERY    . NASAL POLYP SURGERY  1980s  . WISDOM TOOTH EXTRACTION        Vanessa Kick, MD 10/08/19 [phone]

## 2019-10-12 DIAGNOSIS — Z794 Long term (current) use of insulin: Secondary | ICD-10-CM | POA: Diagnosis not present

## 2019-10-12 DIAGNOSIS — E119 Type 2 diabetes mellitus without complications: Secondary | ICD-10-CM | POA: Diagnosis not present

## 2019-10-15 DIAGNOSIS — I10 Essential (primary) hypertension: Secondary | ICD-10-CM | POA: Diagnosis not present

## 2019-10-16 DIAGNOSIS — Z794 Long term (current) use of insulin: Secondary | ICD-10-CM | POA: Diagnosis not present

## 2019-10-16 DIAGNOSIS — E119 Type 2 diabetes mellitus without complications: Secondary | ICD-10-CM | POA: Diagnosis not present

## 2019-10-23 ENCOUNTER — Ambulatory Visit (INDEPENDENT_AMBULATORY_CARE_PROVIDER_SITE_OTHER): Payer: Federal, State, Local not specified - PPO | Admitting: Family Medicine

## 2019-10-23 ENCOUNTER — Encounter: Payer: Self-pay | Admitting: Family Medicine

## 2019-10-23 ENCOUNTER — Other Ambulatory Visit: Payer: Self-pay

## 2019-10-23 VITALS — BP 136/84 | HR 68 | Temp 98.2°F | Ht 72.0 in | Wt 257.0 lb

## 2019-10-23 DIAGNOSIS — M25551 Pain in right hip: Secondary | ICD-10-CM

## 2019-10-23 DIAGNOSIS — M5416 Radiculopathy, lumbar region: Secondary | ICD-10-CM

## 2019-10-23 MED ORDER — PREDNISONE 50 MG PO TABS: 50.0000 mg | ORAL_TABLET | Freq: Every day | ORAL | 0 refills | Status: DC

## 2019-10-23 NOTE — Patient Instructions (Addendum)
Thank you for coming in today. Take the prednisone.  Attend PT.  Do the exercise and stretches we discussed.  Side leg raise, cross over and figure 4 stretch and standing IT band stretch. Remember to bend the back knee a little.   Recheck in about 5 weeks or sooner if needed.

## 2019-10-23 NOTE — Progress Notes (Signed)
Subjective:    CC: Hip pain.  HPI: Patient has intermittent right lateral hip pain ongoing for about 3 weeks now.  Pain started in early November.  He was seen in urgent care on Nov 7th and thought to have trochanteric bursitis.  He was treated with oral diclofenac and advised to follow-up with me for further evaluation and management.  He notes that he never did take the oral diclofenac out of concern for its potential side effects.  Pain is located in the lateral hip and buttocks.  Additionally he notes some pain radiating down the lower leg past the knee to the lateral calf and dorsal foot.  He notes the pain in the lateral hip and buttocks is worse with activity and better with rest.  It slightly improving now.  Additionally he notes the pain radiating down the lower leg is also typically worse with activity.  He notes it does not bother him much at bedtime.  He denies any weakness or numbness.  Also he denies any bowel or bladder dysfunction.  Past medical history, Surgical history, Family history not pertinant except as noted below, Social history, Allergies, and medications have been entered into the medical record, reviewed, and no changes needed.   Review of Systems: No headache, visual changes, nausea, vomiting, diarrhea, constipation, dizziness, abdominal pain, skin rash, fevers, chills, night sweats, weight loss, swollen lymph nodes, body aches, joint swelling, muscle aches, chest pain, shortness of breath, mood changes, visual or auditory hallucinations.   Objective:    Vitals:   10/23/19 1336  BP: 136/84  Pulse: 68  Temp: 98.2 F (36.8 C)  SpO2: 96%   General: Well Developed, well nourished, and in no acute distress.  Neuro/Psych: Alert and oriented x3, extra-ocular muscles intact, able to move all 4 extremities, sensation grossly intact. Skin: Warm and dry, no rashes noted.  Respiratory: Not using accessory muscles, speaking in full sentences, trachea midline.   Cardiovascular: Pulses palpable, no extremity edema. Abdomen: Does not appear distended. MSK:  L-spine: Nontender to spinal midline. Nontender paraspinal musculature. Lumbar motion normal rotation lateral flexion extension.  Limited flexion. Mildly positive right straight leg raise test. Lower extremity strength reflexes and sensation are intact intact with exception as listed below.  Right hip normal-appearing Normal range of motion. Tender palpation greater trochanter region. Hip abduction strength diminished 4/5.  External rotation internal rotation and adduction normal. Some pain with crossover compression test.  Left hip normal-appearing normal motion nontender normal strength.  Lab and Radiology Results EXAM: DG HIP (WITH OR WITHOUT PELVIS) 2-3V LEFT Date of service November 20, 2016  COMPARISON:  None.  FINDINGS: No fracture or dislocation is seen.  Bilateral hip joint spaces are preserved.  Visualized bony pelvis appears intact.  Mild degenerative changes of the lower lumbar spine.  IMPRESSION: Negative.   Electronically Signed   By: Julian Hy M.D.   On: 11/20/2016 10:51  EXAM: LUMBAR SPINE - COMPLETE 4+ VIEW Date of service November 20, 2016 COMPARISON:  None.  FINDINGS: There are 6 non rib-bearing lumbar for rib. Vertebral body alignment and heights are normal. There is mild spondylosis throughout the lumbar spine to include moderate facet arthropathy of the mid to lower lumbar spine. There is disc space narrowing at the L4-5, L5-6 and L6-S1 levels. No compression fracture or spondylolisthesis. No definite spondylolysis.  IMPRESSION: No acute findings.  Mild spondylosis of the lumbar spine. Mild multilevel disc disease as described.   Electronically Signed   By: Quillian Quince  Derrel Nip M.D.   On: 11/20/2016 10:52  I, Lynne Leader, personally (independently) visualized and performed the interpretation of the images attached in this  note.   Impression and Recommendations:    Assessment and Plan: 63 y.o. male with right lateral hip pain and right lower leg pain.  Multifactorial.  Pain due to trochanteric bursitis/hip abductor tendinopathy and lumbar radiculopathy. Plan to proceed with course of prednisone for presumed lumbar radiculopathy likely affecting L4 or L5 nerve root.  Additionally will proceed with referral to physical therapy as below.  Recheck back in 1 month..  Patient additionally has lateral hip pain due to hip abductor tendinopathy.  Plan to proceed with physical therapy referral.  Prednisone should also help some as well.  Recheck back in 1 month as above.  Precautions reviewed.   Orders Placed This Encounter  Procedures  . Ambulatory referral to Physical Therapy    Referral Priority:   Routine    Referral Type:   Physical Medicine    Referral Reason:   Specialty Services Required    Requested Specialty:   Physical Therapy   Meds ordered this encounter  Medications  . predniSONE (DELTASONE) 50 MG tablet    Sig: Take 1 tablet (50 mg total) by mouth daily.    Dispense:  5 tablet    Refill:  0    Discussed warning signs or symptoms. Please see discharge instructions. Patient expresses understanding.

## 2019-10-30 ENCOUNTER — Other Ambulatory Visit (INDEPENDENT_AMBULATORY_CARE_PROVIDER_SITE_OTHER): Payer: Federal, State, Local not specified - PPO

## 2019-10-30 ENCOUNTER — Encounter: Payer: Self-pay | Admitting: Internal Medicine

## 2019-10-30 ENCOUNTER — Other Ambulatory Visit: Payer: Self-pay

## 2019-10-30 ENCOUNTER — Ambulatory Visit (INDEPENDENT_AMBULATORY_CARE_PROVIDER_SITE_OTHER): Payer: Federal, State, Local not specified - PPO | Admitting: Internal Medicine

## 2019-10-30 ENCOUNTER — Other Ambulatory Visit: Payer: Self-pay | Admitting: Internal Medicine

## 2019-10-30 VITALS — BP 126/86 | HR 76 | Temp 98.4°F | Wt 264.0 lb

## 2019-10-30 DIAGNOSIS — Z0000 Encounter for general adult medical examination without abnormal findings: Secondary | ICD-10-CM

## 2019-10-30 DIAGNOSIS — E291 Testicular hypofunction: Secondary | ICD-10-CM | POA: Diagnosis not present

## 2019-10-30 DIAGNOSIS — I10 Essential (primary) hypertension: Secondary | ICD-10-CM

## 2019-10-30 DIAGNOSIS — M79661 Pain in right lower leg: Secondary | ICD-10-CM

## 2019-10-30 DIAGNOSIS — Z125 Encounter for screening for malignant neoplasm of prostate: Secondary | ICD-10-CM | POA: Diagnosis not present

## 2019-10-30 DIAGNOSIS — E559 Vitamin D deficiency, unspecified: Secondary | ICD-10-CM

## 2019-10-30 DIAGNOSIS — M5416 Radiculopathy, lumbar region: Secondary | ICD-10-CM | POA: Diagnosis not present

## 2019-10-30 DIAGNOSIS — E119 Type 2 diabetes mellitus without complications: Secondary | ICD-10-CM

## 2019-10-30 DIAGNOSIS — Z0001 Encounter for general adult medical examination with abnormal findings: Secondary | ICD-10-CM

## 2019-10-30 DIAGNOSIS — M7989 Other specified soft tissue disorders: Secondary | ICD-10-CM

## 2019-10-30 DIAGNOSIS — E785 Hyperlipidemia, unspecified: Secondary | ICD-10-CM

## 2019-10-30 LAB — CBC WITH DIFFERENTIAL/PLATELET
Basophils Absolute: 0.1 10*3/uL (ref 0.0–0.1)
Basophils Relative: 0.8 % (ref 0.0–3.0)
Eosinophils Absolute: 0.2 10*3/uL (ref 0.0–0.7)
Eosinophils Relative: 2.5 % (ref 0.0–5.0)
HCT: 41.2 % (ref 39.0–52.0)
Hemoglobin: 12.8 g/dL — ABNORMAL LOW (ref 13.0–17.0)
Lymphocytes Relative: 39.6 % (ref 12.0–46.0)
Lymphs Abs: 3.2 10*3/uL (ref 0.7–4.0)
MCHC: 31.1 g/dL (ref 30.0–36.0)
MCV: 72.3 fl — ABNORMAL LOW (ref 78.0–100.0)
Monocytes Absolute: 0.5 10*3/uL (ref 0.1–1.0)
Monocytes Relative: 6.3 % (ref 3.0–12.0)
Neutro Abs: 4.1 10*3/uL (ref 1.4–7.7)
Neutrophils Relative %: 50.8 % (ref 43.0–77.0)
Platelets: 157 10*3/uL (ref 150.0–400.0)
RBC: 5.71 Mil/uL (ref 4.22–5.81)
RDW: 16.4 % — ABNORMAL HIGH (ref 11.5–15.5)
WBC: 8 10*3/uL (ref 4.0–10.5)

## 2019-10-30 LAB — URINALYSIS, ROUTINE W REFLEX MICROSCOPIC
Bilirubin Urine: NEGATIVE
Hgb urine dipstick: NEGATIVE
Ketones, ur: NEGATIVE
Leukocytes,Ua: NEGATIVE
Nitrite: NEGATIVE
RBC / HPF: NONE SEEN (ref 0–?)
Specific Gravity, Urine: 1.025 (ref 1.000–1.030)
Total Protein, Urine: NEGATIVE
Urine Glucose: >=1000 — AB
Urobilinogen, UA: 0.2 (ref 0.0–1.0)
WBC, UA: NONE SEEN (ref 0–?)
pH: 6 (ref 5.0–8.0)

## 2019-10-30 LAB — LIPID PANEL
Cholesterol: 163 mg/dL (ref 0–200)
HDL: 47.2 mg/dL (ref >39.00)
LDL Cholesterol: 90 mg/dL (ref 0–99)
NonHDL: 116.1
Total CHOL/HDL Ratio: 3
Triglycerides: 130 mg/dL (ref 0.0–149.0)
VLDL: 26 mg/dL (ref 0.0–40.0)

## 2019-10-30 LAB — BASIC METABOLIC PANEL
BUN: 17 mg/dL (ref 6–23)
CO2: 29 mEq/L (ref 19–32)
Calcium: 9.6 mg/dL (ref 8.4–10.5)
Chloride: 102 mEq/L (ref 96–112)
Creatinine, Ser: 0.9 mg/dL (ref 0.40–1.50)
GFR: 103.26 mL/min (ref >60.00)
Glucose, Bld: 293 mg/dL — ABNORMAL HIGH (ref 70–99)
Potassium: 4.4 mEq/L (ref 3.5–5.1)
Sodium: 139 mEq/L (ref 135–145)

## 2019-10-30 LAB — HEPATIC FUNCTION PANEL
ALT: 51 U/L (ref 0–53)
AST: 31 U/L (ref 0–37)
Albumin: 4.2 g/dL (ref 3.5–5.2)
Alkaline Phosphatase: 55 U/L (ref 39–117)
Bilirubin, Direct: 0.1 mg/dL (ref 0.0–0.3)
Total Bilirubin: 0.4 mg/dL (ref 0.2–1.2)
Total Protein: 7 g/dL (ref 6.0–8.3)

## 2019-10-30 LAB — TSH: TSH: 2 u[IU]/mL (ref 0.35–4.50)

## 2019-10-30 LAB — VITAMIN D 25 HYDROXY (VIT D DEFICIENCY, FRACTURES): VITD: 17.69 ng/mL — ABNORMAL LOW (ref 30.00–100.00)

## 2019-10-30 LAB — PSA: PSA: 0.74 ng/mL (ref 0.10–4.00)

## 2019-10-30 LAB — MICROALBUMIN / CREATININE URINE RATIO
Creatinine,U: 86.8 mg/dL
Microalb Creat Ratio: 2.1 mg/g (ref 0.0–30.0)
Microalb, Ur: 1.8 mg/dL (ref 0.0–1.9)

## 2019-10-30 LAB — HEMOGLOBIN A1C: Hgb A1c MFr Bld: 13.2 % — ABNORMAL HIGH (ref 4.6–6.5)

## 2019-10-30 LAB — TESTOSTERONE: Testosterone: 143.26 ng/dL — ABNORMAL LOW (ref 300.00–890.00)

## 2019-10-30 MED ORDER — VITAMIN D (ERGOCALCIFEROL) 1.25 MG (50000 UNIT) PO CAPS: 50000.0000 [IU] | ORAL_CAPSULE | ORAL | 0 refills | Status: DC

## 2019-10-30 NOTE — Patient Instructions (Addendum)
Please continue all other medications as before, and refills have been done if requested.  Please have the pharmacy call with any other refills you may need.  Please continue your efforts at being more active, low cholesterol diet, and weight control.  You are otherwise up to date with prevention measures today.  Please keep your appointments with your specialists as you may have planned  You will be contacted regarding the referral for: MRI for the lower back, and the Venous Doppler for the vein testing (to see [facility] now)  Please go to the LAB in the Basement (turn left off the elevator) for the tests to be done today  You will be contacted by phone if any changes need to be made immediately.  Otherwise, you will receive a letter about your results with an explanation, but please check with MyChart first.  Please remember to sign up for MyChart if you have not done so, as this will be important to you in the future with finding out test results, communicating by private email, and scheduling acute appointments online when needed.  Please return in 6 months, or sooner if needed, with Lab testing done 3-5 days before

## 2019-10-30 NOTE — Assessment & Plan Note (Addendum)
With some suspicion for DVT, for d dimer, and RLE venous doppler  In addition to the time spent performing CPE, I spent an additional 25 minutes face to face,in which greater than 50% of this time was spent in counseling and coordination of care for patient's acute illness as documented, including the differential dx, treatment, further evaluation and other management of RLE pain/swelling, right lumbar radiculopathy, hypogonadism, HLD, HTLn DM,

## 2019-10-30 NOTE — Assessment & Plan Note (Signed)
Now taking the androgel daily, for f/u lab

## 2019-10-30 NOTE — Progress Notes (Signed)
Subjective:    Patient ID: [name], male    DOB: 1956/06/10, 63 y.o.   MRN: [id number]  HPI  Here for wellness and f/u;  Overall doing ok;  Pt denies Chest pain, worsening SOB, DOE, wheezing, orthopnea, PND, worsening LE edema, palpitations, dizziness or syncope.  Pt denies neurological change such as new headache, facial or extremity weakness.  Pt denies polydipsia, polyuria, or low sugar symptoms. Pt states overall good compliance with treatment and medications, good tolerability, and has been trying to follow appropriate diet.  Pt denies worsening depressive symptoms, suicidal ideation or panic. No fever, night sweats, wt loss, loss of appetite, or other constitutional symptoms.  Pt states good ability with ADL's, has low fall risk, home safety reviewed and adequate, no other significant changes in hearing or vision, and only occasionally active with exercise.  Also sen at UC recently then sports med for 2 wks right lateral hip pain and right lumbar radiculopathy, tx with prednisone 50 mq for 5 days, overall hard to say he is better after this, as he is much improved in the AM but then gradualy worse over the daytime and then can hardly walk or bear wt by evening. No giveaways or falls. Starts lower back with radation to the distal RLE, no weakness but some tingling as well.  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 Past Medical History:  Diagnosis Date  . ABNORMAL CHEST XRAY 2009  . Biceps tendon rupture   . DIABETES MELLITUS, TYPE II   . ERECTILE DYSFUNCTION, MILD   . Hyperlipidemia 07/14/2016   History, normal test results in 09/2018  . HYPERTENSION   . Hypogonadism male 03/08/2014  . Hypogonadism, male   . Overweight(278.02)   . PSEUDOFOLLICULITIS BARBAE   . PULMONARY NODULE, RIGHT  UPPER LOBE 02/10/2009   MD just watching   Past Surgical History:  Procedure Laterality Date  . COLONOSCOPY  11/2008   Ardis Hughs poylps  . eye lid surgey Left   . NASAL POLYP SURGERY    . NASAL POLYP SURGERY  1980s  . WISDOM TOOTH EXTRACTION      reports that he quit smoking about 28 years ago. His smoking use included cigarettes. He has a 18.00 pack-year smoking history. He has never used smokeless tobacco. He reports that he does not drink alcohol or use drugs. family history includes Cancer in his brother; Diabetes in his mother; Hypertension in his mother; Kidney failure in his father. No Known Allergies Current Outpatient Medications on File Prior to Visit  Medication Sig Dispense Refill  . clindamycin (CLEOCIN T) 1 % external solution APPLY EXTERNALLY TO THE AFFECTED AREA TWICE DAILY 60 mL 5  . Continuous Blood Gluc Receiver (FREESTYLE LIBRE 14 DAY READER) DEVI 1 Device by Does not apply route daily. Use as directed once daily E11.9 1 Device 0  . Continuous Blood Gluc Sensor (FREESTYLE LIBRE 14 DAY SENSOR) MISC Apply 1 Device topically every 14 (fourteen) days. E11.9 6 each 3  . glimepiride (AMARYL) 4 MG tablet Take 1 tablet (4 mg total) by mouth daily with breakfast. Follow-up appt due in May must see provider for future refills 90 tablet 0  . insulin aspart protamine - aspart (NOVOLOG MIX 70/30 FLEXPEN) (70-30) 100 UNIT/ML FlexPen 15 units sq in AM, 14  units sq in PM 15 mL 11  . irbesartan (AVAPRO) 150 MG tablet TAKE 1 TABLET BY MOUTH EVERY DAY 90 tablet 1  . metFORMIN (GLUCOPHAGE) 500 MG tablet 1000 mg at 12 Noon  And 1000 mg at night 360 tablet 1  . predniSONE (DELTASONE) 50 MG tablet Take 1 tablet (50 mg total) by mouth daily. 5 tablet 0  . testosterone (ANDROGEL) 50 MG/5GM (1%) GEL PLACE 5 GRAMS ONTO THE SKIN DAILY 450 g 1  . TRULICITY 1.5 0000000 SOPN INJECT 0.5 ML UNDER THE SKIN ONCE PER WEEK (Patient taking differently: On Sunday) 6 mL 0   No current facility-administered  medications on file prior to visit.    Review of Systems Constitutional: Negative for other unusual diaphoresis, sweats, appetite or weight changes HENT: Negative for other worsening hearing loss, ear pain, facial swelling, mouth sores or neck stiffness.   Eyes: Negative for other worsening pain, redness or other visual disturbance.  Respiratory: Negative for other stridor or swelling Cardiovascular: Negative for other palpitations or other chest pain  Gastrointestinal: Negative for worsening diarrhea or loose stools, blood in stool, distention or other pain Genitourinary: Negative for hematuria, flank pain or other change in urine volume.  Musculoskeletal: Negative for myalgias or other joint swelling.  Skin: Negative for other color change, or other wound or worsening drainage.  Neurological: Negative for other syncope or numbness. Hematological: Negative for other adenopathy or swelling Psychiatric/Behavioral: Negative for hallucinations, other worsening agitation, SI, self-injury, or new decreased concentration All otherwise neg per pt     Objective:   Physical Exam BP 126/86   Pulse 76   Temp 98.4 F (36.9 C) (Oral)   Wt 264 lb (119.7 kg)   SpO2 96%   BMI 35.80 kg/m  VS noted,  Constitutional: Pt is oriented to person, place, and time. Appears well-developed and well-nourished, in no significant distress and comfortable Head: Normocephalic and atraumatic  Eyes: Conjunctivae and EOM are normal. Pupils are equal, round, and reactive to light Right Ear: External ear normal without discharge Left Ear: External ear normal without discharge Nose: Nose without discharge or deformity Mouth/Throat: Oropharynx is without other ulcerations and moist  Neck: Normal range of motion. Neck supple. No JVD present. No tracheal deviation present or significant neck LA or mass Cardiovascular: Normal rate, regular rhythm, normal heart sounds and intact distal pulses.   Pulmonary/Chest: WOB  normal and breath sounds without rales or wheezing  Abdominal: Soft. Bowel sounds are normal. NT. No HSM  Musculoskeletal: Normal range of motion. Exhibits no edema Lymphadenopathy: Has no other cervical adenopathy.  Neurological: Pt is alert and oriented to person, place, and time. Pt has normal reflexes. No cranial nerve deficit. Motor grossly intact, Gait intact Skin: Skin is warm and dry. No rash noted or new ulcerations Psychiatric:  Has normal mood and affect. Behavior is normal without agitation All otherwise neg per pt Lab Results  Component Value Date   WBC 8.0 10/30/2019   HGB 12.8 (L) 10/30/2019   HCT 41.2 10/30/2019   PLT 157.0 10/30/2019   GLUCOSE 293 (H) 10/30/2019   CHOL 163 10/30/2019   TRIG 130.0 10/30/2019   HDL 47.20 10/30/2019   LDLCALC 90 10/30/2019   ALT 51 10/30/2019   AST 31 10/30/2019   NA 139 10/30/2019   K 4.4 10/30/2019   CL 102 10/30/2019   CREATININE 0.90 10/30/2019   BUN 17 10/30/2019   CO2 29 10/30/2019   TSH 2.00 10/30/2019   PSA 0.74 10/30/2019  HGBA1C 13.2 (H) 10/30/2019   MICROALBUR 1.8 10/30/2019      Assessment & Plan:

## 2019-10-31 ENCOUNTER — Other Ambulatory Visit: Payer: Self-pay

## 2019-10-31 ENCOUNTER — Ambulatory Visit (HOSPITAL_COMMUNITY)
Admission: RE | Admit: 2019-10-31 | Discharge: 2019-10-31 | Disposition: A | Discharge status: Home / Self Care | Payer: Federal, State, Local not specified - PPO | Source: Ambulatory Visit | Attending: Cardiovascular Disease | Admitting: Cardiovascular Disease

## 2019-10-31 DIAGNOSIS — M7989 Other specified soft tissue disorders: Secondary | ICD-10-CM | POA: Diagnosis not present

## 2019-10-31 DIAGNOSIS — M79661 Pain in right lower leg: Secondary | ICD-10-CM | POA: Diagnosis not present

## 2019-10-31 LAB — D-DIMER, QUANTITATIVE: D-Dimer, Quant: <0.19 mcg/mL FEU (ref <0.50)

## 2019-11-04 ENCOUNTER — Encounter: Payer: Self-pay | Admitting: Internal Medicine

## 2019-11-04 NOTE — Assessment & Plan Note (Signed)
stable overall by history and exam, recent data reviewed with pt, and pt to continue medical treatment as before,  to f/u any worsening symptoms or concerns  

## 2019-11-04 NOTE — Assessment & Plan Note (Signed)
Worsening now more chronic severe, for LS spine MRI

## 2019-11-04 NOTE — Assessment & Plan Note (Signed)

## 2019-11-12 ENCOUNTER — Other Ambulatory Visit: Payer: Self-pay

## 2019-11-12 ENCOUNTER — Other Ambulatory Visit: Payer: Self-pay | Admitting: Internal Medicine

## 2019-11-12 ENCOUNTER — Ambulatory Visit
Admission: RE | Admit: 2019-11-12 | Discharge: 2019-11-12 | Disposition: A | Discharge status: Home / Self Care | Payer: Federal, State, Local not specified - PPO | Source: Ambulatory Visit | Attending: Internal Medicine | Admitting: Internal Medicine

## 2019-11-12 DIAGNOSIS — M5416 Radiculopathy, lumbar region: Secondary | ICD-10-CM

## 2019-11-12 DIAGNOSIS — M48061 Spinal stenosis, lumbar region without neurogenic claudication: Secondary | ICD-10-CM

## 2019-11-14 ENCOUNTER — Telehealth: Payer: Self-pay

## 2019-11-14 NOTE — Telephone Encounter (Signed)
Pt returned call while you were at lunch, please advise  Best contact: [phone]

## 2019-11-14 NOTE — Telephone Encounter (Signed)
Pt has been informed of results and expressed understanding.  °

## 2019-11-14 NOTE — Telephone Encounter (Signed)
Called pt, LVM.    Copied from [place] [id number]. Topic: General - Inquiry >> Nov 14, 2019 11:31 AM Alanda Slim E wrote: Reason for CRM: Pt called to inquire about his MRI results / please advise

## 2019-11-27 ENCOUNTER — Ambulatory Visit: Payer: Federal, State, Local not specified - PPO | Admitting: Family Medicine

## 2019-12-11 ENCOUNTER — Ambulatory Visit: Payer: Federal, State, Local not specified - PPO | Admitting: Orthopaedic Surgery

## 2019-12-11 ENCOUNTER — Ambulatory Visit: Payer: Self-pay

## 2019-12-11 ENCOUNTER — Other Ambulatory Visit: Payer: Self-pay

## 2019-12-11 ENCOUNTER — Encounter: Payer: Self-pay | Admitting: Orthopaedic Surgery

## 2019-12-11 VITALS — BP 159/95 | HR 91 | Ht 74.0 in | Wt 260.0 lb

## 2019-12-11 DIAGNOSIS — M545 Low back pain, unspecified: Secondary | ICD-10-CM

## 2019-12-11 DIAGNOSIS — E119 Type 2 diabetes mellitus without complications: Secondary | ICD-10-CM

## 2019-12-11 NOTE — Progress Notes (Signed)
Office Visit Note   Patient: [name]           Date of Birth: Mar 30, 1956           MRN: [id number] Visit Date: 12/11/2019              Requested by: Biagio Borg, MD [place],  [place] [id number] PCP: Biagio Borg, MD   Assessment & Plan: Visit Diagnoses:  1. Acute right-sided low back pain, unspecified whether sciatica present     Plan: Reviewed the MRI scan images as well as plain radiographs.  He has severe stenosis at L4-5 but recently started having radicular symptoms in the right leg.  We will set him up for some physical therapy recheck him again in 6 weeks.  Follow-Up Instructions: No follow-ups on file.   Orders:  Orders Placed This Encounter  Procedures  . XR Lumbar Spine 2-3 Views   No orders of the defined types were placed in this encounter.     Procedures: No procedures performed   Clinical Data: No additional findings.   Subjective: Chief Complaint  Patient presents with  . Lower Back - Pain    HPI 64 year old male seen with back pain ago started a month ago he said pain in his back radiating down his right leg.  Pain radiates to his foot he states at times after standing for a while or walking he is barely able to continue he tends to drag his leg and has to stop and sit down.  He is used some Aleve occasionally without relief.  He does recall in the past occasionally had some times when he had some back symptoms.  MRI scan was done 11/12/2019 which showed grade 1 anterolisthesis at L4-5 with severe central stenosis and moderate to severe bilateral recess stenosis.  L5-S1 retrolisthesis with lateral recess stenosis and moderate by foraminal stenosis.  Review of Systems 14 point systems positive for hypertension, diabetes hyperlipidemia.  Sleep apnea otherwise negative as pertains HPI.   Objective: Vital Signs: BP (!) 159/95   Pulse 91   Ht 6\' 2"  (1.88 m)   Wt 260 lb (117.9 kg)   BMI 33.38 kg/m   Physical  Exam Constitutional:      Appearance: He is well-developed.  HENT:     Head: Normocephalic and atraumatic.  Eyes:     Pupils: Pupils are equal, round, and reactive to light.  Neck:     Thyroid: No thyromegaly.     Trachea: No tracheal deviation.  Cardiovascular:     Rate and Rhythm: Normal rate.  Pulmonary:     Effort: Pulmonary effort is normal.     Breath sounds: No wheezing.  Abdominal:     General: Bowel sounds are normal.     Palpations: Abdomen is soft.  Skin:    General: Skin is warm and dry.     Capillary Refill: Capillary refill takes less than 2 seconds.  Neurological:     Mental Status: He is alert and oriented to person, place, and time.  Psychiatric:        Behavior: Behavior normal.        Thought Content: Thought content normal.        Judgment: Judgment normal.     Ortho Exam patient has negative logroll the hips knees reach full extension.  Knee and ankle jerk are intact.  Anterior tib gastrocsoleus is normal.  He has sciatic notch tenderness on the right some  pain with straight leg raising 70 degrees on the right.  No atrophy distal pulses are 2+ good quad strength.  Specialty Comments:  No specialty comments available.  Imaging: CLINICAL DATA:  64 year old male with lumbar radiculopathy. Pain radiating to the right anterior leg for 3 weeks with no known injury.  EXAM: MRI LUMBAR SPINE WITHOUT CONTRAST  TECHNIQUE: Multiplanar, multisequence MR imaging of the lumbar spine was performed. No intravenous contrast was administered.  COMPARISON:  Lumbar radiographs 11/20/2016. chest CT 02/17/2009.  FINDINGS: Segmentation: 12 full size ribs on the comparison chest CT. Six lumbarized vertebral bodies, therefore S1 we designated as fully lumbarized. Correlation with radiographs is recommended prior to any operative intervention.  Alignment: Preserved lumbar lordosis, but new grade 1 anterolisthesis of L4 on L5 since 2017 measuring 3-4  millimeters. Mild retrolisthesis at L5-S1 also appears increased. Slight levoconvex lower lumbar scoliosis.  Vertebrae: No marrow edema or evidence of acute osseous abnormality. Visualized bone marrow signal is within normal limits. Incidental S3 sacral Tarlov cyst (normal variant). Intact visible sacrum and SI joints.  Conus medullaris and cauda equina: Conus extends to the L1 level. No lower spinal cord or conus signal abnormality.  Paraspinal and other soft tissues: Negative.  Disc levels:  T12-L1:  Negative.  L1-L2:  Negative.  L2-L3:  Negative.  L3-L4: Mild far lateral disc bulging. Mild to moderate posterior element hypertrophy. Borderline to mild bilateral L3 foraminal stenosis.  L4-L5: Grade 1 anterolisthesis. Disc space loss and circumferential disc bulge with broad-based posterior disc or pseudo disc. Broad-based foraminal involvement greater on the left. Moderate to severe facet and ligament flavum hypertrophy.  Severe spinal stenosis. Moderate to severe bilateral lateral recess stenosis. Moderate to severe left and mild to moderate right L4 foraminal stenosis.  L5-S1: Mild retrolisthesis. Disc space loss with circumferential disc bulge and endplate spurring. Broad-based posterior and biforaminal involvement. Moderate posterior element hypertrophy. Borderline to mild bilateral lateral recess stenosis with no spinal stenosis. Moderate left and mild to moderate right L5 foraminal stenosis.  S1-S2: Lumbarized. Mild circumferential disc bulge and endplate spurring. Mild posterior element hypertrophy. Mild bilateral S1 foraminal stenosis.  IMPRESSION: 1. Transitional lumbosacral anatomy. Twelve full size ribs on a prior chest CT, such that with 6 lumbarized levels the S1 level is designated as fully lumbarized. Correlation with radiographs is recommended prior to any operative intervention. 2. Grade 1 anterolisthesis at L4-L5 with disc and severe  posterior element degeneration. Subsequent severe spinal stenosis, moderate to severe bilateral lateral recess stenosis, and moderate to severe left greater than right foraminal stenosis. 3. L5-S1 mild retrolisthesis with multifactorial mild lateral recess stenosis and moderate bilateral foraminal stenosis.   Electronically Signed   By: Genevie Ann M.D.   On: 11/12/2019 08:29   PMFS History: Patient Active Problem List   Diagnosis Date Noted  . Pain and swelling of right lower leg 10/30/2019  . Right lumbar radiculopathy 10/30/2019  . Hearing loss 12/21/2018  . Obstructive sleep apnea 12/18/2018  . History of colon polyps 08/24/2017  . Hyperlipidemia 07/14/2016  . Hematuria 09/06/2014  . Hypogonadism male 03/08/2014  . Myalgia and myositis, unspecified 10/22/2012  . Encounter for well adult exam with abnormal findings 10/22/2012  . PULMONARY NODULE, RIGHT UPPER LOBE 02/10/2009  . Obesity, Class I, BMI 30-34.9 09/26/2008  . Nonspecific (abnormal) findings on radiological and other examination of body structure 07/04/2008  . Diabetes ([place]) 08/29/2007  . Erectile dysfunction associated with type 2 diabetes mellitus ([place]) 08/29/2007  . Essential hypertension 08/29/2007  .  PSEUDOFOLLICULITIS BARBAE [id number]   Past Medical History:  Diagnosis Date  . ABNORMAL CHEST XRAY 2009  . Biceps tendon rupture   . DIABETES MELLITUS, TYPE II   . ERECTILE DYSFUNCTION, MILD   . Hyperlipidemia 07/14/2016   History, normal test results in 09/2018  . HYPERTENSION   . Hypogonadism male 03/08/2014  . Hypogonadism, male   . Overweight(278.02)   . PSEUDOFOLLICULITIS BARBAE   . PULMONARY NODULE, RIGHT UPPER LOBE 02/10/2009   MD just watching    Family History  Problem Relation Age of Onset  . Hypertension Mother   . Diabetes Mother   . Kidney failure Father   . Cancer Brother        Pancreatic  . Coronary artery disease Neg Hx   . Colon cancer Neg Hx   . Rectal cancer Neg Hx   . Stomach  cancer Neg Hx     Past Surgical History:  Procedure Laterality Date  . COLONOSCOPY  11/2008   Ardis Hughs poylps  . eye lid surgey Left   . NASAL POLYP SURGERY    . NASAL POLYP SURGERY  1980s  . WISDOM TOOTH EXTRACTION     Social History   Occupational History  . Occupation: WORKS [employer]    Employer: [employer]  Tobacco Use  . Smoking status: Former Smoker    Packs/day: 1.00    Years: 18.00    Pack years: 18.00    Types: Cigarettes    Quit date: 10/20/1991    Years since quitting: 28.1  . Smokeless tobacco: Never Used  Substance and Sexual Activity  . Alcohol use: No  . Drug use: No  . Sexual activity: Yes    Partners: Female

## 2019-12-20 ENCOUNTER — Other Ambulatory Visit: Payer: Self-pay

## 2019-12-20 NOTE — Telephone Encounter (Signed)
New message   Need prior approval from [facility] -  [id number]    1. Which medications need to be refilled? (please list name of each medication and dose if known)   testosterone (ANDROGEL) 50 [id number] (1%) GEL  TRULICITY 1.5 0000000 SOPN  2. Which pharmacy/location (including street and city if local pharmacy) is medication to be sent to? [facility] in [place]

## 2019-12-21 MED ORDER — TESTOSTERONE 50 MG/5GM (1%) TD GEL: TRANSDERMAL | 1 refills | Status: DC

## 2019-12-21 MED ORDER — TRULICITY 1.5 MG/0.5ML ~~LOC~~ SOAJ: SUBCUTANEOUS | 3 refills | Status: DC

## 2019-12-21 NOTE — Telephone Encounter (Signed)
Done erx 

## 2019-12-26 ENCOUNTER — Other Ambulatory Visit: Payer: Self-pay

## 2019-12-26 ENCOUNTER — Ambulatory Visit (INDEPENDENT_AMBULATORY_CARE_PROVIDER_SITE_OTHER): Payer: Federal, State, Local not specified - PPO | Admitting: Physical Therapy

## 2019-12-26 ENCOUNTER — Encounter: Payer: Self-pay | Admitting: Physical Therapy

## 2019-12-26 DIAGNOSIS — M5441 Lumbago with sciatica, right side: Secondary | ICD-10-CM

## 2019-12-26 NOTE — Therapy (Signed)
[facility], [place], [address] Phone: [phone]   Fax:  [phone]  Physical Therapy Evaluation  Patient Details  Name: [name] MRN: [id number] Date of Birth: January 06, 1956 Referring Provider (PT): Marybelle Killings, MD   Encounter Date: 12/26/2019  PT End of Session - 12/26/19 0906    Visit Number  1    Number of Visits  8    Date for PT Re-Evaluation  02/06/20    Authorization Type  [employer] fed    PT Start Time  0805    PT Stop Time  0850    PT Time Calculation (min)  45 min    Activity Tolerance  Patient tolerated treatment well    Behavior During Therapy  [facility] for tasks assessed/performed       Past Medical History:  Diagnosis Date  . ABNORMAL CHEST XRAY 2009  . Biceps tendon rupture   . DIABETES MELLITUS, TYPE II   . ERECTILE DYSFUNCTION, MILD   . Hyperlipidemia 07/14/2016   History, normal test results in 09/2018  . HYPERTENSION   . Hypogonadism male 03/08/2014  . Hypogonadism, male   . Overweight(278.02)   . PSEUDOFOLLICULITIS BARBAE   . PULMONARY NODULE, RIGHT UPPER LOBE 02/10/2009   MD just watching    Past Surgical History:  Procedure Laterality Date  . COLONOSCOPY  11/2008   Ardis Hughs poylps  . eye lid surgey Left   . NASAL POLYP SURGERY    . NASAL POLYP SURGERY  1980s  . WISDOM TOOTH EXTRACTION      There were no vitals filed for this visit.   Subjective Assessment - 12/26/19 0808    Subjective  he has had acute onset of low back pain since December 2020. He says the pain runs down his Rt leg at times with feelings of Numbness or tingling. He denies having pain down his leg today and that overall his pain is getting some better but aggravated at the end of the day after working or with bending over to pick things up. He has no pain in the mornings but generall 7/10 pain at the end of the day after working    Pertinent History  DM, HTN    Limitations  Standing;House hold activities    How long  can you stand comfortably?  depends    How long can you walk comfortably?  depends    Diagnostic tests  radiculopathy, severe lumbar stenosis L4-5 on MRI.    Patient Stated Goals  get the pain down    Currently in Pain?  Yes    Pain Score  7    no pain in mornings but 7/10 pain at night   Pain Location  Back         [facility] PT Assessment - 12/26/19 0001      Assessment   Medical Diagnosis  Acute right-sided low back pain with radiculopathy, severe lumbar stenosis L4-5 on MRI.    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      Cognition   Overall Cognitive Status  Within Functional Limits for tasks assessed      Sensation   Light Touch  Appears Intact      Coordination   Gross Motor Movements are Fluid and Coordinated  Yes      Posture/Postural Control   Posture Comments  WFL      ROM / Strength   AROM / PROM / Strength  AROM;Strength      AROM   Overall AROM Comments  no pain with AROM today    AROM Assessment Site  Lumbar    Lumbar Flexion  75%    Lumbar Extension  50%    Lumbar - Right Side Bend  75%    Lumbar - Left Side Bend  75%    Lumbar - Right Rotation  75%    Lumbar - Left Rotation  75%      Strength   Overall Strength Comments  bilat leg strength overall 5/5 MMT      Flexibility   Soft Tissue Assessment /Muscle Length  --   tight hamstrings, hips, lumbar P.S. bilat     Palpation   Spinal mobility  WNL    Palpation comment  no TTP reported      Special Tests   Other special tests  negative slump test, neg SLR test, negative quadrant test       Transfers   Transfers  Independent with all Transfers                Objective measurements completed on examination: See above findings.      [place] Adult PT Treatment/Exercise - 12/26/19 0001      Ambulation/Gait   Gait Comments  WFL      Modalities   Modalities  Electrical Stimulation;Moist Heat      Moist Heat Therapy   Number Minutes Moist Heat  15 Minutes    Moist Heat Location  Lumbar Spine      Electrical Stimulation   Electrical Stimulation Location  lumbar    Electrical Stimulation Action  IFC in supine in hooklying    Electrical Stimulation Parameters  tolerance    Electrical Stimulation Goals  Pain             PT Education - 12/26/19 0906    Education Details  HEP, POC, exam findings    Person(s) Educated  Patient    Methods  Explanation;Demonstration;Verbal cues;Handout    Comprehension  Verbalized understanding;Need further instruction          PT Long Term Goals - 12/26/19 [id number]      PT LONG TERM GOAL #1   Title  Pt will be I and compliant with HEP (target for all goals 6 weeks 02/06/20)    Status  New      PT LONG TERM GOAL #2   Title  Pt will reduce overall back pain to less than 2-3/10 with ususal activity and work    Status  New      PT LONG TERM GOAL #3   Title  Pt will improve lumbar ROM to [facility]    Status  New             Plan - 12/26/19 0907    Clinical Impression Statement  Pt presents with Acute right-sided low back pain with radiculopathy, severe lumbar  stenosis L4-5 on MRI. He has good overall leg strength but has decreased lumbar ROM, increased muscle tension in paraspinals and glutes, decreased activity tolerance for standing and work duties, decreased core strength, and increased pain limiting his full function. He will benefit from skilled PT to address his deficits. He recieved lumbar fleixon based stretching program to trial at home along with TENS.    Personal Factors and Comorbidities  Comorbidity 2     Comorbidities  DM, HTN    Examination-Activity Limitations  Bend;Lift;Stand    Examination-Participation Restrictions  Cleaning;Community Activity;Shop;Other   work   Public affairs consultant  Low    Rehab Potential  Good    PT Frequency  1x / week   will trial HEP first few weeks then return   PT Duration  6 weeks    PT Treatment/Interventions  ADLs/Self Care Home Management;Cryotherapy;Electrical Stimulation;Moist Heat;Traction;Ultrasound;Therapeutic activities;Therapeutic exercise;Neuromuscular re-education;Manual techniques;Passive range of motion;Dry needling;Joint Manipulations;Spinal Manipulations;Taping    PT Next Visit Plan  review and update HEP PRN, consider modataties including traction or DN for pain as needed. pogress core strength as able    PT Home Exercise Plan  Access Code: [email or web address]    Consulted and Agree with Plan of Care  Patient       Patient will benefit from skilled therapeutic intervention in order to improve the following deficits and impairments:  Decreased activity tolerance, Decreased endurance, Decreased range of motion, Decreased strength, Increased muscle spasms, Impaired flexibility, Pain  Visit Diagnosis: Acute bilateral low back pain with right-sided sciatica     Problem List Patient Active Problem List   Diagnosis Date Noted  . Pain and swelling of right lower leg 10/30/2019  . Right lumbar radiculopathy 10/30/2019  . Hearing loss 12/21/2018  . Obstructive sleep apnea 12/18/2018  . History of colon polyps 08/24/2017  . Hyperlipidemia 07/14/2016  . Hematuria 09/06/2014  . Hypogonadism male 03/08/2014  . Myalgia and myositis, unspecified 10/22/2012  . Encounter for well adult exam with abnormal findings 10/22/2012  . PULMONARY NODULE, RIGHT UPPER LOBE 02/10/2009  . Obesity, Class I, BMI 30-34.9 09/26/2008  . Nonspecific (abnormal) findings on radiological and other examination  of body structure 07/04/2008  . Diabetes ([place]) 08/29/2007  . Erectile dysfunction associated with type 2 diabetes mellitus ([place]) 08/29/2007  . Essential hypertension 08/29/2007  . PSEUDOFOLLICULITIS BARBAE [id number]    Silvestre Mesi 12/26/2019, 9:32 AM  [facility] Physical Therapy [address] [place], [place], [address] Phone: [phone]   Fax:  [phone]  Name: BROGAN DEZEEUW MRN: [id number] Date of Birth: 05/16/1956

## 2019-12-26 NOTE — Patient Instructions (Signed)
Access Code: [email or web address]  URL: [email or web address]  Date: 12/26/2019  Prepared by: Elsie Ra   Exercises  Supine Single Knee to Chest - 3 sets - 30 hold - 2x daily - 6x weekly  Supine Double Knee to Chest - 3 reps - 1 sets - 30 hold - 2x daily - 6x weekly  Seated Lumbar Flexion Stretch - 10-20 reps - 1 sets - 5 hold - 2x daily - 6x weekly  Supine Bridge - 10 reps - 1-2 sets - 5 hold - 2x daily - 6x weekly  Child's Pose Stretch - 3 sets - 30 hold - 2x daily - 6x weekly  Patient Education  TENS Therapy

## 2020-01-08 DIAGNOSIS — Z794 Long term (current) use of insulin: Secondary | ICD-10-CM | POA: Diagnosis not present

## 2020-01-08 DIAGNOSIS — E119 Type 2 diabetes mellitus without complications: Secondary | ICD-10-CM | POA: Diagnosis not present

## 2020-01-08 DIAGNOSIS — I10 Essential (primary) hypertension: Secondary | ICD-10-CM | POA: Diagnosis not present

## 2020-01-21 ENCOUNTER — Telehealth: Payer: Self-pay

## 2020-01-23 NOTE — Telephone Encounter (Signed)
PA approved. Will contact pt today.

## 2020-01-23 NOTE — Telephone Encounter (Signed)
Key: [id number]

## 2020-01-23 NOTE — Telephone Encounter (Signed)
Pt contacted and informed of same.  

## 2020-01-25 ENCOUNTER — Encounter: Payer: Federal, State, Local not specified - PPO | Admitting: Physical Therapy

## 2020-01-25 ENCOUNTER — Telehealth: Payer: Self-pay | Admitting: Physical Therapy

## 2020-01-25 NOTE — Telephone Encounter (Signed)
Spoke to pt as he NS for appt.  He reports "I'm doing fine now."  Will d/c PT and advised to call if he needed anything further.  Laureen Abrahams, PT, DPT 01/25/20 8:26 AM

## 2020-02-02 DIAGNOSIS — I10 Essential (primary) hypertension: Secondary | ICD-10-CM | POA: Diagnosis not present

## 2020-02-05 ENCOUNTER — Telehealth: Payer: Self-pay

## 2020-02-05 NOTE — Telephone Encounter (Signed)
Pt is requesting to add testosterone and vitamin d lab to lab work order that pt will do in June.   Please let me know if this can be done.

## 2020-02-05 NOTE — Telephone Encounter (Signed)
New message     The patient calls want to know if Vitamin D is included in the lab work order.

## 2020-02-06 ENCOUNTER — Telehealth: Payer: Self-pay | Admitting: Internal Medicine

## 2020-02-06 DIAGNOSIS — E119 Type 2 diabetes mellitus without complications: Secondary | ICD-10-CM

## 2020-02-06 DIAGNOSIS — R7989 Other specified abnormal findings of blood chemistry: Secondary | ICD-10-CM

## 2020-02-06 DIAGNOSIS — E559 Vitamin D deficiency, unspecified: Secondary | ICD-10-CM

## 2020-02-06 NOTE — Telephone Encounter (Signed)
Ok this is done 

## 2020-02-19 ENCOUNTER — Other Ambulatory Visit (INDEPENDENT_AMBULATORY_CARE_PROVIDER_SITE_OTHER): Payer: Federal, State, Local not specified - PPO

## 2020-02-19 DIAGNOSIS — E119 Type 2 diabetes mellitus without complications: Secondary | ICD-10-CM

## 2020-02-19 DIAGNOSIS — R7989 Other specified abnormal findings of blood chemistry: Secondary | ICD-10-CM | POA: Diagnosis not present

## 2020-02-19 DIAGNOSIS — E559 Vitamin D deficiency, unspecified: Secondary | ICD-10-CM | POA: Diagnosis not present

## 2020-02-19 LAB — HEPATIC FUNCTION PANEL
ALT: 49 U/L (ref 0–53)
AST: 36 U/L (ref 0–37)
Albumin: 4.4 g/dL (ref 3.5–5.2)
Alkaline Phosphatase: 51 U/L (ref 39–117)
Bilirubin, Direct: 0 mg/dL (ref 0.0–0.3)
Total Bilirubin: 0.4 mg/dL (ref 0.2–1.2)
Total Protein: 7.6 g/dL (ref 6.0–8.3)

## 2020-02-19 LAB — BASIC METABOLIC PANEL
BUN: 15 mg/dL (ref 6–23)
CO2: 29 mEq/L (ref 19–32)
Calcium: 9.5 mg/dL (ref 8.4–10.5)
Chloride: 105 mEq/L (ref 96–112)
Creatinine, Ser: 0.91 mg/dL (ref 0.40–1.50)
GFR: 101.85 mL/min (ref >60.00)
Glucose, Bld: 150 mg/dL — ABNORMAL HIGH (ref 70–99)
Potassium: 4.3 mEq/L (ref 3.5–5.1)
Sodium: 139 mEq/L (ref 135–145)

## 2020-02-19 LAB — LIPID PANEL
Cholesterol: 123 mg/dL (ref 0–200)
HDL: 36.5 mg/dL — ABNORMAL LOW (ref >39.00)
LDL Cholesterol: 74 mg/dL (ref 0–99)
NonHDL: 86.94
Total CHOL/HDL Ratio: 3
Triglycerides: 63 mg/dL (ref 0.0–149.0)
VLDL: 12.6 mg/dL (ref 0.0–40.0)

## 2020-02-19 LAB — VITAMIN D 25 HYDROXY (VIT D DEFICIENCY, FRACTURES): VITD: 37.98 ng/mL (ref 30.00–100.00)

## 2020-02-19 LAB — TESTOSTERONE: Testosterone: 134.47 ng/dL — ABNORMAL LOW (ref 300.00–890.00)

## 2020-02-19 LAB — HEMOGLOBIN A1C: Hgb A1c MFr Bld: 9.5 % — ABNORMAL HIGH (ref 4.6–6.5)

## 2020-02-23 ENCOUNTER — Other Ambulatory Visit: Payer: Self-pay | Admitting: Internal Medicine

## 2020-02-23 MED ORDER — TRULICITY 3 MG/0.5ML ~~LOC~~ SOAJ: 3.0000 mg | SUBCUTANEOUS | 3 refills | Status: DC

## 2020-02-26 ENCOUNTER — Other Ambulatory Visit: Payer: Self-pay | Admitting: Internal Medicine

## 2020-04-08 DIAGNOSIS — E113299 Type 2 diabetes mellitus with mild nonproliferative diabetic retinopathy without macular edema, unspecified eye: Secondary | ICD-10-CM | POA: Diagnosis not present

## 2020-04-08 DIAGNOSIS — E1165 Type 2 diabetes mellitus with hyperglycemia: Secondary | ICD-10-CM | POA: Diagnosis not present

## 2020-04-08 DIAGNOSIS — Z794 Long term (current) use of insulin: Secondary | ICD-10-CM | POA: Diagnosis not present

## 2020-04-14 DIAGNOSIS — E039 Hypothyroidism, unspecified: Secondary | ICD-10-CM | POA: Diagnosis not present

## 2020-04-14 DIAGNOSIS — E119 Type 2 diabetes mellitus without complications: Secondary | ICD-10-CM | POA: Diagnosis not present

## 2020-04-14 DIAGNOSIS — L853 Xerosis cutis: Secondary | ICD-10-CM | POA: Diagnosis not present

## 2020-04-14 DIAGNOSIS — R0789 Other chest pain: Secondary | ICD-10-CM | POA: Diagnosis not present

## 2020-04-15 ENCOUNTER — Encounter: Payer: Self-pay | Admitting: Orthopaedic Surgery

## 2020-04-15 ENCOUNTER — Other Ambulatory Visit: Payer: Self-pay

## 2020-04-15 ENCOUNTER — Ambulatory Visit: Payer: Federal, State, Local not specified - PPO | Admitting: Orthopaedic Surgery

## 2020-04-15 DIAGNOSIS — M48061 Spinal stenosis, lumbar region without neurogenic claudication: Secondary | ICD-10-CM | POA: Diagnosis not present

## 2020-04-15 NOTE — Progress Notes (Signed)
Office Visit Note   Patient: [name]           Date of Birth: October 11, 1956           MRN: [id number] Visit Date: 04/15/2020              Requested by: Biagio Borg, MD [place],  [place] [address] PCP: Biagio Borg, MD   Assessment & Plan: Visit Diagnoses:  1. Spinal stenosis of lumbar region without neurogenic claudication     Plan: Patient has transitional anatomy and L4-5 has grade 1 anterolisthesis with severe central stenosis and moderate to severe bilateral lateral recess stenosis with moderate foraminal stenosis.  His leg pain is better he is amatory in the community without symptoms no claudication symptoms.  We went over carefully signs of neurogenic claudication as well as radiculopathy.  Recheck 6 months.  If he has recurrent symptoms he can return earlier.  Today's visit we reviewed plain radiographs as well as MRI again and discussed pathophysiology.  Follow-Up Instructions: Return in about 6 months (around 10/16/2020).   Orders:  No orders of the defined types were placed in this encounter.  No orders of the defined types were placed in this encounter.     Procedures: No procedures performed   Clinical Data: No additional findings.   Subjective: Chief Complaint  Patient presents with  . Lower Back - Follow-up    HPI 64 year old male returns for follow-up with lumbar stenosis L4-5 and degenerative anterolisthesis.  Patient has significant right radicular leg symptoms on previous visit.  Some prednisone had been given he has been through some therapy and states he has been tremendously relieved and is getting around better.  Patient had a combination of short pedicles plus and required stenosis with hypertrophic facets and multifactorial severe stenosis.  Review of Systems reviewed updated unchanged from 12/11/2019 office visit.   Objective: Vital Signs: BP (!) 153/98   Pulse 79   Ht 6\' 2"  (1.88 m)   Wt 265 lb (120.2 kg)   BMI  34.02 kg/m   Physical Exam Constitutional:      Appearance: He is well-developed.  HENT:     Head: Normocephalic and atraumatic.  Eyes:     Pupils: Pupils are equal, round, and reactive to light.  Neck:     Thyroid: No thyromegaly.     Trachea: No tracheal deviation.  Cardiovascular:     Rate and Rhythm: Normal rate.  Pulmonary:     Effort: Pulmonary effort is normal.     Breath sounds: No wheezing.  Abdominal:     General: Bowel sounds are normal.     Palpations: Abdomen is soft.  Skin:    General: Skin is warm and dry.     Capillary Refill: Capillary refill takes less than 2 seconds.  Neurological:     Mental Status: He is alert and oriented to person, place, and time.  Psychiatric:        Behavior: Behavior normal.        Thought Content: Thought content normal.        Judgment: Judgment normal.     Ortho Exam patient is amatory without a limp negative straight leg raising 90 degrees.  Gastrocsoleus anterior tib is strong.  Negative logroll the hips.  Specialty Comments:  No specialty comments available.  Imaging: No results found.   PMFS History: Patient Active Problem List   Diagnosis Date Noted  . Spinal stenosis of lumbar  region 04/15/2020  . Pain and swelling of right lower leg 10/30/2019  . Right lumbar radiculopathy 10/30/2019  . Hearing loss 12/21/2018  . Obstructive sleep apnea 12/18/2018  . History of colon polyps 08/24/2017  . Hyperlipidemia 07/14/2016  . Hematuria 09/06/2014  . Hypogonadism male 03/08/2014  . Myalgia and myositis, unspecified 10/22/2012  . Encounter for well adult exam with abnormal findings 10/22/2012  . PULMONARY NODULE, RIGHT UPPER LOBE 02/10/2009  . Obesity, Class I, BMI 30-34.9 09/26/2008  . Nonspecific (abnormal) findings on radiological and other examination of body structure 07/04/2008  . Diabetes ([place]) 08/29/2007  . Erectile dysfunction associated with type 2 diabetes mellitus ([place]) 08/29/2007  . Essential  hypertension 08/29/2007  . PSEUDOFOLLICULITIS BARBAE [id number]   Past Medical History:  Diagnosis Date  . ABNORMAL CHEST XRAY 2009  . Biceps tendon rupture   . DIABETES MELLITUS, TYPE II   . ERECTILE DYSFUNCTION, MILD   . Hyperlipidemia 07/14/2016   History, normal test results in 09/2018  . HYPERTENSION   . Hypogonadism male 03/08/2014  . Hypogonadism, male   . Overweight(278.02)   . PSEUDOFOLLICULITIS BARBAE   . PULMONARY NODULE, RIGHT UPPER LOBE 02/10/2009   MD just watching    Family History  Problem Relation Age of Onset  . Hypertension Mother   . Diabetes Mother   . Kidney failure Father   . Cancer Brother        Pancreatic  . Coronary artery disease Neg Hx   . Colon cancer Neg Hx   . Rectal cancer Neg Hx   . Stomach cancer Neg Hx     Past Surgical History:  Procedure Laterality Date  . COLONOSCOPY  11/2008   Ardis Hughs poylps  . eye lid surgey Left   . NASAL POLYP SURGERY    . NASAL POLYP SURGERY  1980s  . WISDOM TOOTH EXTRACTION     Social History   Occupational History  . Occupation: WORKS [employer]    Employer: [employer]  Tobacco Use  . Smoking status: Former Smoker    Packs/day: 1.00    Years: 18.00    Pack years: 18.00    Types: Cigarettes    Quit date: 10/20/1991    Years since quitting: 28.5  . Smokeless tobacco: Never Used  Substance and Sexual Activity  . Alcohol use: No  . Drug use: No  . Sexual activity: Yes    Partners: Female

## 2020-04-16 ENCOUNTER — Ambulatory Visit: Payer: Federal, State, Local not specified - PPO | Admitting: Internal Medicine

## 2020-04-30 DIAGNOSIS — B351 Tinea unguium: Secondary | ICD-10-CM | POA: Diagnosis not present

## 2020-04-30 DIAGNOSIS — E1140 Type 2 diabetes mellitus with diabetic neuropathy, unspecified: Secondary | ICD-10-CM | POA: Diagnosis not present

## 2020-04-30 DIAGNOSIS — L84 Corns and callosities: Secondary | ICD-10-CM | POA: Diagnosis not present

## 2020-05-09 DIAGNOSIS — I10 Essential (primary) hypertension: Secondary | ICD-10-CM | POA: Diagnosis not present

## 2020-05-09 DIAGNOSIS — Z6833 Body mass index (BMI) 33.0-33.9, adult: Secondary | ICD-10-CM | POA: Diagnosis not present

## 2020-05-09 DIAGNOSIS — E119 Type 2 diabetes mellitus without complications: Secondary | ICD-10-CM | POA: Diagnosis not present

## 2020-05-09 DIAGNOSIS — Z713 Dietary counseling and surveillance: Secondary | ICD-10-CM | POA: Diagnosis not present

## 2020-05-09 DIAGNOSIS — E669 Obesity, unspecified: Secondary | ICD-10-CM | POA: Diagnosis not present

## 2020-05-21 DIAGNOSIS — Z794 Long term (current) use of insulin: Secondary | ICD-10-CM | POA: Diagnosis not present

## 2020-05-21 DIAGNOSIS — I25119 Atherosclerotic heart disease of native coronary artery with unspecified angina pectoris: Secondary | ICD-10-CM | POA: Diagnosis not present

## 2020-05-21 DIAGNOSIS — I10 Essential (primary) hypertension: Secondary | ICD-10-CM | POA: Diagnosis not present

## 2020-05-21 DIAGNOSIS — E119 Type 2 diabetes mellitus without complications: Secondary | ICD-10-CM | POA: Diagnosis not present

## 2020-06-11 ENCOUNTER — Ambulatory Visit: Payer: Federal, State, Local not specified - PPO | Admitting: Internal Medicine

## 2020-06-11 ENCOUNTER — Encounter: Payer: Self-pay | Admitting: Internal Medicine

## 2020-06-11 ENCOUNTER — Other Ambulatory Visit: Payer: Self-pay

## 2020-06-11 VITALS — BP 150/90 | HR 78 | Temp 98.9°F | Ht 74.0 in | Wt 262.0 lb

## 2020-06-11 DIAGNOSIS — Z0000 Encounter for general adult medical examination without abnormal findings: Secondary | ICD-10-CM | POA: Diagnosis not present

## 2020-06-11 DIAGNOSIS — E559 Vitamin D deficiency, unspecified: Secondary | ICD-10-CM | POA: Diagnosis not present

## 2020-06-11 DIAGNOSIS — E119 Type 2 diabetes mellitus without complications: Secondary | ICD-10-CM | POA: Diagnosis not present

## 2020-06-11 DIAGNOSIS — Z0001 Encounter for general adult medical examination with abnormal findings: Secondary | ICD-10-CM

## 2020-06-11 DIAGNOSIS — E538 Deficiency of other specified B group vitamins: Secondary | ICD-10-CM | POA: Diagnosis not present

## 2020-06-11 MED ORDER — OZEMPIC (1 MG/DOSE) 2 MG/1.5ML ~~LOC~~ SOPN: 1.0000 mg | PEN_INJECTOR | SUBCUTANEOUS | 11 refills | Status: DC

## 2020-06-11 MED ORDER — FREESTYLE LIBRE 14 DAY SENSOR MISC: 1.0000 | 3 refills | Status: DC

## 2020-06-11 NOTE — Patient Instructions (Addendum)
Please change to OTC Vitamin D3 at 2000 units per day, indefinitely.  Please continue all other medications as before, and refills have been done if requested.  Please have the pharmacy call with any other refills you may need.  Please continue your efforts at being more active, low cholesterol diet, and weight control.  You are otherwise up to date with prevention measures today.  Please keep your appointments with your specialists as you may have planned  Please go to the LAB at the blood drawing area for the tests to be done  You will be contacted by phone if any changes need to be made immediately.  Otherwise, you will receive a letter about your results with an explanation, but please check with MyChart first.  Please remember to sign up for MyChart if you have not done so, as this will be important to you in the future with finding out test results, communicating by private email, and scheduling acute appointments online when needed.  Please make an Appointment to return in 6 months, or sooner if needed

## 2020-06-11 NOTE — Progress Notes (Signed)
Subjective:    Patient ID: [name], male    DOB: 1956-02-28, 64 y.o.   MRN: [id number]  HPI  Here for wellness and f/u;  Overall doing ok;  Pt denies Chest pain, worsening SOB, DOE, wheezing, orthopnea, PND, worsening LE edema, palpitations, dizziness or syncope.  Pt denies neurological change such as new headache, facial or extremity weakness.  Pt denies polydipsia, polyuria, or low sugar symptoms. Pt states overall good compliance with treatment and medications, good tolerability, and has been trying to follow appropriate diet.  Pt denies worsening depressive symptoms, suicidal ideation or panic. No fever, night sweats, wt loss, loss of appetite, or other constitutional symptoms.  Pt states good ability with ADL's, has low fall risk, home safety reviewed and adequate, no other significant changes in hearing or vision, and only occasionally active with exercise.  Sees Endo at [place].  No new complaints  Has not taken bP med today Past Medical History:  Diagnosis Date  . ABNORMAL CHEST XRAY 2009  . Biceps tendon rupture   . DIABETES MELLITUS, TYPE II   . ERECTILE DYSFUNCTION, MILD   . Hyperlipidemia 07/14/2016   History, normal test results in 09/2018  . HYPERTENSION   . Hypogonadism male 03/08/2014  . Hypogonadism, male   . Overweight(278.02)   . PSEUDOFOLLICULITIS BARBAE   . PULMONARY NODULE, RIGHT UPPER LOBE 02/10/2009   MD just watching   Past Surgical History:  Procedure Laterality Date  . COLONOSCOPY  11/2008   Ardis Hughs poylps  . eye lid surgey Left   . NASAL POLYP SURGERY    . NASAL POLYP SURGERY  1980s  . WISDOM TOOTH EXTRACTION      reports that he quit smoking about 28 years ago. His smoking use included cigarettes. He has a 18.00 pack-year smoking history. He has never used smokeless tobacco. He reports that he does not drink alcohol and does not use drugs. family history includes Cancer in his brother; Diabetes in his mother; Hypertension in his mother; Kidney failure in  his father. No Known Allergies Current Outpatient Medications on File Prior to Visit  Medication Sig Dispense Refill  . carvedilol (COREG) 25 MG tablet     . chlorthalidone (HYGROTON) 25 MG tablet Take 25 mg by mouth daily.    . clindamycin (CLEOCIN T) 1 % external solution APPLY EXTERNALLY TO THE AFFECTED AREA TWICE DAILY 60 mL 5  . Continuous Blood Gluc Receiver (FREESTYLE LIBRE 14 DAY READER) DEVI 1 Device by Does not apply route daily. Use as directed once daily E11.9 1 Device 0  . isosorbide mononitrate (IMDUR) 30 MG 24 hr tablet Take 15 mg by mouth daily.    Marland Kitchen losartan (COZAAR) 100 MG tablet Take 100 mg by mouth daily.    Marland Kitchen testosterone (ANDROGEL) 50 MG/5GM (1%) GEL PLACE 5 GRAMS ONTO THE SKIN DAILY 450 g 1  . irbesartan (AVAPRO) 150 MG tablet TAKE 1 TABLET BY MOUTH EVERY DAY (Patient not taking: Reported on 06/11/2020) 90 tablet 1  . rosuvastatin (CRESTOR) 40 MG tablet Take 40 mg by mouth daily.     No current facility-administered medications on file prior to visit.   Review of Systems All otherwise neg per pt    Objective:   Physical Exam BP (!) 150/90 (BP Location: Right Arm, Patient Position: Sitting, Cuff Size: Large)   Pulse 78   Temp 98.9 F (37.2 C) (Oral)   Ht 6\' 2"  (1.88 m)   Wt 262 lb (118.8 kg)  SpO2 97%   BMI 33.64 kg/m  VS noted,  Constitutional: Pt appears in NAD HENT: Head: NCAT.  Right Ear: External ear normal.  Left Ear: External ear normal.  Eyes: . Pupils are equal, round, and reactive to light. Conjunctivae and EOM are normal Nose: without d/c or deformity Neck: Neck supple. Gross normal ROM Cardiovascular: Normal rate and regular rhythm.   Pulmonary/Chest: Effort normal and breath sounds without rales or wheezing.  Abd:  Soft, NT, ND, + BS, no organomegaly Neurological: Pt is alert. At baseline orientation, motor grossly intact Skin: Skin is warm. No rashes, other new lesions, no LE edema Psychiatric: Pt behavior is normal without agitation   All otherwise neg per pt Lab Results  Component Value Date   WBC 5.8 06/11/2020   HGB 12.0 (L) 06/11/2020   HCT 39.1 06/11/2020   PLT 158 06/11/2020   GLUCOSE 178 (H) 06/11/2020   CHOL 131 06/11/2020   TRIG 73 06/11/2020   HDL 41 06/11/2020   LDLCALC 75 06/11/2020   ALT 48 (H) 06/11/2020   AST 37 (H) 06/11/2020   NA 141 06/11/2020   K 4.7 06/11/2020   CL 105 06/11/2020   CREATININE 0.95 06/11/2020   BUN 12 06/11/2020   CO2 31 06/11/2020   TSH 1.70 06/11/2020   PSA 0.7 06/11/2020   HGBA1C 9.2 (H) 06/11/2020   MICROALBUR 3.3 06/11/2020      Assessment & Plan:

## 2020-06-12 ENCOUNTER — Other Ambulatory Visit: Payer: Self-pay | Admitting: Internal Medicine

## 2020-06-12 ENCOUNTER — Encounter: Payer: Self-pay | Admitting: Internal Medicine

## 2020-06-12 LAB — CBC WITH DIFFERENTIAL/PLATELET
Absolute Monocytes: 423 cells/uL (ref 200–950)
Basophils Absolute: 17 cells/uL (ref 0–200)
Basophils Relative: 0.3 %
Eosinophils Absolute: 151 cells/uL (ref 15–500)
Eosinophils Relative: 2.6 %
HCT: 39.1 % (ref 38.5–50.0)
Hemoglobin: 12 g/dL — ABNORMAL LOW (ref 13.2–17.1)
Lymphs Abs: 2627 cells/uL (ref 850–3900)
MCH: 24 pg — ABNORMAL LOW (ref 27.0–33.0)
MCHC: 30.7 g/dL — ABNORMAL LOW (ref 32.0–36.0)
MCV: 78.2 fL — ABNORMAL LOW (ref 80.0–100.0)
MPV: 12.8 fL — ABNORMAL HIGH (ref 7.5–12.5)
Monocytes Relative: 7.3 %
Neutro Abs: 2581 cells/uL (ref 1500–7800)
Neutrophils Relative %: 44.5 %
Platelets: 158 10*3/uL (ref 140–400)
RBC: 5 10*6/uL (ref 4.20–5.80)
RDW: 14.2 % (ref 11.0–15.0)
Total Lymphocyte: 45.3 %
WBC: 5.8 10*3/uL (ref 3.8–10.8)

## 2020-06-12 LAB — MICROALBUMIN / CREATININE URINE RATIO
Creatinine, Urine: 226 mg/dL (ref 20–320)
Microalb Creat Ratio: 15 mcg/mg creat (ref <30)
Microalb, Ur: 3.3 mg/dL

## 2020-06-12 LAB — URINALYSIS, ROUTINE W REFLEX MICROSCOPIC
Bilirubin Urine: NEGATIVE
Hgb urine dipstick: NEGATIVE
Ketones, ur: NEGATIVE
Leukocytes,Ua: NEGATIVE
Nitrite: NEGATIVE
Protein, ur: NEGATIVE
Specific Gravity, Urine: 1.026 (ref 1.001–1.03)
pH: 5.5 (ref 5.0–8.0)

## 2020-06-12 LAB — HEPATIC FUNCTION PANEL
AG Ratio: 1.5 (calc) (ref 1.0–2.5)
ALT: 48 U/L — ABNORMAL HIGH (ref 9–46)
AST: 37 U/L — ABNORMAL HIGH (ref 10–35)
Albumin: 4.4 g/dL (ref 3.6–5.1)
Alkaline phosphatase (APISO): 50 U/L (ref 35–144)
Bilirubin, Direct: 0.1 mg/dL (ref 0.0–0.2)
Globulin: 2.9 g/dL (calc) (ref 1.9–3.7)
Indirect Bilirubin: 0.4 mg/dL (calc) (ref 0.2–1.2)
Total Bilirubin: 0.5 mg/dL (ref 0.2–1.2)
Total Protein: 7.3 g/dL (ref 6.1–8.1)

## 2020-06-12 LAB — HEMOGLOBIN A1C
Hgb A1c MFr Bld: 9.2 % of total Hgb — ABNORMAL HIGH (ref <5.7)
Mean Plasma Glucose: 217 (calc)
eAG (mmol/L): 12 (calc)

## 2020-06-12 LAB — LIPID PANEL
Cholesterol: 131 mg/dL (ref <200)
HDL: 41 mg/dL (ref >=40)
LDL Cholesterol (Calc): 75 mg/dL (calc)
Non-HDL Cholesterol (Calc): 90 mg/dL (calc) (ref <130)
Total CHOL/HDL Ratio: 3.2 (calc) (ref <5.0)
Triglycerides: 73 mg/dL (ref <150)

## 2020-06-12 LAB — BASIC METABOLIC PANEL
BUN: 12 mg/dL (ref 7–25)
CO2: 31 mmol/L (ref 20–32)
Calcium: 9.3 mg/dL (ref 8.6–10.3)
Chloride: 105 mmol/L (ref 98–110)
Creat: 0.95 mg/dL (ref 0.70–1.25)
Glucose, Bld: 178 mg/dL — ABNORMAL HIGH (ref 65–99)
Potassium: 4.7 mmol/L (ref 3.5–5.3)
Sodium: 141 mmol/L (ref 135–146)

## 2020-06-12 LAB — VITAMIN B12: Vitamin B-12: 377 pg/mL (ref 200–1100)

## 2020-06-12 LAB — PSA: PSA: 0.7 ng/mL (ref <=4.0)

## 2020-06-12 LAB — TSH: TSH: 1.7 mIU/L (ref 0.40–4.50)

## 2020-06-12 LAB — VITAMIN D 25 HYDROXY (VIT D DEFICIENCY, FRACTURES): Vit D, 25-Hydroxy: 20 ng/mL — ABNORMAL LOW (ref 30–100)

## 2020-06-12 MED ORDER — METFORMIN HCL 1000 MG PO TABS: 1000.0000 mg | ORAL_TABLET | Freq: Two times a day (BID) | ORAL | 3 refills | Status: AC

## 2020-06-12 MED ORDER — OZEMPIC (1 MG/DOSE) 2 MG/1.5ML ~~LOC~~ SOPN: 1.0000 mg | PEN_INJECTOR | SUBCUTANEOUS | 11 refills | Status: DC

## 2020-06-12 MED ORDER — GLIMEPIRIDE 4 MG PO TABS: 4.0000 mg | ORAL_TABLET | Freq: Every day | ORAL | 3 refills | Status: DC

## 2020-06-12 MED ORDER — VITAMIN D (ERGOCALCIFEROL) 1.25 MG (50000 UNIT) PO CAPS: 50000.0000 [IU] | ORAL_CAPSULE | ORAL | 0 refills | Status: DC

## 2020-06-15 ENCOUNTER — Encounter: Payer: Self-pay | Admitting: Internal Medicine

## 2020-06-15 NOTE — Assessment & Plan Note (Signed)
stable overall by history and exam, recent data reviewed with pt, and pt to continue medical treatment as before,  to f/u any worsening symptoms or concerns  

## 2020-06-15 NOTE — Assessment & Plan Note (Signed)

## 2020-06-18 NOTE — Telephone Encounter (Signed)
   Please call patient to discuss results

## 2020-06-25 ENCOUNTER — Telehealth: Payer: Self-pay

## 2020-06-25 DIAGNOSIS — Z0279 Encounter for issue of other medical certificate: Secondary | ICD-10-CM

## 2020-06-25 NOTE — Telephone Encounter (Signed)
New message    Calling for test results  

## 2020-06-26 NOTE — Telephone Encounter (Signed)
LDVM of Dr. Gwynn Burly note about pts labs results and new meds.

## 2020-07-05 IMAGING — MR MR LUMBAR SPINE W/O CM
4 of 5 series · 18 of 48 positions shown · non-contrast
Comparison: Lumbar radiographs 11/20/2016. chest CT 02/17/2009.

CLINICAL DATA: 63-year-old male with lumbar radiculopathy. Pain
radiating to the right anterior leg for 3 weeks with no known
injury.

EXAM:
MRI LUMBAR SPINE WITHOUT CONTRAST
TECHNIQUE: Multiplanar, multisequence MR imaging of the lumbar spine was
performed. No intravenous contrast was administered.

[Series 5: T2 · sagittal · 4.0mm · 0.73mm/px · 6 of 15 slices shown (1 of 2)]
[im 1/15]
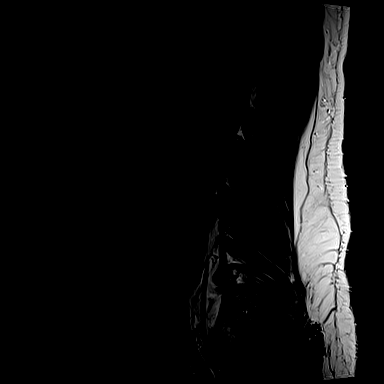
[im 3/15]
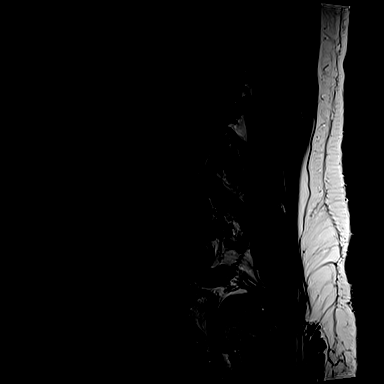
[im 6/15]
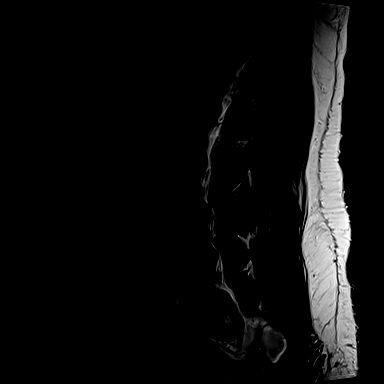
[im 9/15]
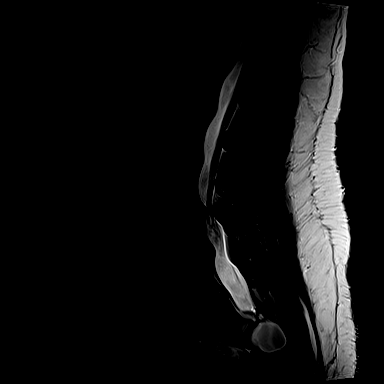
[im 12/15]
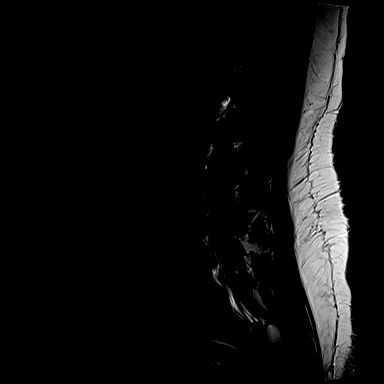
[im 15/15]
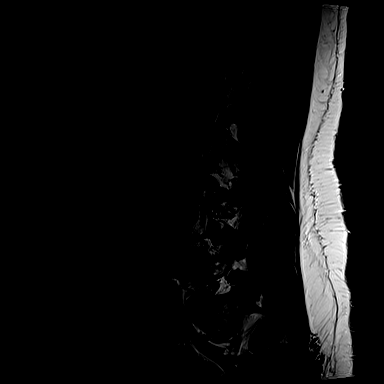

[Series 6: T1 · sagittal · 4.0mm · 0.73mm/px · 3 of 15 slices shown (1 of 2)]
[im 1/15]
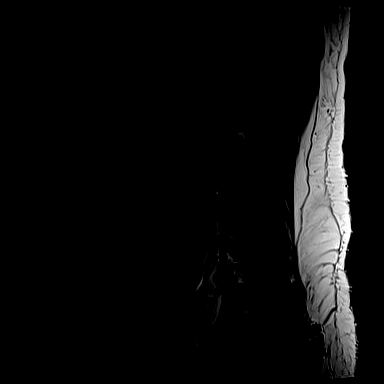
[im 8/15]
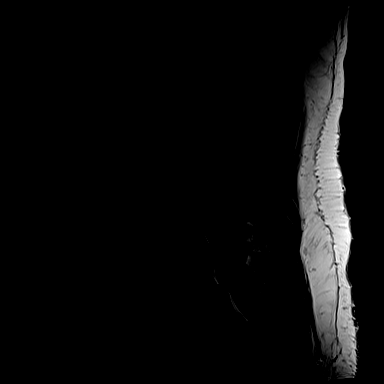
[im 15/15]
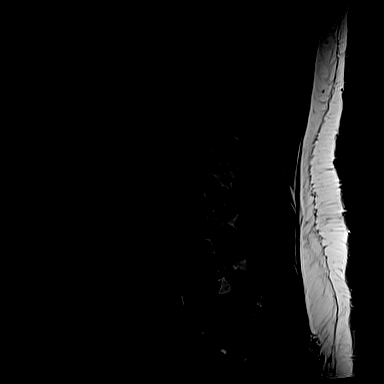

[Series 10: T1 · axial · 4.0mm · 0.28mm/px · z∈[-107,+82]mm · 3 of 44 slices shown (2 of 2)]
[im 6/44]
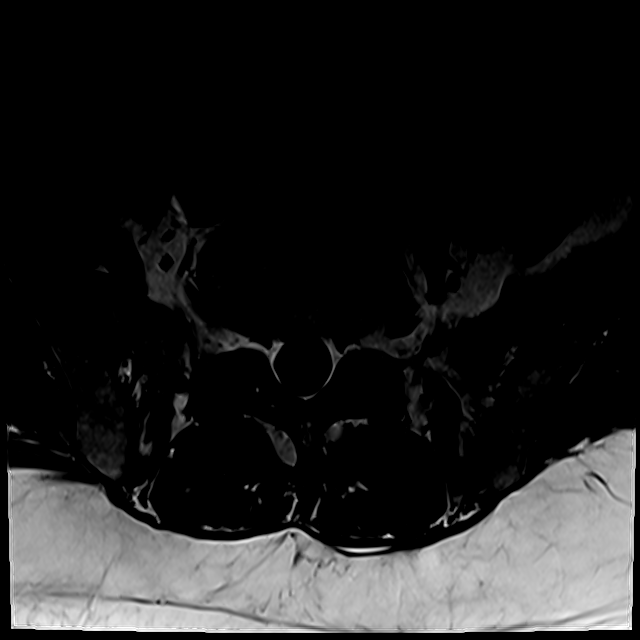
[im 23/44]
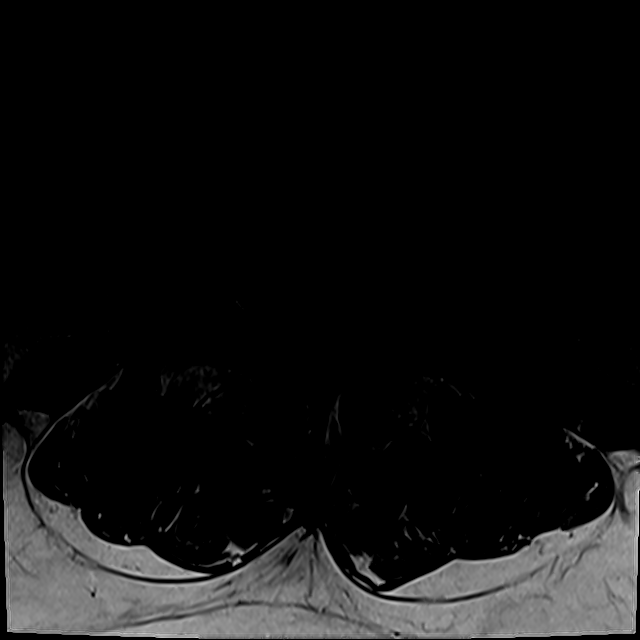
[im 38/44]
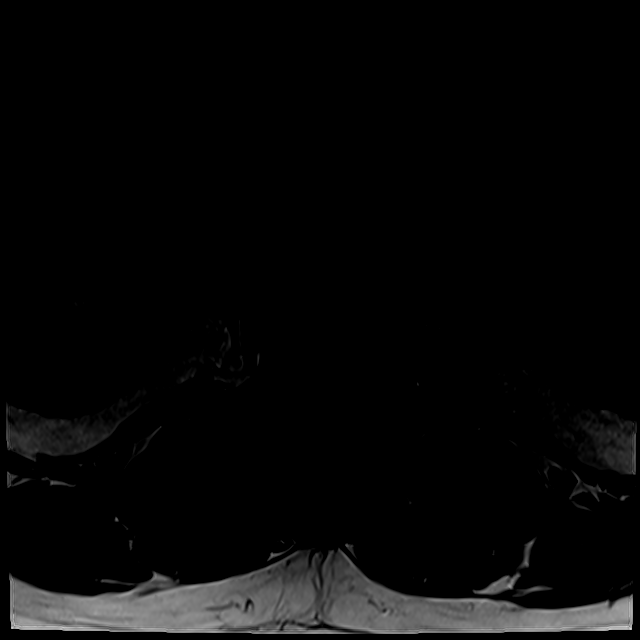

[Series 13: T2 · axial · 4.0mm · 0.28mm/px · z∈[-122,+82]mm · 6 of 44 slices shown (2 of 2)]
[im 3/44]
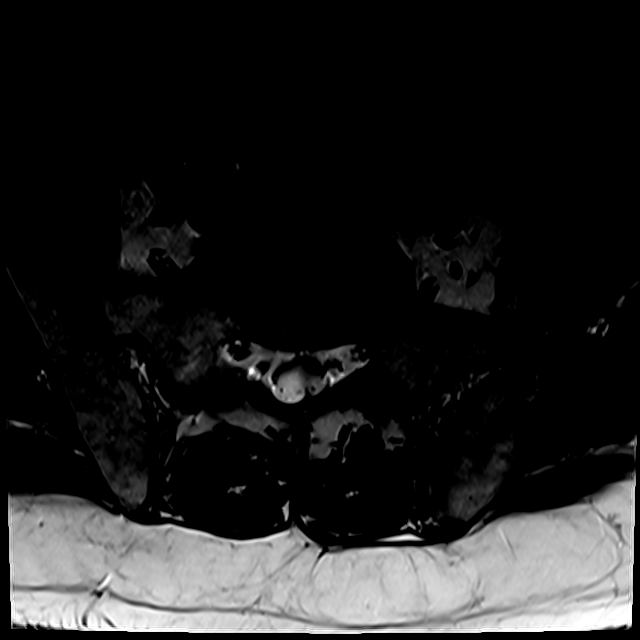
[im 6/44]
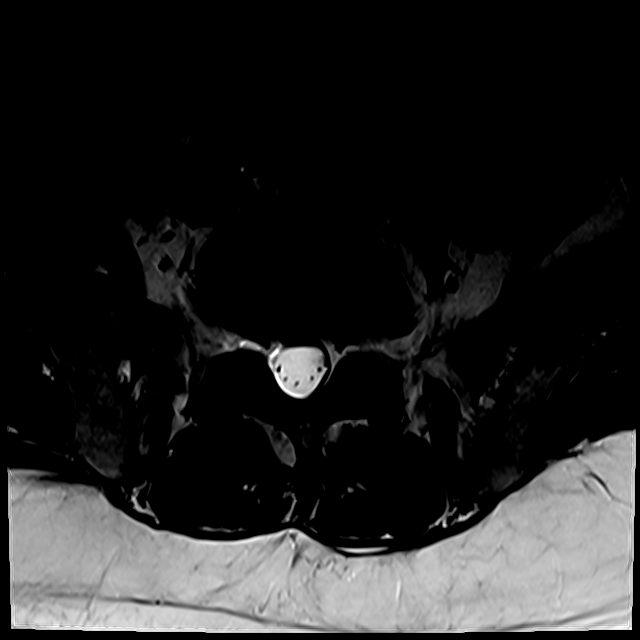
[im 9/44]
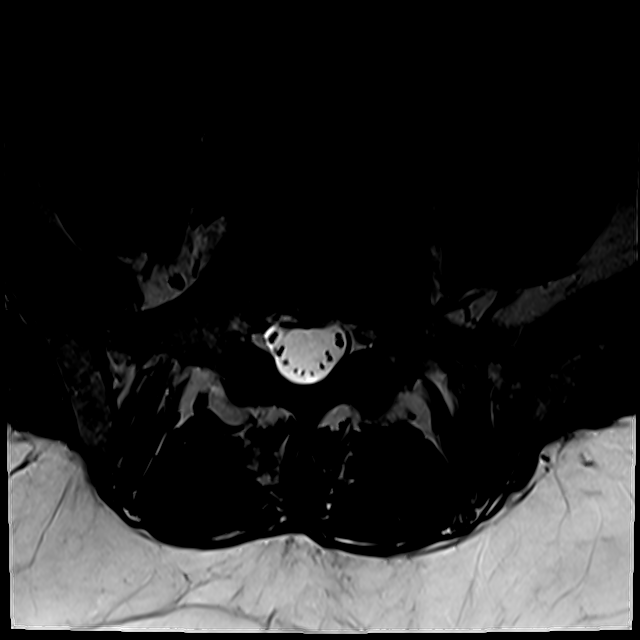
[im 15/44]
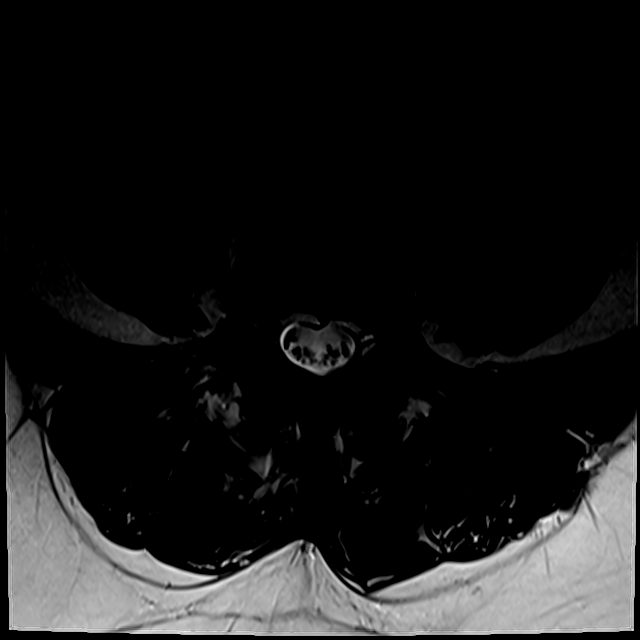
[im 23/44]
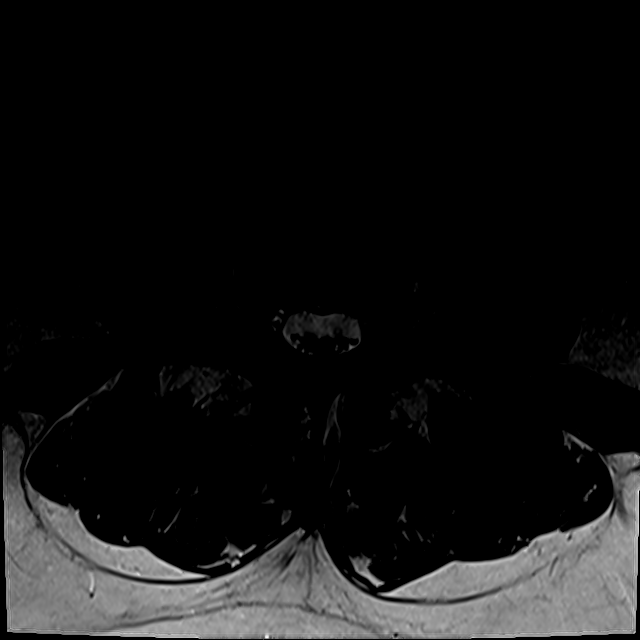
[im 38/44]
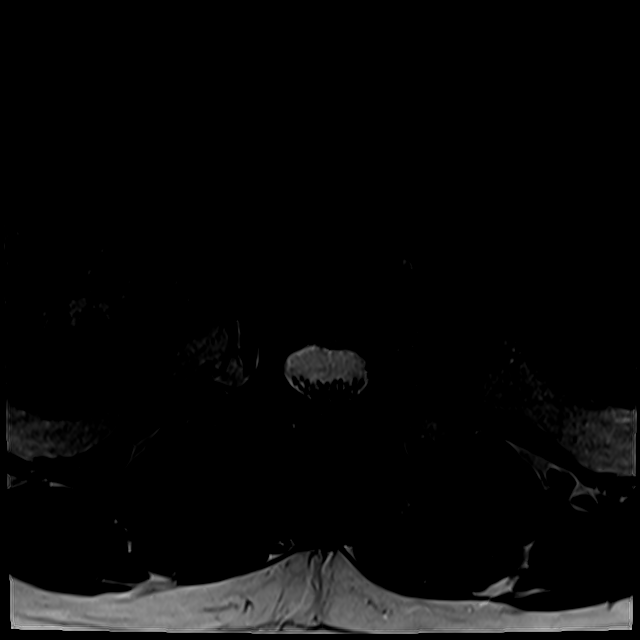

[18 of 48 positions shown; findings below may reference images not displayed]

FINDINGS: Segmentation: 12 full size ribs on the comparison chest CT. Six
lumbarized vertebral bodies, therefore S1 we designated as fully
lumbarized. Correlation with radiographs is recommended prior to any
operative intervention.

Alignment: Preserved lumbar lordosis, but new grade 1
anterolisthesis of L4 on L5 since 4312 measuring 3-4 millimeters.
Mild retrolisthesis at L5-S1 also appears increased. Slight
levoconvex lower lumbar scoliosis.

Vertebrae: No marrow edema or evidence of acute osseous abnormality.
Visualized bone marrow signal is within normal limits. Incidental S3
sacral Tarlov cyst (normal variant). Intact visible sacrum and SI
joints.

Conus medullaris and cauda equina: Conus extends to the L1 level. No
lower spinal cord or conus signal abnormality.

Paraspinal and other soft tissues: Negative.

Disc levels:

T12-L1:  Negative.

L1-L2:  Negative.

L2-L3:  Negative.

L3-L4: Mild far lateral disc bulging. Mild to moderate posterior
element hypertrophy. Borderline to mild bilateral L3 foraminal
stenosis.

L4-L5: Grade 1 anterolisthesis. Disc space loss and circumferential
disc bulge with broad-based posterior disc or pseudo disc.
Broad-based foraminal involvement greater on the left. Moderate to
severe facet and ligament flavum hypertrophy.

Severe spinal stenosis. Moderate to severe bilateral lateral recess
stenosis. Moderate to severe left and mild to moderate right L4
foraminal stenosis.

L5-S1: Mild retrolisthesis. Disc space loss with circumferential
disc bulge and endplate spurring. Broad-based posterior and
biforaminal involvement. Moderate posterior element hypertrophy.
Borderline to mild bilateral lateral recess stenosis with no spinal
stenosis. Moderate left and mild to moderate right L5 foraminal
stenosis.

S1-S2: Lumbarized. Mild circumferential disc bulge and endplate
spurring. Mild posterior element hypertrophy. Mild bilateral S1
foraminal stenosis.
IMPRESSION: 1. Transitional lumbosacral anatomy. Twelve full size ribs on a
prior chest CT, such that with 6 lumbarized levels the S1 level is
designated as fully lumbarized. Correlation with radiographs is
recommended prior to any operative intervention.
2. Grade 1 anterolisthesis at L4-L5 with disc and severe posterior
element degeneration. Subsequent severe spinal stenosis, moderate to
severe bilateral lateral recess stenosis, and moderate to severe
left greater than right foraminal stenosis.
3. L5-S1 mild retrolisthesis with multifactorial mild lateral recess
stenosis and moderate bilateral foraminal stenosis.

## 2020-07-08 DIAGNOSIS — Z794 Long term (current) use of insulin: Secondary | ICD-10-CM | POA: Diagnosis not present

## 2020-07-08 DIAGNOSIS — E119 Type 2 diabetes mellitus without complications: Secondary | ICD-10-CM | POA: Diagnosis not present

## 2020-07-29 DIAGNOSIS — L603 Nail dystrophy: Secondary | ICD-10-CM | POA: Diagnosis not present

## 2020-09-12 DIAGNOSIS — Z7984 Long term (current) use of oral hypoglycemic drugs: Secondary | ICD-10-CM | POA: Diagnosis not present

## 2020-09-12 DIAGNOSIS — R42 Dizziness and giddiness: Secondary | ICD-10-CM | POA: Diagnosis not present

## 2020-09-12 DIAGNOSIS — Z794 Long term (current) use of insulin: Secondary | ICD-10-CM | POA: Diagnosis not present

## 2020-09-12 DIAGNOSIS — E119 Type 2 diabetes mellitus without complications: Secondary | ICD-10-CM | POA: Diagnosis not present

## 2020-09-26 ENCOUNTER — Encounter: Payer: Self-pay | Admitting: Internal Medicine

## 2020-09-26 ENCOUNTER — Ambulatory Visit: Payer: Federal, State, Local not specified - PPO | Admitting: Internal Medicine

## 2020-09-26 ENCOUNTER — Other Ambulatory Visit: Payer: Self-pay

## 2020-09-26 VITALS — BP 160/102 | HR 82 | Temp 99.0°F | Ht 74.0 in | Wt 260.0 lb

## 2020-09-26 DIAGNOSIS — I10 Essential (primary) hypertension: Secondary | ICD-10-CM

## 2020-09-26 DIAGNOSIS — J069 Acute upper respiratory infection, unspecified: Secondary | ICD-10-CM

## 2020-09-26 DIAGNOSIS — T7840XA Allergy, unspecified, initial encounter: Secondary | ICD-10-CM | POA: Insufficient documentation

## 2020-09-26 DIAGNOSIS — T7840XD Allergy, unspecified, subsequent encounter: Secondary | ICD-10-CM

## 2020-09-26 DIAGNOSIS — J309 Allergic rhinitis, unspecified: Secondary | ICD-10-CM | POA: Insufficient documentation

## 2020-09-26 DIAGNOSIS — E119 Type 2 diabetes mellitus without complications: Secondary | ICD-10-CM | POA: Diagnosis not present

## 2020-09-26 MED ORDER — AMOXICILLIN-POT CLAVULANATE 875-125 MG PO TABS: 1.0000 | ORAL_TABLET | Freq: Two times a day (BID) | ORAL | 0 refills | Status: DC

## 2020-09-26 MED ORDER — AMOXICILLIN-POT CLAVULANATE 875-125 MG PO TABS
1.0000 | ORAL_TABLET | Freq: Two times a day (BID) | ORAL | 0 refills | Status: DC
Start: 2020-09-26 — End: 2020-09-26

## 2020-09-26 MED ORDER — METHYLPREDNISOLONE ACETATE 80 MG/ML IJ SUSP
80.0000 mg | Freq: Once | INTRAMUSCULAR | Status: AC
Start: 2020-09-26 — End: 2020-09-26
  Administered 2020-09-26: 80 mg via INTRAMUSCULAR

## 2020-09-26 NOTE — Assessment & Plan Note (Signed)
Elevated likely reactive,  to f/u any worsening symptoms or concerns

## 2020-09-26 NOTE — Assessment & Plan Note (Addendum)
Mild to mod, for antibx course,  to f/u any worsening symptoms or concerns  I spent 31 minutes in preparing to see the patient by review of recent labs, imaging and procedures, obtaining and reviewing separately obtained history, communicating with the patient and family or caregiver, ordering medications, tests or procedures, and documenting clinical information in the EHR including the differential Dx, treatment, and any further evaluation and other management of uri allergies, dm, htn

## 2020-09-26 NOTE — Assessment & Plan Note (Signed)
/   to glucose tab, for depomedrol im 80,  to f/u any worsening symptoms or concerns

## 2020-09-26 NOTE — Progress Notes (Signed)
Subjective:    Patient ID: [name], male    DOB: September 15, 1956, 64 y.o.   MRN: [id number]  HPI  Here with co acute onset 2 days mild to mod st, feverish with swelling more to the right face and submandibular area with a knot there as well.  Did seem to start possibly after took a glucose tab x 2, the first with a tingling swelling under the tongue, then today reaction after a second pill?  Pt denies chest pain, increased sob or doe, wheezing, orthopnea, PND, increased LE swelling, palpitations, dizziness or syncope.  Pt denies new neurological symptoms such as new headache, or facial or extremity weakness or numbness   Pt denies polydipsia, polyuria,  Past Medical History:  Diagnosis Date  . ABNORMAL CHEST XRAY 2009  . Biceps tendon rupture   . DIABETES MELLITUS, TYPE II   . ERECTILE DYSFUNCTION, MILD   . Hyperlipidemia 07/14/2016   History, normal test results in 09/2018  . HYPERTENSION   . Hypogonadism male 03/08/2014  . Hypogonadism, male   . Overweight(278.02)   . PSEUDOFOLLICULITIS BARBAE   . PULMONARY NODULE, RIGHT UPPER LOBE 02/10/2009   MD just watching   Past Surgical History:  Procedure Laterality Date  . COLONOSCOPY  11/2008   Ardis Hughs poylps  . eye lid surgey Left   . NASAL POLYP SURGERY    . NASAL POLYP SURGERY  1980s  . WISDOM TOOTH EXTRACTION      reports that he quit smoking about 28 years ago. His smoking use included cigarettes. He has a 18.00 pack-year smoking history. He has never used smokeless tobacco. He reports that he does not drink alcohol and does not use drugs. family history includes Cancer in his brother; Diabetes in his mother; Hypertension in his mother; Kidney failure in his father. No Known Allergies Current Outpatient Medications on File Prior to Visit  Medication Sig Dispense Refill  . BD INSULIN SYRINGE U/F 31G X 5/16" 0.5 ML MISC     . carvedilol (COREG) 25 MG tablet     . chlorthalidone (HYGROTON) 25 MG tablet Take 25 mg by mouth daily.     . clindamycin (CLEOCIN T) 1 % external solution APPLY EXTERNALLY TO THE AFFECTED AREA TWICE DAILY 60 mL 5  . Continuous Blood Gluc Receiver (FREESTYLE LIBRE 14 DAY READER) DEVI 1 Device by Does not apply route daily. Use as directed once daily E11.9 1 Device 0  . Continuous Blood Gluc Sensor (FREESTYLE LIBRE 14 DAY SENSOR) MISC Apply 1 Device topically every 14 (fourteen) days. E11.9 6 each 3  . fluticasone (FLONASE) 50 MCG/ACT nasal spray Place into both nostrils.    Marland Kitchen glimepiride (AMARYL) 4 MG tablet Take 1 tablet (4 mg total) by mouth daily with breakfast. 90 tablet 3  . isosorbide mononitrate (IMDUR) 30 MG 24 hr tablet Take 15 mg by mouth daily.    Marland Kitchen losartan (COZAAR) 100 MG tablet Take 100 mg by mouth daily.    . metFORMIN (GLUCOPHAGE) 1000 MG tablet Take 1 tablet (1,000 mg total) by mouth 2 (two) times daily. 180 tablet 3  . rosuvastatin (CRESTOR) 40 MG tablet Take 40 mg by mouth daily.    . Semaglutide, 1 MG/DOSE, (OZEMPIC, 1 MG/DOSE,) 2 MG/1.5ML SOPN Inject 0.75 mLs (1 mg total) into the skin once a week. 1 pen 11  . testosterone (ANDROGEL) 50 MG/5GM (1%) GEL PLACE 5 GRAMS ONTO THE SKIN DAILY 450 g 1   No current facility-administered medications on file  prior to visit.   Review of Systems All otherwise neg per pt    Objective:   Physical Exam BP (!) 160/102 (BP Location: Left Arm, Patient Position: Sitting, Cuff Size: Large)   Pulse 82   Temp 99 F (37.2 C) (Oral)   Ht 6\' 2"  (1.88 m)   Wt 260 lb (117.9 kg)   SpO2 96%   BMI 33.38 kg/m  VS noted, mild ill Constitutional: Pt appears in NAD HENT: Head: NCAT.  Right Ear: External ear normal.  Left Ear: External ear normal.  Bilat tm's with mild erythema. Right  Max sinus areas mild tender.  Pharynx with mild erythema, no exudate, has vague nondiscrete swelling to right facies and submandibular with 1 cm sub mass tedner firm nonfluctuant right submandibular as well. Eyes: . Pupils are equal, round, and reactive to light.  Conjunctivae and EOM are normal Nose: without d/c or deformity Neck: Neck supple. Gross normal ROM Cardiovascular: Normal rate and regular rhythm.   Pulmonary/Chest: Effort normal and breath sounds without rales or wheezing.  Abd:  Soft, NT, ND, + BS, no organomegaly Neurological: Pt is alert. At baseline orientation, motor grossly intact Skin: Skin is warm. No rashes, other new lesions, no LE edema Psychiatric: Pt behavior is normal without agitation  All otherwise neg per pt Lab Results  Component Value Date   WBC 5.8 06/11/2020   HGB 12.0 (L) 06/11/2020   HCT 39.1 06/11/2020   PLT 158 06/11/2020   GLUCOSE 178 (H) 06/11/2020   CHOL 131 06/11/2020   TRIG 73 06/11/2020   HDL 41 06/11/2020   LDLCALC 75 06/11/2020   ALT 48 (H) 06/11/2020   AST 37 (H) 06/11/2020   NA 141 06/11/2020   K 4.7 06/11/2020   CL 105 06/11/2020   CREATININE 0.95 06/11/2020   BUN 12 06/11/2020   CO2 31 06/11/2020   TSH 1.70 06/11/2020   PSA 0.7 06/11/2020   HGBA1C 9.2 (H) 06/11/2020   MICROALBUR 3.3 06/11/2020      Assessment & Plan:

## 2020-09-26 NOTE — Patient Instructions (Addendum)
You had the steroid shot today  Please take all new medication as prescribed - the antibiotic  Please do not take that particular glucose tab in the future just in case it is involved  Please continue all other medications as before, and refills have been done if requested.  Please have the pharmacy call with any other refills you may need.  Please continue your efforts at being more active, low cholesterol diet, and weight control.  Please keep your appointments with your specialists as you may have planned

## 2020-09-26 NOTE — Assessment & Plan Note (Signed)
stable overall by history and exam, recent data reviewed with pt, and pt to continue medical treatment as before,  to f/u any worsening symptoms or concerns  

## 2020-10-06 DIAGNOSIS — Z7951 Long term (current) use of inhaled steroids: Secondary | ICD-10-CM | POA: Diagnosis not present

## 2020-10-06 DIAGNOSIS — Z9889 Other specified postprocedural states: Secondary | ICD-10-CM | POA: Diagnosis not present

## 2020-10-06 DIAGNOSIS — R42 Dizziness and giddiness: Secondary | ICD-10-CM | POA: Diagnosis not present

## 2020-10-14 ENCOUNTER — Encounter: Payer: Self-pay | Admitting: Orthopaedic Surgery

## 2020-10-14 ENCOUNTER — Ambulatory Visit: Payer: Federal, State, Local not specified - PPO | Admitting: Orthopaedic Surgery

## 2020-10-14 VITALS — BP 149/84 | HR 97 | Ht 74.0 in | Wt 260.0 lb

## 2020-10-14 DIAGNOSIS — M48061 Spinal stenosis, lumbar region without neurogenic claudication: Secondary | ICD-10-CM | POA: Diagnosis not present

## 2020-10-14 NOTE — Progress Notes (Signed)
Office Visit Note   Patient: [name]           Date of Birth: 02/16/1956           MRN: [id number] Visit Date: 10/14/2020              Requested by: Biagio Borg, MD [place],  [place] [id number] PCP: Biagio Borg, MD   Assessment & Plan: Visit Diagnoses:  1. Spinal stenosis of lumbar region without neurogenic claudication     Plan: Patient has severe spinal stenosis L4-5 with anterolisthesis.  Stenosis is multifactorial but currently he is gotten better and likely disc bulges decrease in size.  We discussed pathophysiology of the condition and again discussed symptoms.  If he develops progressive claudication he can return otherwise follow-up as needed.  Follow-Up Instructions: No follow-ups on file.   Orders:  No orders of the defined types were placed in this encounter.  No orders of the defined types were placed in this encounter.     Procedures: No procedures performed   Clinical Data: No additional findings.   Subjective: Chief Complaint  Patient presents with  . Lower Back - Follow-up    HPI 64 year old male returns with anterolisthesis severe central stenosis at L4-5.  He has been through therapy and states that therapy has helped significantly.  He states he is having minimal if any pain currently.  He previously had some prednisone.  He has short pedicles as well as multifactorial stenosis with hypertrophic facets and some disc bulge.  Review of Systems 14 point system update unchanged from 12/11/2019 office visit.   Objective: Vital Signs: BP (!) 149/84   Pulse 97   Ht 6\' 2"  (1.88 m)   Wt 260 lb (117.9 kg)   BMI 33.38 kg/m   Physical Exam Constitutional:      Appearance: He is well-developed.  HENT:     Head: Normocephalic and atraumatic.  Eyes:     Pupils: Pupils are equal, round, and reactive to light.  Neck:     Thyroid: No thyromegaly.     Trachea: No tracheal deviation.  Cardiovascular:     Rate and Rhythm:  Normal rate.  Pulmonary:     Effort: Pulmonary effort is normal.     Breath sounds: No wheezing.  Abdominal:     General: Bowel sounds are normal.     Palpations: Abdomen is soft.  Skin:    General: Skin is warm and dry.     Capillary Refill: Capillary refill takes less than 2 seconds.  Neurological:     Mental Status: He is alert and oriented to person, place, and time.  Psychiatric:        Behavior: Behavior normal.        Thought Content: Thought content normal.        Judgment: Judgment normal.     Ortho Exam Patient can toe walk heel walk.  Reflexes are intact negative logroll the hips. Specialty Comments:  No specialty comments available.  Imaging: No results found.   PMFS History: Patient Active Problem List   Diagnosis Date Noted  . URI (upper respiratory infection) 09/26/2020  . Allergic reaction 09/26/2020  . Spinal stenosis of lumbar region 04/15/2020  . Pain and swelling of right lower leg 10/30/2019  . Right lumbar radiculopathy 10/30/2019  . Hearing loss 12/21/2018  . Obstructive sleep apnea 12/18/2018  . History of colon polyps 08/24/2017  . Hyperlipidemia 07/14/2016  . Hematuria 09/06/2014  .  Hypogonadism male 03/08/2014  . Myalgia and myositis, unspecified 10/22/2012  . Preventative health care 10/22/2012  . PULMONARY NODULE, RIGHT UPPER LOBE 02/10/2009  . Obesity, Class I, BMI 30-34.9 09/26/2008  . Nonspecific (abnormal) findings on radiological and other examination of body structure 07/04/2008  . Diabetes ([place]) 08/29/2007  . Erectile dysfunction associated with type 2 diabetes mellitus ([place]) 08/29/2007  . Essential hypertension 08/29/2007  . PSEUDOFOLLICULITIS BARBAE 58/07/9832   Past Medical History:  Diagnosis Date  . ABNORMAL CHEST XRAY 2009  . Biceps tendon rupture   . DIABETES MELLITUS, TYPE II   . ERECTILE DYSFUNCTION, MILD   . Hyperlipidemia 07/14/2016   History, normal test results in 09/2018  . HYPERTENSION   . Hypogonadism male  03/08/2014  . Hypogonadism, male   . Overweight(278.02)   . PSEUDOFOLLICULITIS BARBAE   . PULMONARY NODULE, RIGHT UPPER LOBE 02/10/2009   MD just watching    Family History  Problem Relation Age of Onset  . Hypertension Mother   . Diabetes Mother   . Kidney failure Father   . Cancer Brother        Pancreatic  . Coronary artery disease Neg Hx   . Colon cancer Neg Hx   . Rectal cancer Neg Hx   . Stomach cancer Neg Hx     Past Surgical History:  Procedure Laterality Date  . COLONOSCOPY  11/2008   Ardis Hughs poylps  . eye lid surgey Left   . NASAL POLYP SURGERY    . NASAL POLYP SURGERY  1980s  . WISDOM TOOTH EXTRACTION     Social History   Occupational History  . Occupation: WORKS [employer]    Employer: [employer]  Tobacco Use  . Smoking status: Former Smoker    Packs/day: 1.00    Years: 18.00    Pack years: 18.00    Types: Cigarettes    Quit date: 10/20/1991    Years since quitting: 29.0  . Smokeless tobacco: Never Used  Vaping Use  . Vaping Use: Never used  Substance and Sexual Activity  . Alcohol use: No  . Drug use: No  . Sexual activity: Yes    Partners: Female

## 2020-10-21 DIAGNOSIS — T465X6A Underdosing of other antihypertensive drugs, initial encounter: Secondary | ICD-10-CM | POA: Diagnosis not present

## 2020-10-21 DIAGNOSIS — Z794 Long term (current) use of insulin: Secondary | ICD-10-CM | POA: Diagnosis not present

## 2020-10-21 DIAGNOSIS — I10 Essential (primary) hypertension: Secondary | ICD-10-CM | POA: Diagnosis not present

## 2020-10-21 DIAGNOSIS — E119 Type 2 diabetes mellitus without complications: Secondary | ICD-10-CM | POA: Diagnosis not present

## 2020-10-21 DIAGNOSIS — Z9114 Patient's other noncompliance with medication regimen: Secondary | ICD-10-CM | POA: Diagnosis not present

## 2020-10-21 DIAGNOSIS — Z91128 Patient's intentional underdosing of medication regimen for other reason: Secondary | ICD-10-CM | POA: Diagnosis not present

## 2020-10-21 DIAGNOSIS — T383X6A Underdosing of insulin and oral hypoglycemic [antidiabetic] drugs, initial encounter: Secondary | ICD-10-CM | POA: Diagnosis not present

## 2020-10-27 DIAGNOSIS — B351 Tinea unguium: Secondary | ICD-10-CM | POA: Diagnosis not present

## 2020-10-27 DIAGNOSIS — E1140 Type 2 diabetes mellitus with diabetic neuropathy, unspecified: Secondary | ICD-10-CM | POA: Diagnosis not present

## 2020-11-04 DIAGNOSIS — H04123 Dry eye syndrome of bilateral lacrimal glands: Secondary | ICD-10-CM | POA: Diagnosis not present

## 2020-11-04 DIAGNOSIS — E113293 Type 2 diabetes mellitus with mild nonproliferative diabetic retinopathy without macular edema, bilateral: Secondary | ICD-10-CM | POA: Diagnosis not present

## 2020-11-04 DIAGNOSIS — I10 Essential (primary) hypertension: Secondary | ICD-10-CM | POA: Diagnosis not present

## 2020-11-04 DIAGNOSIS — H35013 Changes in retinal vascular appearance, bilateral: Secondary | ICD-10-CM | POA: Diagnosis not present

## 2020-11-13 DIAGNOSIS — F329 Major depressive disorder, single episode, unspecified: Secondary | ICD-10-CM | POA: Diagnosis not present

## 2020-11-13 DIAGNOSIS — F411 Generalized anxiety disorder: Secondary | ICD-10-CM | POA: Diagnosis not present

## 2020-11-17 ENCOUNTER — Telehealth: Payer: Self-pay | Admitting: Internal Medicine

## 2020-11-17 ENCOUNTER — Other Ambulatory Visit: Payer: Self-pay

## 2020-11-17 MED ORDER — TESTOSTERONE 50 MG/5GM (1%) TD GEL: TRANSDERMAL | 1 refills | Status: DC

## 2020-11-17 NOTE — Telephone Encounter (Signed)
1.Medication Requested:testosterone (ANDROGEL) 50 MG/5GM (1%) GEL    2. Pharmacy (Name, Street, [address] [place], [place] AT [address]  3. On Med List:yes   4. Last Visit with PCP: 10.29.21  5. Next visit date with PCP: 1.18.22   Agent: Please be advised that RX refills may take up to 3 business days. We ask that you follow-up with your pharmacy.

## 2020-12-04 ENCOUNTER — Telehealth: Payer: Self-pay | Admitting: Internal Medicine

## 2020-12-04 NOTE — Telephone Encounter (Signed)
    Patient requesting refill for testosterone (ANDROGEL) 50 MG/5GM (1%) G[id number] - [place],  - [address] AT [address]

## 2020-12-05 ENCOUNTER — Other Ambulatory Visit: Payer: Self-pay

## 2020-12-05 MED ORDER — TESTOSTERONE 50 MG/5GM (1%) TD GEL: TRANSDERMAL | 1 refills | Status: DC

## 2020-12-08 NOTE — Telephone Encounter (Signed)
[place], [place] [address] Phone:  [phone]  Fax:  [phone]     Patient needs the [facility] sent to this [facility], not the one in [place]

## 2020-12-09 DIAGNOSIS — H524 Presbyopia: Secondary | ICD-10-CM | POA: Diagnosis not present

## 2020-12-09 MED ORDER — TESTOSTERONE 50 MG/5GM (1%) TD GEL: TRANSDERMAL | 1 refills | Status: DC

## 2020-12-12 ENCOUNTER — Telehealth: Payer: Self-pay | Admitting: Internal Medicine

## 2020-12-12 MED ORDER — TESTOSTERONE 50 MG/5GM (1%) TD GEL: TRANSDERMAL | 1 refills | Status: DC

## 2020-12-12 NOTE — Telephone Encounter (Signed)
Done erx 

## 2020-12-12 NOTE — Addendum Note (Signed)
Addended by: Biagio Borg on: 12/12/2020 05:08 PM   Modules accepted: Orders

## 2020-12-12 NOTE — Telephone Encounter (Signed)
[place], [place] [address] Phone:  [phone]  Fax:  [phone]     Medication was sent to the wrong place again. Patient is upset and wants to figure out what the problem is

## 2020-12-15 ENCOUNTER — Telehealth: Payer: Self-pay

## 2020-12-15 NOTE — Telephone Encounter (Signed)
Key: [id number]

## 2020-12-16 ENCOUNTER — Ambulatory Visit: Payer: Federal, State, Local not specified - PPO | Admitting: Internal Medicine

## 2020-12-18 ENCOUNTER — Telehealth: Payer: Self-pay

## 2020-12-18 NOTE — Telephone Encounter (Signed)
Patient was wondering if PA was approved for testosterone (ANDROGEL) 50 MG/5GM (1%) GEL. Please call the patient back at [phone].

## 2020-12-18 NOTE — Telephone Encounter (Signed)
Complete prior auth

## 2020-12-19 ENCOUNTER — Other Ambulatory Visit: Payer: Self-pay | Admitting: Internal Medicine

## 2020-12-19 MED ORDER — TESTOSTERONE 50 MG/5GM (1%) TD GEL: TRANSDERMAL | 1 refills | Status: AC

## 2020-12-23 NOTE — Telephone Encounter (Signed)
Patient states the pharmacy has not received anything  [place], [address] Phone:  [phone]  Fax:  [phone]

## 2020-12-24 ENCOUNTER — Telehealth: Payer: Self-pay

## 2020-12-24 NOTE — Telephone Encounter (Signed)
Notified the patient that Testerone medication was already sent the pharmacy however we are waiting on a PA to be completed.

## 2020-12-29 DIAGNOSIS — Z0279 Encounter for issue of other medical certificate: Secondary | ICD-10-CM | POA: Diagnosis not present

## 2020-12-29 DIAGNOSIS — R0602 Shortness of breath: Secondary | ICD-10-CM | POA: Diagnosis not present

## 2020-12-29 DIAGNOSIS — Z79899 Other long term (current) drug therapy: Secondary | ICD-10-CM | POA: Diagnosis not present

## 2020-12-29 DIAGNOSIS — E119 Type 2 diabetes mellitus without complications: Secondary | ICD-10-CM | POA: Diagnosis not present

## 2020-12-29 DIAGNOSIS — I10 Essential (primary) hypertension: Secondary | ICD-10-CM | POA: Diagnosis not present

## 2020-12-29 DIAGNOSIS — Z0189 Encounter for other specified special examinations: Secondary | ICD-10-CM | POA: Diagnosis not present

## 2020-12-29 DIAGNOSIS — E291 Testicular hypofunction: Secondary | ICD-10-CM | POA: Diagnosis not present

## 2021-01-09 DIAGNOSIS — E291 Testicular hypofunction: Secondary | ICD-10-CM | POA: Diagnosis not present

## 2021-01-09 DIAGNOSIS — F411 Generalized anxiety disorder: Secondary | ICD-10-CM | POA: Diagnosis not present

## 2021-01-09 DIAGNOSIS — F4320 Adjustment disorder, unspecified: Secondary | ICD-10-CM | POA: Diagnosis not present

## 2021-01-09 DIAGNOSIS — E119 Type 2 diabetes mellitus without complications: Secondary | ICD-10-CM | POA: Diagnosis not present

## 2021-02-12 ENCOUNTER — Other Ambulatory Visit: Payer: Self-pay | Admitting: Internal Medicine

## 2021-02-12 ENCOUNTER — Telehealth: Payer: Self-pay | Admitting: Internal Medicine

## 2021-02-12 DIAGNOSIS — E559 Vitamin D deficiency, unspecified: Secondary | ICD-10-CM

## 2021-02-12 DIAGNOSIS — Z0000 Encounter for general adult medical examination without abnormal findings: Secondary | ICD-10-CM

## 2021-02-12 DIAGNOSIS — E119 Type 2 diabetes mellitus without complications: Secondary | ICD-10-CM

## 2021-02-12 DIAGNOSIS — E538 Deficiency of other specified B group vitamins: Secondary | ICD-10-CM

## 2021-02-12 DIAGNOSIS — R7989 Other specified abnormal findings of blood chemistry: Secondary | ICD-10-CM

## 2021-02-12 NOTE — Telephone Encounter (Signed)
Patient called and is requesting labs before seeing PCP on 06/15/21. Please advise

## 2021-02-13 NOTE — Telephone Encounter (Signed)
Patient notified lab orders are in.

## 2021-02-13 NOTE — Telephone Encounter (Signed)
[facility] lab orders done

## 2021-02-27 DIAGNOSIS — E119 Type 2 diabetes mellitus without complications: Secondary | ICD-10-CM | POA: Diagnosis not present

## 2021-02-27 DIAGNOSIS — F4320 Adjustment disorder, unspecified: Secondary | ICD-10-CM | POA: Diagnosis not present

## 2021-02-27 DIAGNOSIS — F411 Generalized anxiety disorder: Secondary | ICD-10-CM | POA: Diagnosis not present

## 2021-02-27 DIAGNOSIS — E291 Testicular hypofunction: Secondary | ICD-10-CM | POA: Diagnosis not present

## 2021-03-05 DIAGNOSIS — R030 Elevated blood-pressure reading, without diagnosis of hypertension: Secondary | ICD-10-CM | POA: Diagnosis not present

## 2021-03-05 DIAGNOSIS — R519 Headache, unspecified: Secondary | ICD-10-CM | POA: Diagnosis not present

## 2021-03-30 DIAGNOSIS — R519 Headache, unspecified: Secondary | ICD-10-CM | POA: Diagnosis not present

## 2021-03-30 DIAGNOSIS — E119 Type 2 diabetes mellitus without complications: Secondary | ICD-10-CM | POA: Diagnosis not present

## 2021-03-30 DIAGNOSIS — E291 Testicular hypofunction: Secondary | ICD-10-CM | POA: Diagnosis not present

## 2021-04-28 DIAGNOSIS — Z794 Long term (current) use of insulin: Secondary | ICD-10-CM | POA: Diagnosis not present

## 2021-04-28 DIAGNOSIS — Z7984 Long term (current) use of oral hypoglycemic drugs: Secondary | ICD-10-CM | POA: Diagnosis not present

## 2021-04-28 DIAGNOSIS — E119 Type 2 diabetes mellitus without complications: Secondary | ICD-10-CM | POA: Diagnosis not present

## 2021-05-26 ENCOUNTER — Telehealth: Payer: Self-pay | Admitting: Internal Medicine

## 2021-05-26 NOTE — Telephone Encounter (Signed)
   Patient said that he received a bill from [facility] for service date 06-11-20 for Vitamin D. He said that he spoke with [facility] and they told him that Dr. Jenny Reichmann would have to change the code. Please advise

## 2021-06-03 NOTE — Telephone Encounter (Signed)
Sorry I dont understand the reqeust or what to do about this  All the tests I see that I ordered Mar 18 for future testing appear to have a resulting agency at [employer]

## 2021-06-08 ENCOUNTER — Other Ambulatory Visit (INDEPENDENT_AMBULATORY_CARE_PROVIDER_SITE_OTHER): Payer: Federal, State, Local not specified - PPO

## 2021-06-08 DIAGNOSIS — E559 Vitamin D deficiency, unspecified: Secondary | ICD-10-CM

## 2021-06-08 DIAGNOSIS — Z125 Encounter for screening for malignant neoplasm of prostate: Secondary | ICD-10-CM

## 2021-06-08 DIAGNOSIS — Z0000 Encounter for general adult medical examination without abnormal findings: Secondary | ICD-10-CM

## 2021-06-08 DIAGNOSIS — R7989 Other specified abnormal findings of blood chemistry: Secondary | ICD-10-CM

## 2021-06-08 DIAGNOSIS — E119 Type 2 diabetes mellitus without complications: Secondary | ICD-10-CM | POA: Diagnosis not present

## 2021-06-08 DIAGNOSIS — E538 Deficiency of other specified B group vitamins: Secondary | ICD-10-CM | POA: Diagnosis not present

## 2021-06-08 LAB — HEPATIC FUNCTION PANEL
ALT: 41 U/L (ref 0–53)
AST: 33 U/L (ref 0–37)
Albumin: 4.6 g/dL (ref 3.5–5.2)
Alkaline Phosphatase: 60 U/L (ref 39–117)
Bilirubin, Direct: 0.1 mg/dL (ref 0.0–0.3)
Total Bilirubin: 0.5 mg/dL (ref 0.2–1.2)
Total Protein: 7.8 g/dL (ref 6.0–8.3)

## 2021-06-08 LAB — BASIC METABOLIC PANEL
BUN: 15 mg/dL (ref 6–23)
CO2: 26 mEq/L (ref 19–32)
Calcium: 9.8 mg/dL (ref 8.4–10.5)
Chloride: 104 mEq/L (ref 96–112)
Creatinine, Ser: 0.95 mg/dL (ref 0.40–1.50)
GFR: 84.77 mL/min (ref >60.00)
Glucose, Bld: 167 mg/dL — ABNORMAL HIGH (ref 70–99)
Potassium: 4.5 mEq/L (ref 3.5–5.1)
Sodium: 138 mEq/L (ref 135–145)

## 2021-06-08 LAB — URINALYSIS, ROUTINE W REFLEX MICROSCOPIC
Bilirubin Urine: NEGATIVE
Hgb urine dipstick: NEGATIVE
Ketones, ur: NEGATIVE
Leukocytes,Ua: NEGATIVE
Nitrite: NEGATIVE
RBC / HPF: NONE SEEN (ref 0–?)
Specific Gravity, Urine: >=1.03 — AB (ref 1.000–1.030)
Total Protein, Urine: NEGATIVE
Urine Glucose: NEGATIVE
Urobilinogen, UA: 0.2 (ref 0.0–1.0)
WBC, UA: NONE SEEN (ref 0–?)
pH: 5.5 (ref 5.0–8.0)

## 2021-06-08 LAB — LIPID PANEL
Cholesterol: 160 mg/dL (ref 0–200)
HDL: 41.3 mg/dL (ref >39.00)
LDL Cholesterol: 105 mg/dL — ABNORMAL HIGH (ref 0–99)
NonHDL: 118.72
Total CHOL/HDL Ratio: 4
Triglycerides: 71 mg/dL (ref 0.0–149.0)
VLDL: 14.2 mg/dL (ref 0.0–40.0)

## 2021-06-08 LAB — TSH: TSH: 1.38 u[IU]/mL (ref 0.35–5.50)

## 2021-06-08 LAB — CBC WITH DIFFERENTIAL/PLATELET
Basophils Absolute: 0 10*3/uL (ref 0.0–0.1)
Basophils Relative: 0.2 % (ref 0.0–3.0)
Eosinophils Absolute: 0.2 10*3/uL (ref 0.0–0.7)
Eosinophils Relative: 2.2 % (ref 0.0–5.0)
HCT: 39.2 % (ref 39.0–52.0)
Hemoglobin: 12.5 g/dL — ABNORMAL LOW (ref 13.0–17.0)
Lymphocytes Relative: 38.9 % (ref 12.0–46.0)
Lymphs Abs: 2.6 10*3/uL (ref 0.7–4.0)
MCHC: 31.7 g/dL (ref 30.0–36.0)
MCV: 73.2 fl — ABNORMAL LOW (ref 78.0–100.0)
Monocytes Absolute: 0.4 10*3/uL (ref 0.1–1.0)
Monocytes Relative: 5.8 % (ref 3.0–12.0)
Neutro Abs: 3.6 10*3/uL (ref 1.4–7.7)
Neutrophils Relative %: 52.9 % (ref 43.0–77.0)
Platelets: 152 10*3/uL (ref 150.0–400.0)
RBC: 5.36 Mil/uL (ref 4.22–5.81)
RDW: 14.9 % (ref 11.5–15.5)
WBC: 6.8 10*3/uL (ref 4.0–10.5)

## 2021-06-08 LAB — MICROALBUMIN / CREATININE URINE RATIO
Creatinine,U: 223.3 mg/dL
Microalb Creat Ratio: 1.2 mg/g (ref 0.0–30.0)
Microalb, Ur: 2.6 mg/dL — ABNORMAL HIGH (ref 0.0–1.9)

## 2021-06-08 LAB — PSA: PSA: 1.2 ng/mL (ref 0.10–4.00)

## 2021-06-08 LAB — TESTOSTERONE: Testosterone: 134.67 ng/dL — ABNORMAL LOW (ref 300.00–890.00)

## 2021-06-08 LAB — VITAMIN D 25 HYDROXY (VIT D DEFICIENCY, FRACTURES): VITD: 38.89 ng/mL (ref 30.00–100.00)

## 2021-06-08 LAB — VITAMIN B12: Vitamin B-12: 224 pg/mL (ref 211–911)

## 2021-06-08 LAB — HEMOGLOBIN A1C: Hgb A1c MFr Bld: 9.6 % — ABNORMAL HIGH (ref 4.6–6.5)

## 2021-06-09 NOTE — Telephone Encounter (Signed)
[place] for code:  Vit d deficiency

## 2021-06-15 ENCOUNTER — Other Ambulatory Visit: Payer: Self-pay

## 2021-06-15 ENCOUNTER — Ambulatory Visit (INDEPENDENT_AMBULATORY_CARE_PROVIDER_SITE_OTHER): Payer: Federal, State, Local not specified - PPO | Admitting: Internal Medicine

## 2021-06-15 ENCOUNTER — Encounter: Payer: Self-pay | Admitting: Internal Medicine

## 2021-06-15 VITALS — BP 140/80 | HR 70 | Temp 99.2°F | Ht 74.0 in | Wt 253.6 lb

## 2021-06-15 DIAGNOSIS — E559 Vitamin D deficiency, unspecified: Secondary | ICD-10-CM | POA: Diagnosis not present

## 2021-06-15 DIAGNOSIS — E538 Deficiency of other specified B group vitamins: Secondary | ICD-10-CM | POA: Diagnosis not present

## 2021-06-15 DIAGNOSIS — Z0001 Encounter for general adult medical examination with abnormal findings: Secondary | ICD-10-CM | POA: Diagnosis not present

## 2021-06-15 DIAGNOSIS — E291 Testicular hypofunction: Secondary | ICD-10-CM

## 2021-06-15 DIAGNOSIS — E7800 Pure hypercholesterolemia, unspecified: Secondary | ICD-10-CM | POA: Diagnosis not present

## 2021-06-15 DIAGNOSIS — E1165 Type 2 diabetes mellitus with hyperglycemia: Secondary | ICD-10-CM | POA: Diagnosis not present

## 2021-06-15 DIAGNOSIS — I10 Essential (primary) hypertension: Secondary | ICD-10-CM

## 2021-06-15 MED ORDER — ATORVASTATIN CALCIUM 10 MG PO TABS: 10.0000 mg | ORAL_TABLET | Freq: Every day | ORAL | 3 refills | Status: DC

## 2021-06-15 MED ORDER — SEMAGLUTIDE (2 MG/DOSE) 8 MG/3ML ~~LOC~~ SOPN: 2.0000 mg | PEN_INJECTOR | SUBCUTANEOUS | 3 refills | Status: AC

## 2021-06-15 NOTE — Assessment & Plan Note (Signed)
Last vitamin D Lab Results  Component Value Date   VD25OH 38.89 06/08/2021   Stable, cont oral replacement

## 2021-06-15 NOTE — Patient Instructions (Addendum)
Ok to take the 2 mg ozempic, and the hardcopy script is given to you to take to the [place]  Please also take OTC B12 1000 mcg per day for 6 months  Please take all new medication as prescribed - the lipitor 10 mg per day for high choloesterol  Please continue all other medications as before  Please continue to take OTC Vitamin D3 at 2000 units per day, indefinitely   Please have the pharmacy call with any other refills you may need.  Please continue your efforts at being more active, low cholesterol diet, and weight control.  You are otherwise up to date with prevention measures today.  Please keep your appointments with your specialists as you may have planned  Please make an Appointment to return in 6 months, or sooner if needed, also with Lab Appointment for testing done 3-5 days before at the [facility] (so this is for TWO appointments - please see the scheduling desk as you leave)  Due to the ongoing Covid 19 pandemic, our lab now requires an appointment for any labs done at our office.  If you need labs done and do not have an appointment, please call our office ahead of time to schedule before presenting to the lab for your testing.

## 2021-06-15 NOTE — Assessment & Plan Note (Signed)
Age and sex appropriate education and counseling updated with regular exercise and diet Referrals for preventative services - none needed Immunizations addressed - declines covid booster, shigrix, tdap, Smoking counseling  - none needed Evidence for depression or other mood disorder - none significant Most recent labs reviewed. I have personally reviewed and have noted: 1) the patient's medical and social history 2) The patient's current medications and supplements 3) The patient's height, weight, and BMI have been recorded in the chart

## 2021-06-15 NOTE — Assessment & Plan Note (Addendum)
Lab Results  Component Value Date   LDLCALC 105 (H) 06/08/2021   Uncontrolled, goal ldl < 70, pt to continue current statin lipitor 10 , low chol diet and urged compliacne

## 2021-06-15 NOTE — Assessment & Plan Note (Signed)
BP Readings from Last 3 Encounters:  06/15/21 140/80  10/14/20 (!) 149/84  09/26/20 (!) 160/102   Stable, pt to continue medical treatment losartan

## 2021-06-15 NOTE — Assessment & Plan Note (Signed)
Lab Results  Component Value Date   VITAMINB12 224 06/08/2021   Low normal, to start otc oral replacement - b12 1000 mcg qd

## 2021-06-15 NOTE — Assessment & Plan Note (Signed)
Lab Results  Component Value Date   HGBA1C 9.6 (H) 06/08/2021   uncontrolled, pt to continue current medical treatment except for increased ozempic 2 mg weekly

## 2021-06-15 NOTE — Assessment & Plan Note (Signed)
Pt to continue f/u at [facility]

## 2021-06-15 NOTE — Progress Notes (Signed)
Patient ID: [name], male   DOB: [birth year], 65 y.o.   MRN: [id number]         Chief Complaint:: wellness exam and dm, low testosterone, low vit d, low b12, HLD       HPI:  [name] is a 65 y.o. male here for wellness exam; declines covid booster, shingrix, tdap, o/w up to date with preventive referrals and immunizations                        Also  Pt denies polydipsia, polyuria, or new focal neuro s/s. Also followed at the [facility] and plans to take rx there.  Tx for low testoerone there, Taking Vit D 2000 u qd, Not taking b12, admits to not always taking the statin.  Pt denies chest pain, increased sob or doe, wheezing, orthopnea, PND, increased LE swelling, palpitations, dizziness or syncope.    Pt denies fever, wt loss, night sweats, loss of appetite, or other constitutional symptoms    Wt Readings from Last 3 Encounters:  06/15/21 253 lb 9.6 oz (115 kg)  10/14/20 260 lb (117.9 kg)  09/26/20 260 lb (117.9 kg)   BP Readings from Last 3 Encounters:  06/15/21 140/80  10/14/20 (!) 149/84  09/26/20 (!) 160/102   Immunization History  Administered Date(s) Administered   PFIZER(Purple Top)SARS-COV-2 Vaccination 08/04/2020, 09/29/2020, 02/09/2021   Pneumococcal Conjugate-13 12/15/2015   Pneumococcal Polysaccharide-23 03/19/2015   Td 02/14/2009  There are no preventive care reminders to display for this patient.    Past Medical History:  Diagnosis Date   ABNORMAL CHEST XRAY 2009   Biceps tendon rupture    DIABETES MELLITUS, TYPE II    ERECTILE DYSFUNCTION, MILD    Hyperlipidemia 07/14/2016   History, normal test results in 09/2018   HYPERTENSION    Hypogonadism male 03/08/2014   Hypogonadism, male    [id number](810.17)    PSEUDOFOLLICULITIS BARBAE    PULMONARY NODULE, RIGHT UPPER LOBE 02/10/2009   MD just watching   Past Surgical History:  Procedure Laterality Date   COLONOSCOPY  11/2008   Ardis Hughs poylps   eye lid surgey Left    NASAL POLYP SURGERY     NASAL POLYP  SURGERY  1980s   WISDOM TOOTH EXTRACTION      reports that he quit smoking about 29 years ago. His smoking use included cigarettes. He has a 18.00 pack-year smoking history. He has never used smokeless tobacco. He reports that he does not drink alcohol and does not use drugs. family history includes Cancer in his brother; Diabetes in his mother; Hypertension in his mother; Kidney failure in his father. No Known Allergies Current Outpatient Medications on File Prior to Visit  Medication Sig Dispense Refill   BD INSULIN SYRINGE U/F 31G X 5/16" 0.5 ML MISC      clindamycin (CLEOCIN T) 1 % external solution APPLY EXTERNALLY TO THE AFFECTED AREA TWICE DAILY 60 mL 5   Continuous Blood Gluc Receiver (FREESTYLE LIBRE 14 DAY READER) DEVI 1 Device by Does not apply route daily. Use as directed once daily E11.9 1 Device 0   Continuous Blood Gluc Sensor (FREESTYLE LIBRE 14 DAY SENSOR) MISC Apply 1 Device topically every 14 (fourteen) days. E11.9 6 each 3   losartan (COZAAR) 100 MG tablet Take 100 mg by mouth daily.     metFORMIN (GLUCOPHAGE) 1000 MG tablet Take 1 tablet (1,000 mg total) by mouth 2 (two) times daily. 180 tablet 3  testosterone (ANDROGEL) 50 MG/5GM (1%) GEL PLACE 5 GRAMS ONTO THE SKIN DAILY 450 g 1   No current facility-administered medications on file prior to visit.        ROS:  All others reviewed and negative.  Objective        PE:  BP 140/80 (BP Location: Left Arm, Patient Position: Sitting, Cuff Size: Large)   Pulse 70   Temp 99.2 F (37.3 C) (Oral)   Ht 6\' 2"  (1.88 m)   Wt 253 lb 9.6 oz (115 kg)   SpO2 97%   BMI 32.56 kg/m                 Constitutional: Pt appears in NAD               HENT: Head: NCAT.                Right Ear: External ear normal.                 Left Ear: External ear normal.                Eyes: . Pupils are equal, round, and reactive to light. Conjunctivae and EOM are normal               Nose: without d/c or deformity               Neck: Neck  supple. Gross normal ROM               Cardiovascular: Normal rate and regular rhythm.                 Pulmonary/Chest: Effort normal and breath sounds without rales or wheezing.                Abd:  Soft, NT, ND, + BS, no organomegaly               Neurological: Pt is alert. At baseline orientation, motor grossly intact               Skin: Skin is warm. No rashes, no other new lesions, LE edema - none               Psychiatric: Pt behavior is normal without agitation   Micro: none  Cardiac tracings I have personally interpreted today:  none  Pertinent Radiological findings (summarize): none   Lab Results  Component Value Date   WBC 6.8 06/08/2021   HGB 12.5 (L) 06/08/2021   HCT 39.2 06/08/2021   PLT 152.0 06/08/2021   GLUCOSE 167 (H) 06/08/2021   CHOL 160 06/08/2021   TRIG 71.0 06/08/2021   HDL 41.30 06/08/2021   LDLCALC 105 (H) 06/08/2021   ALT 41 06/08/2021   AST 33 06/08/2021   NA 138 06/08/2021   K 4.5 06/08/2021   CL 104 06/08/2021   CREATININE 0.95 06/08/2021   BUN 15 06/08/2021   CO2 26 06/08/2021   TSH 1.38 06/08/2021   PSA 1.20 06/08/2021   HGBA1C 9.6 (H) 06/08/2021   MICROALBUR 2.6 (H) 06/08/2021   Assessment/Plan:  [name] is a 65 y.o. Black or African American [2] male with  has a past medical history of ABNORMAL CHEST XRAY (2009), Biceps tendon rupture, DIABETES MELLITUS, TYPE II, ERECTILE DYSFUNCTION, MILD, Hyperlipidemia (07/14/2016), HYPERTENSION, Hypogonadism male (03/08/2014), Hypogonadism, male, OVFIEPPIRJ(188.41), PSEUDOFOLLICULITIS BARBAE, and PULMONARY NODULE, RIGHT UPPER LOBE (02/10/2009).  Encounter for well adult exam with abnormal findings Age and sex  appropriate education and counseling updated with regular exercise and diet Referrals for preventative services - none needed Immunizations addressed - declines covid booster, shigrix, tdap, Smoking counseling  - none needed Evidence for depression or other mood disorder - none  significant Most recent labs reviewed. I have personally reviewed and have noted: 1) the patient's medical and social history 2) The patient's current medications and supplements 3) The patient's height, weight, and BMI have been recorded in the chart   Hypogonadism male Pt to continue f/u at [facility]  Hyperlipidemia Lab Results  Component Value Date   [place] 105 (H) 06/08/2021   Uncontrolled, goal ldl < 70, pt to continue current statin lipitor 10 , low chol diet and urged compliacne   Essential hypertension BP Readings from Last 3 Encounters:  06/15/21 140/80  10/14/20 (!) 149/84  09/26/20 (!) 160/102   Stable, pt to continue medical treatment losartan   Diabetes ([place]) Lab Results  Component Value Date   HGBA1C 9.6 (H) 06/08/2021   uncontrolled, pt to continue current medical treatment except for increased ozempic 2 mg weekly   Vitamin D deficiency Last vitamin D Lab Results  Component Value Date   VD25OH 38.89 06/08/2021   Stable, cont oral replacement   Vitamin B12 deficiency Lab Results  Component Value Date   VITAMINB12 224 06/08/2021   Low normal, to start otc oral replacement - b12 1000 mcg qd  Followup: Return in about 6 months (around 12/16/2021).  Cathlean Cower, MD 06/15/2021 10:31 PM [facility]

## 2021-07-03 DIAGNOSIS — R519 Headache, unspecified: Secondary | ICD-10-CM | POA: Diagnosis not present

## 2021-07-03 DIAGNOSIS — Z23 Encounter for immunization: Secondary | ICD-10-CM | POA: Diagnosis not present

## 2021-07-03 DIAGNOSIS — N529 Male erectile dysfunction, unspecified: Secondary | ICD-10-CM | POA: Diagnosis not present

## 2021-07-03 DIAGNOSIS — E119 Type 2 diabetes mellitus without complications: Secondary | ICD-10-CM | POA: Diagnosis not present

## 2021-07-03 DIAGNOSIS — I10 Essential (primary) hypertension: Secondary | ICD-10-CM | POA: Diagnosis not present

## 2021-08-07 ENCOUNTER — Telehealth: Payer: Self-pay

## 2021-08-07 NOTE — Telephone Encounter (Signed)
Please advise as the pt has stated he was billed a $30 fee for his 06/15/2021 visit which he was here for his physical and thought him discussing his health issues and concerns would be apart of this visit, in which it was not because he was charged $30 for wanting to discuss labs and stuff.  **Pt has contacted billing and they advised him to contact the office as the reason for the $30 was because things were discussed other then his physical per the coding reason for the $30 charge.  Pt telephone number to be contacted at is [phone].

## 2021-08-26 NOTE — Telephone Encounter (Signed)
   Patient is calling to speak with  Amy Y. Patient states there was nothing discussed during 06/15/21 visit that should have  added an office fee. Patient has spoken to billing. Patient was explained the differences that can occur with preventive visits.   Patient still requesting call from Amy Y.

## 2021-09-04 ENCOUNTER — Other Ambulatory Visit: Payer: Self-pay | Admitting: Internal Medicine

## 2021-09-04 MED ORDER — FREESTYLE LIBRE 14 DAY SENSOR MISC: 1.0000 | 3 refills | Status: DC

## 2021-09-08 ENCOUNTER — Other Ambulatory Visit: Payer: Self-pay | Admitting: Internal Medicine

## 2021-09-08 MED ORDER — FREESTYLE LIBRE 14 DAY SENSOR MISC: 1.0000 | 3 refills | Status: DC

## 2021-09-09 NOTE — Telephone Encounter (Signed)
Patient says he has called several times requesting to discuss this billing issue & no one has called him back  Patient says nothing outside of the physical was discussed so he does not understand why he was billed for an [place] [phone]

## 2021-09-16 DIAGNOSIS — N529 Male erectile dysfunction, unspecified: Secondary | ICD-10-CM | POA: Diagnosis not present

## 2021-09-28 ENCOUNTER — Encounter: Payer: Self-pay | Admitting: Neurology

## 2021-09-28 DIAGNOSIS — N529 Male erectile dysfunction, unspecified: Secondary | ICD-10-CM | POA: Diagnosis not present

## 2021-09-28 DIAGNOSIS — Z7189 Other specified counseling: Secondary | ICD-10-CM | POA: Diagnosis not present

## 2021-09-29 DIAGNOSIS — N529 Male erectile dysfunction, unspecified: Secondary | ICD-10-CM | POA: Diagnosis not present

## 2021-09-29 DIAGNOSIS — Z79899 Other long term (current) drug therapy: Secondary | ICD-10-CM | POA: Diagnosis not present

## 2021-09-29 DIAGNOSIS — Z794 Long term (current) use of insulin: Secondary | ICD-10-CM | POA: Diagnosis not present

## 2021-09-29 DIAGNOSIS — E11649 Type 2 diabetes mellitus with hypoglycemia without coma: Secondary | ICD-10-CM | POA: Diagnosis not present

## 2021-09-29 DIAGNOSIS — Z7984 Long term (current) use of oral hypoglycemic drugs: Secondary | ICD-10-CM | POA: Diagnosis not present

## 2021-10-14 DIAGNOSIS — N529 Male erectile dysfunction, unspecified: Secondary | ICD-10-CM | POA: Diagnosis not present

## 2021-10-21 DIAGNOSIS — Z794 Long term (current) use of insulin: Secondary | ICD-10-CM | POA: Diagnosis not present

## 2021-10-21 DIAGNOSIS — M47897 Other spondylosis, lumbosacral region: Secondary | ICD-10-CM | POA: Diagnosis not present

## 2021-10-21 DIAGNOSIS — M47895 Other spondylosis, thoracolumbar region: Secondary | ICD-10-CM | POA: Diagnosis not present

## 2021-10-21 DIAGNOSIS — Z23 Encounter for immunization: Secondary | ICD-10-CM | POA: Diagnosis not present

## 2021-10-21 DIAGNOSIS — I10 Essential (primary) hypertension: Secondary | ICD-10-CM | POA: Diagnosis not present

## 2021-10-21 DIAGNOSIS — E119 Type 2 diabetes mellitus without complications: Secondary | ICD-10-CM | POA: Diagnosis not present

## 2021-10-21 DIAGNOSIS — M47893 Other spondylosis, cervicothoracic region: Secondary | ICD-10-CM | POA: Diagnosis not present

## 2021-11-01 NOTE — Progress Notes (Signed)
NEUROLOGY CONSULTATION NOTE  [name] MRN: [id number] DOB: Dec 13, 1955  Referring provider: Kristopher Oppenheim, PA-C Primary care provider: Cathlean Cower, MD  Reason for consult:  headaches  Assessment/Plan:   Migraine without aura, without status migrainosus, not intractable  MRI of brain with and without contrast Migraine prevention:  start topiramate 25mg  at bedtime for one week, then increase to 50mg  at bedtime Migraine rescue:  Stop sumatriptan.  Nurtec. Limit use of pain relievers to no more than 2 days out of week to prevent risk of rebound or medication-overuse headache. Keep headache diary Follow up 7-8 months.    Subjective:  [name] is a 65 year old male with DM II and HTN who presents for headaches.  History supplemented by referring provider's note.  Onset:  headaches really started around early 2021.   Location:  across forehead Quality:  pressure, rarely throbbing Intensity:  5-6/10.   Aura:  absent Prodrome:  absent Associated symptoms:  Nausea, photophobia, blurred vision.  He denies associated vomiting, phonophobia, unilateral numbness or weakness. Duration:  1 to 2 hours Frequency:  2-3 times a week Frequency of abortive medication: 3 days a week Triggers:  none Relieving factors:  rest Activity:  sometimes cannot function  Headaches were evaluated with CT head on 03/05/2021 which showed a partially empty sella.  He reports that he has an upcoming eye exam.    Current NSAIDS/analgesics:  Tylenol, Excedrin, BC, Goody Current triptans:  sumatriptan 100mg  Current ergotamine:  none Current anti-emetic:  none Current muscle relaxants:  none Current Antihypertensive medications:  losartan Current Antidepressant medications:  none Current Anticonvulsant medications:  none Current anti-CGRP:  none Current Vitamins/Herbal/Supplements:  none Current Antihistamines/Decongestants:  cetirizine Other therapy:  none   Past NSAIDS/analgesics:   ibuprofen Past abortive triptans:  none Past abortive ergotamine:  none Past muscle relaxants:  none Past anti-emetic:  none Past antihypertensive medications:  carvidelol, isosorbide Past antidepressant medications:  sertraline 200mg  daily, trazodone 50mg  QHS (insomnia) Past anticonvulsant medications:  none Past anti-CGRP:  none Past vitamins/Herbal/Supplements:  none Past antihistamines/decongestants:  none Other past therapies:  none  Caffeine:  No coffee, soda, tea Diet:  24-36 oz water daily.  Does not skip meals Exercise:  not routine.  A little bit of walking Depression:  no; Anxiety:  sometimes Other pain:  no Sleep hygiene:  varies Family history of headache:  No.  No known family history of aneurysms.      PAST MEDICAL HISTORY: Past Medical History:  Diagnosis Date   ABNORMAL CHEST XRAY 2009   Biceps tendon rupture    DIABETES MELLITUS, TYPE II    ERECTILE DYSFUNCTION, MILD    Hyperlipidemia 07/14/2016   History, normal test results in 09/2018   HYPERTENSION    Hypogonadism male 03/08/2014   Hypogonadism, male    [id number](563.87)    PSEUDOFOLLICULITIS BARBAE    PULMONARY NODULE, RIGHT UPPER LOBE 02/10/2009   MD just watching    PAST SURGICAL HISTORY: Past Surgical History:  Procedure Laterality Date   COLONOSCOPY  11/2008   Ardis Hughs poylps   eye lid surgey Left    NASAL POLYP SURGERY     NASAL POLYP SURGERY  1980s   WISDOM TOOTH EXTRACTION      MEDICATIONS: Current Outpatient Medications on File Prior to Visit  Medication Sig Dispense Refill   atorvastatin (LIPITOR) 10 MG tablet Take 1 tablet (10 mg total) by mouth daily. 90 tablet 3   BD INSULIN SYRINGE U/F 31G X 5/16" 0.5  ML MISC      clindamycin (CLEOCIN T) 1 % external solution APPLY EXTERNALLY TO THE AFFECTED AREA TWICE DAILY 60 mL 5   Continuous Blood Gluc Receiver (FREESTYLE LIBRE 14 DAY READER) DEVI 1 Device by Does not apply route daily. Use as directed once daily E11.9 1 Device 0    Continuous Blood Gluc Sensor (FREESTYLE LIBRE 14 DAY SENSOR) MISC Apply 1 Device topically every 14 (fourteen) days. E11.9 6 each 3   losartan (COZAAR) 100 MG tablet Take 100 mg by mouth daily.     metFORMIN (GLUCOPHAGE) 1000 MG tablet Take 1 tablet (1,000 mg total) by mouth 2 (two) times daily. 180 tablet 3   Semaglutide, 2 MG/DOSE, 8 MG/3ML SOPN Inject 2 mg as directed once a week. 9 mL 3   testosterone (ANDROGEL) 50 MG/5GM (1%) GEL PLACE 5 GRAMS ONTO THE SKIN DAILY 450 g 1   No current facility-administered medications on file prior to visit.    ALLERGIES: No Known Allergies  FAMILY HISTORY: Family History  Problem Relation Age of Onset   Hypertension Mother    Diabetes Mother    Kidney failure Father    Cancer Brother        Pancreatic   Coronary artery disease Neg Hx    Colon cancer Neg Hx    Rectal cancer Neg Hx    Stomach cancer Neg Hx     Objective:  Blood pressure (!) 146/80, pulse (!) 102, resp. rate 18, height 6\' 2"  (1.88 m), weight 254 lb (115.2 kg), SpO2 98 %. General: No acute distress.  Patient appears well-groomed.   Head:  Normocephalic/atraumatic Eyes:  fundi examined but not visualized Neck: supple, no paraspinal tenderness, full range of motion Back: No paraspinal tenderness Heart: regular rate and rhythm Lungs: Clear to auscultation bilaterally. Vascular: No carotid bruits. Neurological Exam: Mental status: alert and oriented to person, place, and time, recent and remote memory intact, fund of knowledge intact, attention and concentration intact, speech fluent and not dysarthric, language intact. Cranial nerves: CN I: not tested CN II: pupils equal, round and reactive to light, visual fields intact CN III, IV, VI:  full range of motion, no nystagmus, no ptosis CN V: facial sensation intact. CN VII: upper and lower face symmetric CN VIII: hearing intact CN IX, X: gag intact, uvula midline CN XI: sternocleidomastoid and trapezius muscles intact CN  XII: tongue midline Bulk & Tone: normal, no fasciculations. Motor:  muscle strength 5/5 throughout Sensation:  Pinprick, temperature and vibratory sensation intact. Deep Tendon Reflexes:  2+ throughout,  toes downgoing.   Finger to nose testing:  Without dysmetria.   Heel to shin:  Without dysmetria.   Gait:  Normal station and stride.  Romberg negative.    Thank you for allowing me to take part in the care of this patient.  Metta Clines, DO  CC:  Kristopher Oppenheim, PA-C  Cathlean Cower, MD

## 2021-11-02 ENCOUNTER — Ambulatory Visit (INDEPENDENT_AMBULATORY_CARE_PROVIDER_SITE_OTHER): Payer: No Typology Code available for payment source | Admitting: Neurology

## 2021-11-02 ENCOUNTER — Encounter: Payer: Self-pay | Admitting: Neurology

## 2021-11-02 ENCOUNTER — Other Ambulatory Visit: Payer: Self-pay

## 2021-11-02 VITALS — BP 146/80 | HR 102 | Resp 18 | Ht 74.0 in | Wt 254.0 lb

## 2021-11-02 DIAGNOSIS — G35 Multiple sclerosis: Secondary | ICD-10-CM | POA: Diagnosis not present

## 2021-11-02 DIAGNOSIS — R519 Headache, unspecified: Secondary | ICD-10-CM | POA: Diagnosis not present

## 2021-11-02 DIAGNOSIS — G43009 Migraine without aura, not intractable, without status migrainosus: Secondary | ICD-10-CM | POA: Diagnosis not present

## 2021-11-02 MED ORDER — TOPIRAMATE 50 MG PO TABS: ORAL_TABLET | ORAL | 0 refills | Status: DC

## 2021-11-02 NOTE — Progress Notes (Signed)
Medication Samples have been provided to the patient.  Drug name: Nurtec       Strength: 75mg         Qty: 2  LOT: [id number]  Exp.Date: 03/2024  Dosing instructions: as  needed  The patient has been instructed regarding the correct time, dose, and frequency of taking this medication, including desired effects and most common side effects.   Venetia Night 2:31 PM 11/02/2021

## 2021-11-02 NOTE — Patient Instructions (Signed)
  MRI of brain with and without contrast Start topiramate 50mg  tablet - take 1/2 tablet at bedtime for one week, then increase to 1 tablet at bedtime.  Contact us in 8 weeks with update and we can increase dose if needed. Take Nurtec at earliest onset of headache.  Maximum 1 tablet in 24 hours.  If effective, contact me for a prescription.  Stop sumatriptan Limit use of pain relievers to no more than 2 days out of the week.  These medications include acetaminophen, NSAIDs (ibuprofen/Advil/Motrin, naproxen/Aleve, triptans (Imitrex/sumatriptan), Excedrin, and narcotics.  This will help reduce risk of rebound headaches. Be aware of common food triggers:  - Caffeine:  coffee, black tea, cola, Mt. Dew  - Chocolate  - Dairy:  aged cheeses (brie, blue, cheddar, gouda, [place], provolone, [place], Swiss, etc), chocolate milk, buttermilk, sour cream, limit eggs and yogurt  - Nuts, peanut butter  - Alcohol  - Cereals/grains:  FRESH breads (fresh bagels, sourdough, doughnuts), yeast productions  - Processed/canned/aged/cured meats (pre-packaged deli meats, hotdogs)  - MSG/glutamate:  soy sauce, flavor enhancer, pickled/preserved/marinated foods  - Sweeteners:  aspartame (Equal, Nutrasweet).  Sugar and Splenda are okay  - Vegetables:  legumes (lima beans, lentils, snow peas, fava beans, pinto peans, peas, garbanzo beans), sauerkraut, onions, olives, pickles  - Fruit:  avocados, bananas, citrus fruit (orange, lemon, grapefruit), mango  - Other:  Frozen meals, macaroni and cheese Routine exercise Stay adequately hydrated (aim for 64 oz water daily) Keep headache diary Maintain proper stress management Maintain proper sleep hygiene Do not skip meals Consider supplements:  magnesium citrate 400mg  daily, riboflavin 400mg  daily, coenzyme Q10 100mg  three times daily.

## 2021-11-03 ENCOUNTER — Other Ambulatory Visit: Payer: Self-pay

## 2021-11-11 DIAGNOSIS — E119 Type 2 diabetes mellitus without complications: Secondary | ICD-10-CM | POA: Diagnosis not present

## 2021-11-12 DIAGNOSIS — E113212 Type 2 diabetes mellitus with mild nonproliferative diabetic retinopathy with macular edema, left eye: Secondary | ICD-10-CM | POA: Diagnosis not present

## 2021-11-12 DIAGNOSIS — E113291 Type 2 diabetes mellitus with mild nonproliferative diabetic retinopathy without macular edema, right eye: Secondary | ICD-10-CM | POA: Diagnosis not present

## 2021-11-12 DIAGNOSIS — H35013 Changes in retinal vascular appearance, bilateral: Secondary | ICD-10-CM | POA: Diagnosis not present

## 2021-11-12 DIAGNOSIS — H04123 Dry eye syndrome of bilateral lacrimal glands: Secondary | ICD-10-CM | POA: Diagnosis not present

## 2021-12-02 DIAGNOSIS — H5200 Hypermetropia, unspecified eye: Secondary | ICD-10-CM | POA: Diagnosis not present

## 2021-12-02 DIAGNOSIS — H52209 Unspecified astigmatism, unspecified eye: Secondary | ICD-10-CM | POA: Diagnosis not present

## 2021-12-02 DIAGNOSIS — H2513 Age-related nuclear cataract, bilateral: Secondary | ICD-10-CM | POA: Diagnosis not present

## 2021-12-02 DIAGNOSIS — H524 Presbyopia: Secondary | ICD-10-CM | POA: Diagnosis not present

## 2021-12-03 ENCOUNTER — Other Ambulatory Visit: Payer: No Typology Code available for payment source

## 2021-12-11 ENCOUNTER — Telehealth: Payer: Self-pay | Admitting: Neurology

## 2021-12-11 MED ORDER — TOPIRAMATE 50 MG PO TABS: ORAL_TABLET | ORAL | 5 refills | Status: DC

## 2021-12-11 NOTE — Telephone Encounter (Signed)
[name] needs a refill on his topiramate 50mg . [facility] to [facility] an [facility] to [facility] since [facility]

## 2021-12-11 NOTE — Telephone Encounter (Signed)
Refill sent.

## 2021-12-16 ENCOUNTER — Ambulatory Visit: Payer: Federal, State, Local not specified - PPO | Admitting: Internal Medicine

## 2022-01-01 DIAGNOSIS — Z794 Long term (current) use of insulin: Secondary | ICD-10-CM | POA: Diagnosis not present

## 2022-01-01 DIAGNOSIS — E119 Type 2 diabetes mellitus without complications: Secondary | ICD-10-CM | POA: Diagnosis not present

## 2022-01-21 DIAGNOSIS — I10 Essential (primary) hypertension: Secondary | ICD-10-CM | POA: Diagnosis not present

## 2022-01-21 DIAGNOSIS — E11649 Type 2 diabetes mellitus with hypoglycemia without coma: Secondary | ICD-10-CM | POA: Diagnosis not present

## 2022-01-21 DIAGNOSIS — I429 Cardiomyopathy, unspecified: Secondary | ICD-10-CM | POA: Diagnosis not present

## 2022-01-21 DIAGNOSIS — Z794 Long term (current) use of insulin: Secondary | ICD-10-CM | POA: Diagnosis not present

## 2022-01-22 ENCOUNTER — Telehealth: Payer: Self-pay | Admitting: Internal Medicine

## 2022-01-22 DIAGNOSIS — Z0000 Encounter for general adult medical examination without abnormal findings: Secondary | ICD-10-CM

## 2022-01-22 DIAGNOSIS — E1165 Type 2 diabetes mellitus with hyperglycemia: Secondary | ICD-10-CM

## 2022-01-22 DIAGNOSIS — E559 Vitamin D deficiency, unspecified: Secondary | ICD-10-CM

## 2022-01-22 DIAGNOSIS — E538 Deficiency of other specified B group vitamins: Secondary | ICD-10-CM

## 2022-01-22 NOTE — Telephone Encounter (Signed)
Ok labs ordered 

## 2022-01-22 NOTE — Telephone Encounter (Signed)
Pt requesting an order for labs prior to his follow-up appt on 02-11-2022  Encouraged pt to schedule a CPE, pt declined

## 2022-01-26 NOTE — Telephone Encounter (Signed)
Called patient to inform him that his lab orders was entered.

## 2022-01-28 DIAGNOSIS — N529 Male erectile dysfunction, unspecified: Secondary | ICD-10-CM | POA: Diagnosis not present

## 2022-02-05 ENCOUNTER — Other Ambulatory Visit (INDEPENDENT_AMBULATORY_CARE_PROVIDER_SITE_OTHER): Payer: No Typology Code available for payment source

## 2022-02-05 DIAGNOSIS — E1165 Type 2 diabetes mellitus with hyperglycemia: Secondary | ICD-10-CM | POA: Diagnosis not present

## 2022-02-05 DIAGNOSIS — E559 Vitamin D deficiency, unspecified: Secondary | ICD-10-CM

## 2022-02-05 DIAGNOSIS — Z0000 Encounter for general adult medical examination without abnormal findings: Secondary | ICD-10-CM

## 2022-02-05 DIAGNOSIS — E538 Deficiency of other specified B group vitamins: Secondary | ICD-10-CM | POA: Diagnosis not present

## 2022-02-05 LAB — URINALYSIS, ROUTINE W REFLEX MICROSCOPIC
Bilirubin Urine: NEGATIVE
Hgb urine dipstick: NEGATIVE
Ketones, ur: NEGATIVE
Leukocytes,Ua: NEGATIVE
Nitrite: NEGATIVE
Specific Gravity, Urine: >=1.03 — AB (ref 1.000–1.030)
Total Protein, Urine: NEGATIVE
Urine Glucose: 100 — AB
Urobilinogen, UA: 1 (ref 0.0–1.0)
pH: 5.5 (ref 5.0–8.0)

## 2022-02-05 LAB — BASIC METABOLIC PANEL
BUN: 11 mg/dL (ref 6–23)
CO2: 31 mEq/L (ref 19–32)
Calcium: 9.3 mg/dL (ref 8.4–10.5)
Chloride: 104 mEq/L (ref 96–112)
Creatinine, Ser: 0.93 mg/dL (ref 0.40–1.50)
GFR: 86.56 mL/min (ref >60.00)
Glucose, Bld: 156 mg/dL — ABNORMAL HIGH (ref 70–99)
Potassium: 4.4 mEq/L (ref 3.5–5.1)
Sodium: 141 mEq/L (ref 135–145)

## 2022-02-05 LAB — HEPATIC FUNCTION PANEL
ALT: 32 U/L (ref 0–53)
AST: 26 U/L (ref 0–37)
Albumin: 4.3 g/dL (ref 3.5–5.2)
Alkaline Phosphatase: 52 U/L (ref 39–117)
Bilirubin, Direct: 0.1 mg/dL (ref 0.0–0.3)
Total Bilirubin: 0.5 mg/dL (ref 0.2–1.2)
Total Protein: 7.2 g/dL (ref 6.0–8.3)

## 2022-02-05 LAB — CBC WITH DIFFERENTIAL/PLATELET
Basophils Absolute: 0 10*3/uL (ref 0.0–0.1)
Basophils Relative: 0.4 % (ref 0.0–3.0)
Eosinophils Absolute: 0.2 10*3/uL (ref 0.0–0.7)
Eosinophils Relative: 3.7 % (ref 0.0–5.0)
HCT: 40.7 % (ref 39.0–52.0)
Hemoglobin: 12.8 g/dL — ABNORMAL LOW (ref 13.0–17.0)
Lymphocytes Relative: 42.4 % (ref 12.0–46.0)
Lymphs Abs: 2.6 10*3/uL (ref 0.7–4.0)
MCHC: 31.5 g/dL (ref 30.0–36.0)
MCV: 74.2 fl — ABNORMAL LOW (ref 78.0–100.0)
Monocytes Absolute: 0.5 10*3/uL (ref 0.1–1.0)
Monocytes Relative: 8.3 % (ref 3.0–12.0)
Neutro Abs: 2.8 10*3/uL (ref 1.4–7.7)
Neutrophils Relative %: 45.2 % (ref 43.0–77.0)
Platelets: 138 10*3/uL — ABNORMAL LOW (ref 150.0–400.0)
RBC: 5.49 Mil/uL (ref 4.22–5.81)
RDW: 15.3 % (ref 11.5–15.5)
WBC: 6.2 10*3/uL (ref 4.0–10.5)

## 2022-02-05 LAB — MICROALBUMIN / CREATININE URINE RATIO
Creatinine,U: 255.5 mg/dL
Microalb Creat Ratio: 1.4 mg/g (ref 0.0–30.0)
Microalb, Ur: 3.5 mg/dL — ABNORMAL HIGH (ref 0.0–1.9)

## 2022-02-05 LAB — LIPID PANEL
Cholesterol: 165 mg/dL (ref 0–200)
HDL: 43.6 mg/dL (ref >39.00)
LDL Cholesterol: 106 mg/dL — ABNORMAL HIGH (ref 0–99)
NonHDL: 121.84
Total CHOL/HDL Ratio: 4
Triglycerides: 80 mg/dL (ref 0.0–149.0)
VLDL: 16 mg/dL (ref 0.0–40.0)

## 2022-02-05 LAB — VITAMIN D 25 HYDROXY (VIT D DEFICIENCY, FRACTURES): VITD: 62 ng/mL (ref 30.00–100.00)

## 2022-02-05 LAB — HEMOGLOBIN A1C: Hgb A1c MFr Bld: 7.9 % — ABNORMAL HIGH (ref 4.6–6.5)

## 2022-02-05 LAB — PSA: PSA: 0.96 ng/mL (ref 0.10–4.00)

## 2022-02-05 LAB — VITAMIN B12: Vitamin B-12: 1414 pg/mL — ABNORMAL HIGH (ref 211–911)

## 2022-02-05 LAB — TSH: TSH: 2.78 u[IU]/mL (ref 0.35–5.50)

## 2022-02-11 ENCOUNTER — Ambulatory Visit (INDEPENDENT_AMBULATORY_CARE_PROVIDER_SITE_OTHER): Payer: No Typology Code available for payment source | Admitting: Internal Medicine

## 2022-02-11 ENCOUNTER — Encounter: Payer: Self-pay | Admitting: Internal Medicine

## 2022-02-11 ENCOUNTER — Other Ambulatory Visit: Payer: Self-pay

## 2022-02-11 VITALS — BP 140/82 | HR 77 | Temp 99.3°F | Ht 74.0 in | Wt 256.0 lb

## 2022-02-11 DIAGNOSIS — E7800 Pure hypercholesterolemia, unspecified: Secondary | ICD-10-CM

## 2022-02-11 DIAGNOSIS — E1165 Type 2 diabetes mellitus with hyperglycemia: Secondary | ICD-10-CM

## 2022-02-11 DIAGNOSIS — E559 Vitamin D deficiency, unspecified: Secondary | ICD-10-CM

## 2022-02-11 DIAGNOSIS — E538 Deficiency of other specified B group vitamins: Secondary | ICD-10-CM

## 2022-02-11 DIAGNOSIS — I10 Essential (primary) hypertension: Secondary | ICD-10-CM

## 2022-02-11 DIAGNOSIS — J309 Allergic rhinitis, unspecified: Secondary | ICD-10-CM | POA: Diagnosis not present

## 2022-02-11 DIAGNOSIS — Z0001 Encounter for general adult medical examination with abnormal findings: Secondary | ICD-10-CM | POA: Diagnosis not present

## 2022-02-11 MED ORDER — DAPAGLIFLOZIN PROPANEDIOL 5 MG PO TABS: 5.0000 mg | ORAL_TABLET | Freq: Every day | ORAL | 3 refills | Status: DC

## 2022-02-11 MED ORDER — AMLODIPINE BESYLATE 5 MG PO TABS: 5.0000 mg | ORAL_TABLET | Freq: Every day | ORAL | 3 refills | Status: DC

## 2022-02-11 MED ORDER — TRIAMCINOLONE ACETONIDE 55 MCG/ACT NA AERO: 2.0000 | INHALATION_SPRAY | Freq: Every day | NASAL | 12 refills | Status: AC

## 2022-02-11 NOTE — Progress Notes (Signed)
Patient ID: [name], male   DOB: [birth year], 66 y.o.   MRN: [id number] ? ? ? ?     Chief Complaint:: wellness exam and htn, allergies, low b12, and hld ? ?     HPI:  [name] is a 66 y.o. male here for wellness exam; decliens flu shot, covid booster, o/w up to date ?         ?              Also Pt denies chest pain, increased sob or doe, wheezing, orthopnea, PND, increased LE swelling, palpitations, dizziness or syncope.   Pt denies polydipsia, polyuria, or new focal neuro s/s.    Pt denies fever, wt loss, night sweats, loss of appetite, or other constitutional symptoms  no other new complaints  Trying to follow lower chol diet, does not want statin for now.  Taking B12 daily.  Does have several wks ongoing nasal allergy symptoms with clearish congestion, itch and sneezing, without fever, pain, ST, cough, swelling or wheezing. ?  ?Wt Readings from Last 3 Encounters:  ?02/11/22 256 lb (116.1 kg)  ?11/02/21 254 lb (115.2 kg)  ?06/15/21 253 lb 9.6 oz (115 kg)  ? ?BP Readings from Last 3 Encounters:  ?02/11/22 140/82  ?11/02/21 (!) 146/80  ?06/15/21 140/80  ? ?Immunization History  ?Administered Date(s) Administered  ? PFIZER(Purple Top)SARS-COV-2 Vaccination 07/26/2020, 08/04/2020, 08/16/2020, 09/29/2020, 02/09/2021, 03/14/2021  ? Pneumococcal Conjugate-13 12/15/2015, 03/14/2016  ? Pneumococcal Polysaccharide-23 03/19/2015  ? Td 02/14/2009  ? Tdap 03/29/2016  ? Zoster Recombinat (Shingrix) 07/03/2021, 10/21/2021  ? ?There are no preventive care reminders to display for this patient. ? ?  ? ?Past Medical History:  ?Diagnosis Date  ? ABNORMAL CHEST XRAY 2009  ? Biceps tendon rupture   ? DIABETES MELLITUS, TYPE II   ? ERECTILE DYSFUNCTION, MILD   ? Hyperlipidemia 07/14/2016  ? History, normal test results in 09/2018  ? HYPERTENSION   ? Hypogonadism male 03/08/2014  ? Hypogonadism, male   ? Overweight(278.02)   ? PSEUDOFOLLICULITIS BARBAE   ? PULMONARY NODULE, RIGHT UPPER LOBE 02/10/2009  ? MD just watching   ? ?Past Surgical History:  ?Procedure Laterality Date  ? COLONOSCOPY  11/2008  ? Ardis Hughs poylps  ? eye lid surgey Left   ? NASAL POLYP SURGERY    ? NASAL POLYP SURGERY  1980s  ? WISDOM TOOTH EXTRACTION    ? ? reports that he quit smoking about 30 years ago. His smoking use included cigarettes. He has a 18.00 pack-year smoking history. He has never used smokeless tobacco. He reports that he does not drink alcohol and does not use drugs. ?family history includes Cancer in his brother; Diabetes in his mother; Hypertension in his mother; Kidney failure in his father. ?No Known Allergies ?Current Outpatient Medications on File Prior to Visit  ?Medication Sig Dispense Refill  ? BD INSULIN SYRINGE U/F 31G X 5/16" 0.5 ML MISC     ? clindamycin (CLEOCIN T) 1 % external solution APPLY EXTERNALLY TO THE AFFECTED AREA TWICE DAILY 60 mL 5  ? Continuous Blood Gluc Receiver (FREESTYLE LIBRE 14 DAY READER) DEVI 1 Device by Does not apply route daily. Use as directed once daily E11.9 1 Device 0  ? Continuous Blood Gluc Sensor (FREESTYLE LIBRE 14 DAY SENSOR) MISC Apply 1 Device topically every 14 (fourteen) days. E11.9 6 each 3  ? losartan (COZAAR) 100 MG tablet Take 100 mg by mouth daily.    ? metFORMIN (GLUCOPHAGE) 1000  MG tablet Take 1 tablet (1,000 mg total) by mouth 2 (two) times daily. 180 tablet 3  ? Semaglutide, 2 MG/DOSE, 8 MG/3ML SOPN Inject 2 mg as directed once a week. 9 mL 3  ? testosterone (ANDROGEL) 50 MG/5GM (1%) GEL PLACE 5 GRAMS ONTO THE SKIN DAILY 450 g 1  ? topiramate (TOPAMAX) 50 MG tablet 1 tablet at bedtime. (Patient not taking: Reported on 02/11/2022) 30 tablet 5  ? ?No current facility-administered medications on file prior to visit.  ? ?     ROS:  All others reviewed and negative. ? ?Objective  ? ?     PE:  BP 140/82 (BP Location: Left Arm, Patient Position: Sitting, Cuff Size: Large)   Pulse 77   Temp 99.3 ?F (37.4 ?C) (Oral)   Ht '6\' 2"'$  (1.88 m)   Wt 256 lb (116.1 kg)   SpO2 100%   BMI 32.87 kg/m?  ? ?               Constitutional: Pt appears in NAD ?              HENT: Head: NCAT.  ?              Right Ear: External ear normal.   ?              Left Ear: External ear normal.  ?              Eyes: . Pupils are equal, round, and reactive to light. Conjunctivae and EOM are normal ?              Nose: without d/c or deformity ?              Neck: Neck supple. Gross normal ROM ?              Cardiovascular: Normal rate and regular rhythm.   ?              Pulmonary/Chest: Effort normal and breath sounds without rales or wheezing.  ?              Abd:  Soft, NT, ND, + BS, no organomegaly ?              Neurological: Pt is alert. At baseline orientation, motor grossly intact ?              Skin: Skin is warm. No rashes, no other new lesions, LE edema - none ?              Psychiatric: Pt behavior is normal without agitation  ? ?Micro: none ? ?Cardiac tracings I have personally interpreted today:  none ? ?Pertinent Radiological findings (summarize): none  ? ?Lab Results  ?Component Value Date  ? WBC 6.2 02/05/2022  ? HGB 12.8 (L) 02/05/2022  ? HCT 40.7 02/05/2022  ? PLT 138.0 (L) 02/05/2022  ? GLUCOSE 156 (H) 02/05/2022  ? CHOL 165 02/05/2022  ? TRIG 80.0 02/05/2022  ? HDL 43.60 02/05/2022  ? LDLCALC 106 (H) 02/05/2022  ? ALT 32 02/05/2022  ? AST 26 02/05/2022  ? NA 141 02/05/2022  ? K 4.4 02/05/2022  ? CL 104 02/05/2022  ? CREATININE 0.93 02/05/2022  ? BUN 11 02/05/2022  ? CO2 31 02/05/2022  ? TSH 2.78 02/05/2022  ? PSA 0.96 02/05/2022  ? HGBA1C 7.9 (H) 02/05/2022  ? MICROALBUR 3.5 (H) 02/05/2022  ? ?Assessment/Plan:  ?[name] is a 66 y.o. Black  or African American [2] male with  has a past medical history of ABNORMAL CHEST XRAY (2009), Biceps tendon rupture, DIABETES MELLITUS, TYPE II, ERECTILE DYSFUNCTION, MILD, Hyperlipidemia (07/14/2016), HYPERTENSION, Hypogonadism male (03/08/2014), Hypogonadism, male, RKYHCWCBJS(283.15), PSEUDOFOLLICULITIS BARBAE, and PULMONARY NODULE, RIGHT UPPER LOBE  (02/10/2009). ? ?Encounter for well adult exam with abnormal findings ?Age and sex appropriate education and counseling updated with regular exercise and diet ?Referrals for preventative services - none needed ?Immunizations addressed - declines flu shot, covid booster ?Smoking counseling  - none needed ?Evidence for depression or other mood disorder - none significant ?Most recent labs reviewed. ?I have personally reviewed and have noted: ?1) the patient's medical and social history ?2) The patient's current medications and supplements ?3) The patient's height, weight, and BMI have been recorded in the chart ? ? ?Vitamin D deficiency ?Last vitamin D ?Lab Results  ?Component Value Date  ? VD25OH 62.00 02/05/2022  ? ?Stable, cont oral replacement ? ? ?Vitamin B12 deficiency ?Lab Results  ?Component Value Date  ? VVOHYWVP71 1,414 (H) 02/05/2022  ? ?overcontrolled, cont oral replacement - b12 1000 mcg at lower frequency mon wed friday ? ?Hyperlipidemia ?Lab Results  ?Component Value Date  ? LDLCALC 106 (H) 02/05/2022  ? ?Uncontrolled, goal ldl < 70, pt to continue current low chol diet as declines statin or zetia for now ? ? ?Essential hypertension ?BP Readings from Last 3 Encounters:  ?02/11/22 140/82  ?11/02/21 (!) 146/80  ?06/15/21 140/80  ? ?Mld elevated, goal < 130/80, pt to add amlodipine 5 qd ? ? ?Diabetes ([place]) ?Lab Results  ?Component Value Date  ? HGBA1C 7.9 (H) 02/05/2022  ? ?Mild uncontrolled, pt to start farxiga, and continue metformin, ozempic as declines other change for now ? ? ?Allergic rhinitis ?Mild uncontrolled, for nasacort asd,  to f/u any worsening symptoms or concerns ? ?Followup: No follow-ups on file. ? ?Cathlean Cower, MD 02/16/2022 7:19 PM ?[facility] ?Internal Medicine ?

## 2022-02-11 NOTE — Patient Instructions (Addendum)
Please take all new medication as prescribed - the farxiga, and the amlodipine (sent to the [place]) ? ?Ok to take the Nasacort for the allergies as well ? ?Ok to decrease the B12 to mon- wed- fri ? ?Please call if you change your mind about the statin such as Lipitor for the cholesterol ? ?Please continue all other medications as before, and refills have been done if requested. ? ?Please have the pharmacy call with any other refills you may need. ? ?Please continue your efforts at being more active, low cholesterol diet, and weight control. ? ?You are otherwise up to date with prevention measures today. ? ?Please keep your appointments with your specialists as you may have planned - you other PCP at the [facility] in June 2023 ? ?Please make an Appointment to return in 6 months, or sooner if needed ? ? ? ? ?

## 2022-02-12 DIAGNOSIS — L84 Corns and callosities: Secondary | ICD-10-CM | POA: Diagnosis not present

## 2022-02-12 DIAGNOSIS — E1140 Type 2 diabetes mellitus with diabetic neuropathy, unspecified: Secondary | ICD-10-CM | POA: Diagnosis not present

## 2022-02-12 DIAGNOSIS — B351 Tinea unguium: Secondary | ICD-10-CM | POA: Diagnosis not present

## 2022-02-16 NOTE — Assessment & Plan Note (Signed)
Last vitamin D ?Lab Results  ?Component Value Date  ? VD25OH 62.00 02/05/2022  ? ?Stable, cont oral replacement ? ?

## 2022-02-16 NOTE — Assessment & Plan Note (Signed)
BP Readings from Last 3 Encounters:  ?02/11/22 140/82  ?11/02/21 (!) 146/80  ?06/15/21 140/80  ? ?Mld elevated, goal < 130/80, pt to add amlodipine 5 qd ? ?

## 2022-02-16 NOTE — Assessment & Plan Note (Addendum)
Lab Results  ?Component Value Date  ? HGBA1C 7.9 (H) 02/05/2022  ? ?Mild uncontrolled, pt to start farxiga, and continue metformin, ozempic as declines other change for now ? ?

## 2022-02-16 NOTE — Assessment & Plan Note (Signed)
Mild uncontrolled, for nasacort asd,  to f/u any worsening symptoms or concerns ? ?

## 2022-02-16 NOTE — Assessment & Plan Note (Signed)
Lab Results  ?Component Value Date  ? VJDYNXGZ35 1,414 (H) 02/05/2022  ? ?overcontrolled, cont oral replacement - b12 1000 mcg at lower frequency mon wed friday ?

## 2022-02-16 NOTE — Assessment & Plan Note (Signed)
Age and sex appropriate education and counseling updated with regular exercise and diet Referrals for preventative services - none needed Immunizations addressed - declines flu shot, covid booster Smoking counseling  - none needed Evidence for depression or other mood disorder - none significant Most recent labs reviewed. I have personally reviewed and have noted: 1) the patient's medical and social history 2) The patient's current medications and supplements 3) The patient's height, weight, and BMI have been recorded in the chart  

## 2022-02-16 NOTE — Assessment & Plan Note (Signed)
Lab Results  ?Component Value Date  ? LDLCALC 106 (H) 02/05/2022  ? ?Uncontrolled, goal ldl < 70, pt to continue current low chol diet as declines statin or zetia for now ? ?

## 2022-02-27 ENCOUNTER — Ambulatory Visit (HOSPITAL_COMMUNITY)
Admission: EM | Admit: 2022-02-27 | Discharge: 2022-02-27 | Disposition: A | Discharge status: Home / Self Care | Payer: No Typology Code available for payment source | Attending: Emergency Medicine | Admitting: Emergency Medicine

## 2022-02-27 ENCOUNTER — Encounter (HOSPITAL_COMMUNITY): Payer: Self-pay | Admitting: Emergency Medicine

## 2022-02-27 DIAGNOSIS — J36 Peritonsillar abscess: Secondary | ICD-10-CM

## 2022-02-27 MED ORDER — AMOXICILLIN-POT CLAVULANATE 875-125 MG PO TABS: 1.0000 | ORAL_TABLET | Freq: Two times a day (BID) | ORAL | 0 refills | Status: AC

## 2022-02-27 NOTE — ED Provider Notes (Signed)
?[facility] ? ? ? ?CSN: [id number] ?Arrival date & time: 02/27/22  1654 ?  ? ?HISTORY  ? ?Chief Complaint  ?Patient presents with  ? Mass  ? ?HPI ?[name] is a 66 y.o. male. Pt reports that for past several days he has had swelling on right side of his neck that spread to his face but states that overall, the swelling has gone down but is still present and tender on the right side of his neck.  Patient denies difficulty swallowing but has noticed that he is also had some pain in his right maxillary sinus as well associated with some pressure.  Patient states he has not been running a fever, has not had any other respiratory symptoms such as runny nose, cough, ear pain or congestion.  Patient states he has never had similar symptoms in the past.  Patient denies recent antibiotic use.  Patient reports a history of type 2 diabetes. ? ?The history is provided by the patient.  ?Past Medical History:  ?Diagnosis Date  ? ABNORMAL CHEST XRAY 2009  ? Biceps tendon rupture   ? DIABETES MELLITUS, TYPE II   ? ERECTILE DYSFUNCTION, MILD   ? Hyperlipidemia 07/14/2016  ? History, normal test results in 09/2018  ? HYPERTENSION   ? Hypogonadism male 03/08/2014  ? Hypogonadism, male   ? Overweight(278.02)   ? PSEUDOFOLLICULITIS BARBAE   ? PULMONARY NODULE, RIGHT UPPER LOBE 02/10/2009  ? MD just watching  ? ?Patient Active Problem List  ? Diagnosis Date Noted  ? Vitamin D deficiency 06/15/2021  ? Vitamin B12 deficiency 06/15/2021  ? Allergic rhinitis 09/26/2020  ? Spinal stenosis of lumbar region 04/15/2020  ? Pain and swelling of right lower leg 10/30/2019  ? Right lumbar radiculopathy 10/30/2019  ? Hearing loss 12/21/2018  ? Obstructive sleep apnea 12/18/2018  ? History of colon polyps 08/24/2017  ? Hyperlipidemia 07/14/2016  ? Hematuria 09/06/2014  ? Hypogonadism male 03/08/2014  ? Myalgia and myositis, unspecified 10/22/2012  ? Encounter for well adult exam with abnormal findings 10/22/2012  ? PULMONARY NODULE,  RIGHT UPPER LOBE 02/10/2009  ? Obesity, Class I, BMI 30-34.9 09/26/2008  ? Nonspecific (abnormal) findings on radiological and other examination of body structure 07/04/2008  ? Diabetes ([place]) 08/29/2007  ? Erectile dysfunction associated with type 2 diabetes mellitus ([place]) 08/29/2007  ? Essential hypertension 08/29/2007  ? PSEUDOFOLLICULITIS BARBAE 88/89/1694  ? ?Past Surgical History:  ?Procedure Laterality Date  ? COLONOSCOPY  11/2008  ? Ardis Hughs poylps  ? eye lid surgey Left   ? NASAL POLYP SURGERY    ? NASAL POLYP SURGERY  1980s  ? WISDOM TOOTH EXTRACTION    ? ? ?Home Medications   ? ?Prior to Admission medications   ?Medication Sig Start Date End Date Taking? Authorizing Provider  ?amLODipine (NORVASC) 5 MG tablet Take 1 tablet (5 mg total) by mouth daily. 02/11/22 02/11/23  Biagio Borg, MD  ?BD INSULIN SYRINGE U/F 31G X 5/16" 0.5 ML MISC  07/08/20   [provider]  ?clindamycin (CLEOCIN T) 1 % external solution APPLY EXTERNALLY TO THE AFFECTED AREA TWICE DAILY 02/12/21   Biagio Borg, MD  ?Continuous Blood Gluc Receiver (FREESTYLE LIBRE 14 DAY READER) DEVI 1 Device by Does not apply route daily. Use as directed once daily E11.9 06/05/19   Biagio Borg, MD  ?Continuous Blood Gluc Sensor (FREESTYLE LIBRE 14 DAY SENSOR) MISC Apply 1 Device topically every 14 (fourteen) days. E11.9 09/08/21   Cathlean Cower  W, MD  ?dapagliflozin propanediol (FARXIGA) 5 MG TABS tablet Take 1 tablet (5 mg total) by mouth daily before breakfast. 02/11/22   Biagio Borg, MD  ?losartan (COZAAR) 100 MG tablet Take 100 mg by mouth daily. 01/22/20   [provider]  ?metFORMIN (GLUCOPHAGE) 1000 MG tablet Take 1 tablet (1,000 mg total) by mouth 2 (two) times daily. 06/12/20   Biagio Borg, MD  ?Semaglutide, 2 MG/DOSE, 8 MG/3ML SOPN Inject 2 mg as directed once a week. 06/15/21   Biagio Borg, MD  ?testosterone (ANDROGEL) 50 MG/5GM (1%) GEL PLACE 5 GRAMS ONTO THE SKIN DAILY 12/19/20   Biagio Borg, MD  ?topiramate (TOPAMAX) 50 MG  tablet 1 tablet at bedtime. ?Patient not taking: Reported on 02/11/2022 12/11/21   Pieter Partridge, DO  ?triamcinolone (NASACORT) 55 MCG/ACT AERO nasal inhaler Place 2 sprays into the nose daily. 02/11/22   Biagio Borg, MD  ? ?Family History ?Family History  ?Problem Relation Age of Onset  ? Hypertension Mother   ? Diabetes Mother   ? Kidney failure Father   ? Cancer Brother   ?     Pancreatic  ? Coronary artery disease Neg Hx   ? Colon cancer Neg Hx   ? Rectal cancer Neg Hx   ? Stomach cancer Neg Hx   ? ?Social History ?Social History  ? ?Tobacco Use  ? Smoking status: Former  ?  Packs/day: 1.00  ?  Years: 18.00  ?  Pack years: 18.00  ?  Types: Cigarettes  ?  Quit date: 10/20/1991  ?  Years since quitting: 30.3  ? Smokeless tobacco: Never  ?Vaping Use  ? Vaping Use: Never used  ?Substance Use Topics  ? Alcohol use: No  ? Drug use: No  ? ?Allergies   ?Patient has no known allergies. ? ?Review of Systems ?Review of Systems ?Pertinent findings noted in history of present illness.  ? ?Physical Exam ?Triage Vital Signs ?ED Triage Vitals  ?Enc Vitals Group  ?   BP 09/25/21 0827 (!) 147/82  ?   Pulse Rate 09/25/21 0827 72  ?   Resp 09/25/21 0827 18  ?   Temp 09/25/21 0827 98.3 ?F (36.8 ?C)  ?   Temp Source 09/25/21 0827 Oral  ?   SpO2 09/25/21 0827 98 %  ?   Weight --   ?   Height --   ?   Head Circumference --   ?   Peak Flow --   ?   Pain Score 09/25/21 0826 5  ?   Pain Loc --   ?   Pain Edu? --   ?   Excl. in [place]? --   ?No data found. ? ?Updated Vital Signs ?BP (!) 154/90 (BP Location: Left Arm)   Pulse 66   Temp 98.7 ?F (37.1 ?C) (Oral)   Resp 17   SpO2 97%  ? ?Physical Exam ?Vitals and nursing note reviewed.  ?Constitutional:   ?   General: He is not in acute distress. ?   Appearance: Normal appearance. He is not ill-appearing.  ?HENT:  ?   Head: Normocephalic and atraumatic.  ?   Jaw: No tenderness or pain on movement.  ?   Salivary Glands: Right salivary gland is diffusely enlarged and tender. Left salivary gland  is not diffusely enlarged or tender.  ?   Right Ear: Hearing, tympanic membrane, ear canal and external ear normal. No drainage. No middle ear effusion. There is  no impacted cerumen. Tympanic membrane is not erythematous or bulging.  ?   Left Ear: Hearing, tympanic membrane, ear canal and external ear normal. No drainage.  No middle ear effusion. There is no impacted cerumen. Tympanic membrane is not erythematous or bulging.  ?   Nose: Nose normal. No nasal deformity, septal deviation, mucosal edema, congestion or rhinorrhea.  ?   Right Turbinates: Not enlarged, swollen or pale.  ?   Left Turbinates: Not enlarged, swollen or pale.  ?   Right Sinus: No maxillary sinus tenderness or frontal sinus tenderness.  ?   Left Sinus: No maxillary sinus tenderness or frontal sinus tenderness.  ?   Mouth/Throat:  ?   Lips: Pink. No lesions.  ?   Mouth: Mucous membranes are moist. No oral lesions.  ?   Pharynx: Oropharynx is clear. Uvula midline. No posterior oropharyngeal erythema or uvula swelling.  ?   Tonsils: No tonsillar exudate. 0 on the right. 0 on the left.  ?Eyes:  ?   General: Lids are normal.     ?   Right eye: No discharge.     ?   Left eye: No discharge.  ?   Extraocular Movements: Extraocular movements intact.  ?   Conjunctiva/sclera: Conjunctivae normal.  ?   Right eye: Right conjunctiva is not injected.  ?   Left eye: Left conjunctiva is not injected.  ?Neck:  ?   Trachea: Trachea and phonation normal.  ?Cardiovascular:  ?   Rate and Rhythm: Normal rate and regular rhythm.  ?   Pulses: Normal pulses.  ?   Heart sounds: Normal heart sounds. No murmur heard. ?  No friction rub. No gallop.  ?Pulmonary:  ?   Effort: Pulmonary effort is normal. No accessory muscle usage, prolonged expiration or respiratory distress.  ?   Breath sounds: Normal breath sounds. No stridor, decreased air movement or transmitted upper airway sounds. No decreased breath sounds, wheezing, rhonchi or rales.  ?Chest:  ?   Chest wall: No  tenderness.  ?Musculoskeletal:     ?   General: Normal range of motion.  ?   Cervical back: Normal range of motion and neck supple. Normal range of motion.  ?Lymphadenopathy:  ?   Cervical: Cervical adenopathy pre

## 2022-02-27 NOTE — ED Triage Notes (Signed)
Pt reports that for past several days had swelling on right side of face and neck but has gone down but still has swelling/mass to right neck. Denies pain.  ?

## 2022-02-27 NOTE — Discharge Instructions (Signed)
To treat the infection on the right side of your throat it is likely an abscess near your right tonsil, I recommend that you begin Augmentin, 1 tablet twice daily for the next 7 days.  Please follow-up with your primary care provider if you have not seen complete resolution of your symptoms after 7 days of treatment.  You are also welcome to follow-up here at urgent care. ? ?Thank you for visiting urgent care today.  I appreciate the opportunity to participate in your care. ?

## 2022-03-15 DIAGNOSIS — R040 Epistaxis: Secondary | ICD-10-CM | POA: Diagnosis not present

## 2022-03-18 ENCOUNTER — Other Ambulatory Visit: Payer: Self-pay | Admitting: Internal Medicine

## 2022-04-05 DIAGNOSIS — Z0189 Encounter for other specified special examinations: Secondary | ICD-10-CM | POA: Diagnosis not present

## 2022-06-02 NOTE — Progress Notes (Signed)
[facility] referral- Renewal sent to the [place].

## 2022-06-04 NOTE — Progress Notes (Deleted)
NEUROLOGY FOLLOW UP OFFICE NOTE  [name] [id number]  Assessment/Plan:   Migraine without aura, without status migrainosus, not intractable   MRI of brain with and without contrast Migraine prevention:  start topiramate '25mg'$  at bedtime for one week, then increase to '50mg'$  at bedtime Migraine rescue:  Stop sumatriptan.  Nurtec. Limit use of pain relievers to no more than 2 days out of week to prevent risk of rebound or medication-overuse headache. Keep headache diary Follow up 7-8 months.       Subjective:  [name] is a 66 year old male with DM II and HTN who follows up for migraine  UPDATE: MRI of brain was ordered but ***  Started topiramate.  Nurtec Intensity:  *** Duration:  *** Frequency:  *** Frequency of abortive medication: *** Current NSAIDS/analgesics:  Tylenol, Excedrin, BC, Goody Current triptans:  none Current ergotamine:  none Current anti-emetic:  none Current muscle relaxants:  none Current Antihypertensive medications:  losartan Current Antidepressant medications:  none Current Anticonvulsant medications:  topiramate '50mg'$  QHS Current anti-CGRP:  Nurtec Current Vitamins/Herbal/Supplements:  none Current Antihistamines/Decongestants:  cetirizine Other therapy:  none  Caffeine:  No coffee, soda, tea Diet:  24-36 oz water daily.  Does not skip meals Exercise:  not routine.  A little bit of walking Depression:  no; Anxiety:  sometimes Other pain:  no Sleep hygiene:  varies  HISTORY: Onset:  headaches really started around early 2021.   Location:  across forehead Quality:  pressure, rarely throbbing Intensity:  5-6/10.   Aura:  absent Prodrome:  absent Associated symptoms:  Nausea, photophobia, blurred vision.  He denies associated vomiting, phonophobia, unilateral numbness or weakness. Duration:  1 to 2 hours Frequency:  2-3 times a week Frequency of abortive medication: 3 days a week Triggers:  none Relieving factors:   rest Activity:  sometimes cannot function   Headaches were evaluated with CT head on 03/05/2021 which showed a partially empty sella.   He reports that he has an upcoming eye exam.       Past NSAIDS/analgesics:  ibuprofen Past abortive triptans:  sumatriptan tab Past abortive ergotamine:  none Past muscle relaxants:  none Past anti-emetic:  none Past antihypertensive medications:  carvidelol, isosorbide Past antidepressant medications:  sertraline '200mg'$  daily, trazodone '50mg'$  QHS (insomnia) Past anticonvulsant medications:  none Past anti-CGRP:  none Past vitamins/Herbal/Supplements:  none Past antihistamines/decongestants:  none Other past therapies:  none    Family history of headache:  No.  No known family history of aneurysms.  PAST MEDICAL HISTORY: Past Medical History:  Diagnosis Date   ABNORMAL CHEST XRAY 2009   Biceps tendon rupture    DIABETES MELLITUS, TYPE II    ERECTILE DYSFUNCTION, MILD    Hyperlipidemia 07/14/2016   History, normal test results in 09/2018   HYPERTENSION    Hypogonadism male 03/08/2014   Hypogonadism, male    [id number](329.51)    PSEUDOFOLLICULITIS BARBAE    PULMONARY NODULE, RIGHT UPPER LOBE 02/10/2009   MD just watching    MEDICATIONS: Current Outpatient Medications on File Prior to Visit  Medication Sig Dispense Refill   amLODipine (NORVASC) 5 MG tablet Take 1 tablet (5 mg total) by mouth daily. 90 tablet 3   BD INSULIN SYRINGE U/F 31G X 5/16" 0.5 ML MISC      clindamycin (CLEOCIN T) 1 % external solution APPLY TOPICALLY TO THE AFFECTED AREA TWICE DAILY 60 mL 5   Continuous Blood Gluc Receiver (FREESTYLE LIBRE 14 DAY READER) DEVI 1  Device by Does not apply route daily. Use as directed once daily E11.9 1 Device 0   Continuous Blood Gluc Sensor (FREESTYLE LIBRE 14 DAY SENSOR) MISC Apply 1 Device topically every 14 (fourteen) days. E11.9 6 each 3   dapagliflozin propanediol (FARXIGA) 5 MG TABS tablet Take 1 tablet (5 mg total) by mouth daily  before breakfast. 90 tablet 3   losartan (COZAAR) 100 MG tablet Take 100 mg by mouth daily.     metFORMIN (GLUCOPHAGE) 1000 MG tablet Take 1 tablet (1,000 mg total) by mouth 2 (two) times daily. 180 tablet 3   Semaglutide, 2 MG/DOSE, 8 MG/3ML SOPN Inject 2 mg as directed once a week. 9 mL 3   testosterone (ANDROGEL) 50 MG/5GM (1%) GEL PLACE 5 GRAMS ONTO THE SKIN DAILY 450 g 1   topiramate (TOPAMAX) 50 MG tablet 1 tablet at bedtime. (Patient not taking: Reported on 02/11/2022) 30 tablet 5   triamcinolone (NASACORT) 55 MCG/ACT AERO nasal inhaler Place 2 sprays into the nose daily. 1 each 12   No current facility-administered medications on file prior to visit.    ALLERGIES: No Known Allergies  FAMILY HISTORY: Family History  Problem Relation Age of Onset   Hypertension Mother    Diabetes Mother    Kidney failure Father    Cancer Brother        Pancreatic   Coronary artery disease Neg Hx    Colon cancer Neg Hx    Rectal cancer Neg Hx    Stomach cancer Neg Hx       Objective:  *** General: No acute distress.  Patient appears well-groomed.   Head:  Normocephalic/atraumatic Eyes:  Fundi examined but not visualized Neck: supple, no paraspinal tenderness, full range of motion Heart:  Regular rate and rhythm Lungs:  Clear to auscultation bilaterally Back: No paraspinal tenderness Neurological Exam: alert and oriented to person, place, and time.  Speech fluent and not dysarthric, language intact.  CN II-XII intact. Bulk and tone normal, muscle strength 5/5 throughout.  Sensation to light touch intact.  Deep tendon reflexes 2+ throughout, toes downgoing.  Finger to nose testing intact.  Gait normal, Romberg negative.   Metta Clines, DO  CC: Cathlean Cower, MD

## 2022-06-07 ENCOUNTER — Ambulatory Visit: Payer: No Typology Code available for payment source | Admitting: Neurology

## 2022-06-08 NOTE — Progress Notes (Signed)
Telephone call from [place] at the [place], Patient Referral Pending. Patient due for a Check up with his PCP in August so the Original Referral is about to Expire. So she will need to get Approval from the PCP.

## 2022-06-14 DIAGNOSIS — I10 Essential (primary) hypertension: Secondary | ICD-10-CM | POA: Diagnosis not present

## 2022-06-14 DIAGNOSIS — R519 Headache, unspecified: Secondary | ICD-10-CM | POA: Diagnosis not present

## 2022-06-14 DIAGNOSIS — E291 Testicular hypofunction: Secondary | ICD-10-CM | POA: Diagnosis not present

## 2022-06-14 DIAGNOSIS — E119 Type 2 diabetes mellitus without complications: Secondary | ICD-10-CM | POA: Diagnosis not present

## 2022-06-17 DIAGNOSIS — R040 Epistaxis: Secondary | ICD-10-CM | POA: Diagnosis not present

## 2022-06-23 DIAGNOSIS — E113212 Type 2 diabetes mellitus with mild nonproliferative diabetic retinopathy with macular edema, left eye: Secondary | ICD-10-CM | POA: Diagnosis not present

## 2022-06-23 DIAGNOSIS — E113291 Type 2 diabetes mellitus with mild nonproliferative diabetic retinopathy without macular edema, right eye: Secondary | ICD-10-CM | POA: Diagnosis not present

## 2022-06-23 DIAGNOSIS — H2513 Age-related nuclear cataract, bilateral: Secondary | ICD-10-CM | POA: Diagnosis not present

## 2022-06-23 DIAGNOSIS — H35033 Hypertensive retinopathy, bilateral: Secondary | ICD-10-CM | POA: Diagnosis not present

## 2022-06-24 DIAGNOSIS — R040 Epistaxis: Secondary | ICD-10-CM | POA: Diagnosis not present

## 2022-06-24 DIAGNOSIS — J323 Chronic sphenoidal sinusitis: Secondary | ICD-10-CM | POA: Diagnosis not present

## 2022-06-24 DIAGNOSIS — J330 Polyp of nasal cavity: Secondary | ICD-10-CM | POA: Diagnosis not present

## 2022-07-12 DIAGNOSIS — E785 Hyperlipidemia, unspecified: Secondary | ICD-10-CM | POA: Diagnosis not present

## 2022-07-12 DIAGNOSIS — I2510 Atherosclerotic heart disease of native coronary artery without angina pectoris: Secondary | ICD-10-CM | POA: Diagnosis not present

## 2022-07-12 DIAGNOSIS — I10 Essential (primary) hypertension: Secondary | ICD-10-CM | POA: Diagnosis not present

## 2022-07-12 DIAGNOSIS — Z79899 Other long term (current) drug therapy: Secondary | ICD-10-CM | POA: Diagnosis not present

## 2022-07-13 DIAGNOSIS — J338 Other polyp of sinus: Secondary | ICD-10-CM | POA: Diagnosis not present

## 2022-07-13 DIAGNOSIS — R040 Epistaxis: Secondary | ICD-10-CM | POA: Diagnosis not present

## 2022-08-03 DIAGNOSIS — G43909 Migraine, unspecified, not intractable, without status migrainosus: Secondary | ICD-10-CM | POA: Diagnosis not present

## 2022-08-03 DIAGNOSIS — I519 Heart disease, unspecified: Secondary | ICD-10-CM | POA: Diagnosis not present

## 2022-08-16 NOTE — Progress Notes (Signed)
Resent referral for Approval

## 2022-08-19 ENCOUNTER — Ambulatory Visit: Payer: No Typology Code available for payment source | Admitting: Internal Medicine

## 2022-08-24 NOTE — Progress Notes (Signed)
Per Tammy at the [facility], No referral added when patient chart to see Dr.Jaffe.  Patient saw his PCP August he did not ask or they Denied referral to [facility]. Please call patient.    Spoke to patient, Per Patient Referral sent to the Neurologist in the [place], no longer need 09/06/22 visit with Dr.Jaffe.   Will let front desk know to cancel appt.

## 2022-09-06 ENCOUNTER — Ambulatory Visit: Payer: No Typology Code available for payment source | Admitting: Neurology

## 2022-09-14 ENCOUNTER — Telehealth: Payer: Self-pay

## 2022-09-14 DIAGNOSIS — E559 Vitamin D deficiency, unspecified: Secondary | ICD-10-CM

## 2022-09-14 DIAGNOSIS — E1165 Type 2 diabetes mellitus with hyperglycemia: Secondary | ICD-10-CM

## 2022-09-14 DIAGNOSIS — E538 Deficiency of other specified B group vitamins: Secondary | ICD-10-CM

## 2022-09-14 NOTE — Addendum Note (Signed)
Addended by: Biagio Borg on: 09/14/2022 02:21 PM   Modules accepted: Orders

## 2022-09-14 NOTE — Telephone Encounter (Signed)
Patient is calling in asking if Dr.John wants him to do blood work prior to his appointment.

## 2022-09-14 NOTE — Telephone Encounter (Signed)
Patient is scheduled to see you on 10/25 and would like to know if he should have labs drawn before then, please advise.

## 2022-09-14 NOTE — Telephone Encounter (Signed)
Ok labs ordered 

## 2022-09-16 NOTE — Telephone Encounter (Signed)
Left message for a call back regarding labs ordered prior to upcoming visit.

## 2022-09-16 NOTE — Telephone Encounter (Signed)
Patient informed of lab orders

## 2022-09-17 ENCOUNTER — Other Ambulatory Visit (INDEPENDENT_AMBULATORY_CARE_PROVIDER_SITE_OTHER): Payer: Medicare Other

## 2022-09-17 DIAGNOSIS — E1165 Type 2 diabetes mellitus with hyperglycemia: Secondary | ICD-10-CM

## 2022-09-17 DIAGNOSIS — E538 Deficiency of other specified B group vitamins: Secondary | ICD-10-CM

## 2022-09-17 DIAGNOSIS — E559 Vitamin D deficiency, unspecified: Secondary | ICD-10-CM

## 2022-09-17 LAB — HEPATIC FUNCTION PANEL
ALT: 33 U/L (ref 0–53)
AST: 26 U/L (ref 0–37)
Albumin: 4.4 g/dL (ref 3.5–5.2)
Alkaline Phosphatase: 54 U/L (ref 39–117)
Bilirubin, Direct: 0.1 mg/dL (ref 0.0–0.3)
Total Bilirubin: 0.3 mg/dL (ref 0.2–1.2)
Total Protein: 7.8 g/dL (ref 6.0–8.3)

## 2022-09-17 LAB — BASIC METABOLIC PANEL
BUN: 12 mg/dL (ref 6–23)
CO2: 24 mEq/L (ref 19–32)
Calcium: 9.8 mg/dL (ref 8.4–10.5)
Chloride: 107 mEq/L (ref 96–112)
Creatinine, Ser: 0.92 mg/dL (ref 0.40–1.50)
GFR: 87.31 mL/min (ref >60.00)
Glucose, Bld: 127 mg/dL — ABNORMAL HIGH (ref 70–99)
Potassium: 4.2 mEq/L (ref 3.5–5.1)
Sodium: 143 mEq/L (ref 135–145)

## 2022-09-17 LAB — VITAMIN B12: Vitamin B-12: 892 pg/mL (ref 211–911)

## 2022-09-17 LAB — LIPID PANEL
Cholesterol: 149 mg/dL (ref 0–200)
HDL: 46 mg/dL (ref >39.00)
LDL Cholesterol: 91 mg/dL (ref 0–99)
NonHDL: 102.85
Total CHOL/HDL Ratio: 3
Triglycerides: 57 mg/dL (ref 0.0–149.0)
VLDL: 11.4 mg/dL (ref 0.0–40.0)

## 2022-09-17 LAB — VITAMIN D 25 HYDROXY (VIT D DEFICIENCY, FRACTURES): VITD: 58.57 ng/mL (ref 30.00–100.00)

## 2022-09-17 LAB — HEMOGLOBIN A1C: Hgb A1c MFr Bld: 8.4 % — ABNORMAL HIGH (ref 4.6–6.5)

## 2022-09-22 ENCOUNTER — Ambulatory Visit (INDEPENDENT_AMBULATORY_CARE_PROVIDER_SITE_OTHER): Payer: Medicare Other | Admitting: Internal Medicine

## 2022-09-22 ENCOUNTER — Encounter: Payer: Self-pay | Admitting: Internal Medicine

## 2022-09-22 VITALS — BP 126/78 | HR 96 | Temp 98.3°F | Ht 74.0 in | Wt 257.0 lb

## 2022-09-22 DIAGNOSIS — E1165 Type 2 diabetes mellitus with hyperglycemia: Secondary | ICD-10-CM | POA: Diagnosis not present

## 2022-09-22 DIAGNOSIS — Z125 Encounter for screening for malignant neoplasm of prostate: Secondary | ICD-10-CM | POA: Diagnosis not present

## 2022-09-22 DIAGNOSIS — E7800 Pure hypercholesterolemia, unspecified: Secondary | ICD-10-CM | POA: Diagnosis not present

## 2022-09-22 DIAGNOSIS — E559 Vitamin D deficiency, unspecified: Secondary | ICD-10-CM

## 2022-09-22 DIAGNOSIS — E538 Deficiency of other specified B group vitamins: Secondary | ICD-10-CM

## 2022-09-22 DIAGNOSIS — I10 Essential (primary) hypertension: Secondary | ICD-10-CM

## 2022-09-22 MED ORDER — ROSUVASTATIN CALCIUM 10 MG PO TABS: 10.0000 mg | ORAL_TABLET | Freq: Every day | ORAL | 3 refills | Status: DC

## 2022-09-22 NOTE — Patient Instructions (Addendum)
Please take all new medication as prescribed - the crestor 10 mg per day for cholesterol  Please suggest to your [place] provider about increasing the farxiga  Please continue all other medications as before, and refills have been done if requested.  Please have the pharmacy call with any other refills you may need.  Please continue your efforts at being more active, low cholesterol diet, and weight control.  Please keep your appointments with your specialists as you may have planned  Please make an Appointment to return in 6 months, or sooner if needed, also with Lab Appointment for testing done 3-5 days before at the [place] (so this is for TWO appointments - please see the scheduling desk as you leave)

## 2022-09-22 NOTE — Assessment & Plan Note (Signed)
BP Readings from Last 3 Encounters:  09/22/22 126/78  02/27/22 (!) 154/90  02/11/22 140/82   Stable, pt to continue medical treatment norvasc 5 mg qd, losartan 100 mg qd

## 2022-09-22 NOTE — Assessment & Plan Note (Signed)
Lab Results  Component Value Date   HGBA1C 8.4 (H) 09/17/2022   uncontrolled, pt to continue current medical treatment farxiga 5 mg qd, metformin 100 bd, and ozemipic 1 qd - I suggested increased farxiga but he wants to d/w [place] provider as well

## 2022-09-22 NOTE — Progress Notes (Signed)
Patient ID: [name], male   DOB: [birth year], 66 y.o.   MRN: [id number]        Chief Complaint: follow up HTN, HLD , DM       HPI:  [name] is a 66 y.o. male here overall doing ok, also followed at the [place] for DM, Pt denies chest pain, increased sob or doe, wheezing, orthopnea, PND, increased LE swelling, palpitations, dizziness or syncope.   Pt denies polydipsia, polyuria, or new focal neuro s/s.    Pt denies fever, wt loss, night sweats, loss of appetite, or other constitutional symptoms  [facility] - he is hoping for higher dose.  Spending more time less active due to wife illness spine surgury, has been nervous and overeating, gained several lbs.   Wt Readings from Last 3 Encounters:  09/22/22 257 lb (116.6 kg)  02/11/22 256 lb (116.1 kg)  11/02/21 254 lb (115.2 kg)   BP Readings from Last 3 Encounters:  09/22/22 126/78  02/27/22 (!) 154/90  02/11/22 140/82         Past Medical History:  Diagnosis Date   ABNORMAL CHEST XRAY 2009   Biceps tendon rupture    DIABETES MELLITUS, TYPE II    ERECTILE DYSFUNCTION, MILD    Hyperlipidemia 07/14/2016   History, normal test results in 09/2018   HYPERTENSION    Hypogonadism male 03/08/2014   Hypogonadism, male    [id number](025.42)    PSEUDOFOLLICULITIS BARBAE    PULMONARY NODULE, RIGHT UPPER LOBE 02/10/2009   MD just watching   Past Surgical History:  Procedure Laterality Date   COLONOSCOPY  11/2008   Ardis Hughs poylps   eye lid surgey Left    NASAL POLYP SURGERY     NASAL POLYP SURGERY  1980s   WISDOM TOOTH EXTRACTION      reports that he quit smoking about 30 years ago. His smoking use included cigarettes. He has a 18.00 pack-year smoking history. He has never used smokeless tobacco. He reports that he does not drink alcohol and does not use drugs. family history includes Cancer in his brother; Diabetes in his mother; Hypertension in his mother; Kidney failure  in his father. No Known Allergies Current Outpatient Medications on File Prior to Visit  Medication Sig Dispense Refill   amLODipine (NORVASC) 5 MG tablet Take 1 tablet (5 mg total) by mouth daily. 90 tablet 3   BD INSULIN SYRINGE U/F 31G X 5/16" 0.5 ML MISC      clindamycin (CLEOCIN T) 1 % external solution APPLY TOPICALLY TO THE AFFECTED AREA TWICE DAILY 60 mL 5   Continuous Blood Gluc Receiver (FREESTYLE LIBRE 14 DAY READER) DEVI 1 Device by Does not apply route daily. Use as directed once daily E11.9 1 Device 0   Continuous Blood Gluc Sensor (FREESTYLE LIBRE 14 DAY SENSOR) MISC Apply 1 Device topically every 14 (fourteen) days. E11.9 6 each 3   dapagliflozin propanediol (FARXIGA) 5 MG TABS tablet Take 1 tablet (5 mg total) by mouth daily before breakfast. 90 tablet 3   losartan (COZAAR) 100 MG tablet Take 100 mg by mouth daily.     metFORMIN (GLUCOPHAGE) 1000 MG tablet Take 1 tablet (1,000 mg total) by mouth 2 (two) times daily. 180 tablet 3   Semaglutide, 2 MG/DOSE, 8 MG/3ML SOPN Inject 2 mg as directed once a week. 9 mL 3   testosterone (ANDROGEL) 50 MG/5GM (1%) GEL PLACE 5 GRAMS ONTO THE  SKIN DAILY 450 g 1   topiramate (TOPAMAX) 50 MG tablet 1 tablet at bedtime. 30 tablet 5   triamcinolone (NASACORT) 55 MCG/ACT AERO nasal inhaler Place 2 sprays into the nose daily. 1 each 12   No current facility-administered medications on file prior to visit.        ROS:  All others reviewed and negative.  Objective        PE:  BP 126/78   Pulse 96   Temp 98.3 F (36.8 C)   Ht '6\' 2"'$  (1.88 m)   Wt 257 lb (116.6 kg)   SpO2 99%   BMI 33.00 kg/m                 Constitutional: Pt appears in NAD               HENT: Head: NCAT.                Right Ear: External ear normal.                 Left Ear: External ear normal.                Eyes: . Pupils are equal, round, and reactive to light. Conjunctivae and EOM are normal               Nose: without d/c or deformity               Neck: Neck  supple. Gross normal ROM               Cardiovascular: Normal rate and regular rhythm.                 Pulmonary/Chest: Effort normal and breath sounds without rales or wheezing.                Abd:  Soft, NT, ND, + BS, no organomegaly               Neurological: Pt is alert. At baseline orientation, motor grossly intact               Skin: Skin is warm. No rashes, no other new lesions, LE edema - none               Psychiatric: Pt behavior is normal without agitation   Micro: none  Cardiac tracings I have personally interpreted today:  none  Pertinent Radiological findings (summarize): none   Lab Results  Component Value Date   WBC 6.2 02/05/2022   HGB 12.8 (L) 02/05/2022   HCT 40.7 02/05/2022   PLT 138.0 (L) 02/05/2022   GLUCOSE 127 (H) 09/17/2022   CHOL 149 09/17/2022   TRIG 57.0 09/17/2022   HDL 46.00 09/17/2022   LDLCALC 91 09/17/2022   ALT 33 09/17/2022   AST 26 09/17/2022   NA 143 09/17/2022   K 4.2 09/17/2022   CL 107 09/17/2022   CREATININE 0.92 09/17/2022   BUN 12 09/17/2022   CO2 24 09/17/2022   TSH 2.78 02/05/2022   PSA 0.96 02/05/2022   HGBA1C 8.4 (H) 09/17/2022   MICROALBUR 3.5 (H) 02/05/2022   Assessment/Plan:  [name] is a 66 y.o. Black or African American [2] male with  has a past medical history of ABNORMAL CHEST XRAY (2009), Biceps tendon rupture, DIABETES MELLITUS, TYPE II, ERECTILE DYSFUNCTION, MILD, Hyperlipidemia (07/14/2016), HYPERTENSION, Hypogonadism male (03/08/2014), Hypogonadism, male, [id number](245.80), PSEUDOFOLLICULITIS BARBAE, and PULMONARY NODULE, RIGHT UPPER LOBE (02/10/2009).  Essential hypertension  BP Readings from Last 3 Encounters:  09/22/22 126/78  02/27/22 (!) 154/90  02/11/22 140/82   Stable, pt to continue medical treatment norvasc 5 mg qd, losartan 100 mg qd   Diabetes ([place]) Lab Results  Component Value Date   HGBA1C 8.4 (H) 09/17/2022   uncontrolled, pt to continue current medical treatment farxiga 5 mg qd,  metformin 100 bd, and ozemipic 1 qd - I suggested increased farxiga but he wants to d/w [place] provider as well    Hyperlipidemia Lab Results  Component Value Date   Bland 91 09/17/2022   Uncontrolled, has hx of aortic atherosclerosis,, pt to start crestor 10 mg qd  Followup: No follow-ups on file.  Cathlean Cower, MD 09/22/2022 9:08 PM [facility]

## 2022-09-22 NOTE — Assessment & Plan Note (Signed)
Lab Results  Component Value Date   LDLCALC 91 09/17/2022   Uncontrolled, has hx of aortic atherosclerosis,, pt to start crestor 10 mg qd

## 2022-10-13 DIAGNOSIS — I10 Essential (primary) hypertension: Secondary | ICD-10-CM | POA: Diagnosis not present

## 2022-10-13 DIAGNOSIS — R519 Headache, unspecified: Secondary | ICD-10-CM | POA: Diagnosis not present

## 2022-10-13 DIAGNOSIS — E119 Type 2 diabetes mellitus without complications: Secondary | ICD-10-CM | POA: Diagnosis not present

## 2022-10-13 DIAGNOSIS — E291 Testicular hypofunction: Secondary | ICD-10-CM | POA: Diagnosis not present

## 2022-11-16 DIAGNOSIS — Z7189 Other specified counseling: Secondary | ICD-10-CM | POA: Diagnosis not present

## 2022-12-10 DIAGNOSIS — F4320 Adjustment disorder, unspecified: Secondary | ICD-10-CM | POA: Diagnosis not present

## 2022-12-10 DIAGNOSIS — F39 Unspecified mood [affective] disorder: Secondary | ICD-10-CM | POA: Diagnosis not present

## 2022-12-10 DIAGNOSIS — F411 Generalized anxiety disorder: Secondary | ICD-10-CM | POA: Diagnosis not present

## 2023-01-03 DIAGNOSIS — Z5181 Encounter for therapeutic drug level monitoring: Secondary | ICD-10-CM | POA: Diagnosis not present

## 2023-01-03 DIAGNOSIS — E785 Hyperlipidemia, unspecified: Secondary | ICD-10-CM | POA: Diagnosis not present

## 2023-01-03 DIAGNOSIS — I2510 Atherosclerotic heart disease of native coronary artery without angina pectoris: Secondary | ICD-10-CM | POA: Diagnosis not present

## 2023-01-03 DIAGNOSIS — Z7984 Long term (current) use of oral hypoglycemic drugs: Secondary | ICD-10-CM | POA: Diagnosis not present

## 2023-01-03 DIAGNOSIS — Z794 Long term (current) use of insulin: Secondary | ICD-10-CM | POA: Diagnosis not present

## 2023-01-03 DIAGNOSIS — Z79899 Other long term (current) drug therapy: Secondary | ICD-10-CM | POA: Diagnosis not present

## 2023-01-03 DIAGNOSIS — I10 Essential (primary) hypertension: Secondary | ICD-10-CM | POA: Diagnosis not present

## 2023-01-03 DIAGNOSIS — Z7985 Long-term (current) use of injectable non-insulin antidiabetic drugs: Secondary | ICD-10-CM | POA: Diagnosis not present

## 2023-01-11 DIAGNOSIS — Z79899 Other long term (current) drug therapy: Secondary | ICD-10-CM | POA: Diagnosis not present

## 2023-01-11 DIAGNOSIS — H2513 Age-related nuclear cataract, bilateral: Secondary | ICD-10-CM | POA: Diagnosis not present

## 2023-01-11 DIAGNOSIS — H04123 Dry eye syndrome of bilateral lacrimal glands: Secondary | ICD-10-CM | POA: Diagnosis not present

## 2023-01-11 DIAGNOSIS — E113212 Type 2 diabetes mellitus with mild nonproliferative diabetic retinopathy with macular edema, left eye: Secondary | ICD-10-CM | POA: Diagnosis not present

## 2023-02-09 DIAGNOSIS — E119 Type 2 diabetes mellitus without complications: Secondary | ICD-10-CM | POA: Diagnosis not present

## 2023-02-09 DIAGNOSIS — E291 Testicular hypofunction: Secondary | ICD-10-CM | POA: Diagnosis not present

## 2023-02-09 DIAGNOSIS — I10 Essential (primary) hypertension: Secondary | ICD-10-CM | POA: Diagnosis not present

## 2023-02-09 DIAGNOSIS — R519 Headache, unspecified: Secondary | ICD-10-CM | POA: Diagnosis not present

## 2023-02-15 DIAGNOSIS — E119 Type 2 diabetes mellitus without complications: Secondary | ICD-10-CM | POA: Diagnosis not present

## 2023-02-15 DIAGNOSIS — R519 Headache, unspecified: Secondary | ICD-10-CM | POA: Diagnosis not present

## 2023-02-15 DIAGNOSIS — I10 Essential (primary) hypertension: Secondary | ICD-10-CM | POA: Diagnosis not present

## 2023-02-15 DIAGNOSIS — E785 Hyperlipidemia, unspecified: Secondary | ICD-10-CM | POA: Diagnosis not present

## 2023-03-22 ENCOUNTER — Other Ambulatory Visit: Payer: Self-pay | Admitting: Internal Medicine

## 2023-03-24 ENCOUNTER — Ambulatory Visit: Payer: No Typology Code available for payment source | Admitting: Internal Medicine

## 2023-03-25 ENCOUNTER — Other Ambulatory Visit: Payer: Self-pay

## 2023-03-30 ENCOUNTER — Other Ambulatory Visit (INDEPENDENT_AMBULATORY_CARE_PROVIDER_SITE_OTHER): Payer: Medicare Other

## 2023-03-30 DIAGNOSIS — Z125 Encounter for screening for malignant neoplasm of prostate: Secondary | ICD-10-CM

## 2023-03-30 DIAGNOSIS — E538 Deficiency of other specified B group vitamins: Secondary | ICD-10-CM

## 2023-03-30 DIAGNOSIS — E291 Testicular hypofunction: Secondary | ICD-10-CM

## 2023-03-30 DIAGNOSIS — E559 Vitamin D deficiency, unspecified: Secondary | ICD-10-CM | POA: Diagnosis not present

## 2023-03-30 DIAGNOSIS — E1165 Type 2 diabetes mellitus with hyperglycemia: Secondary | ICD-10-CM | POA: Diagnosis not present

## 2023-03-30 LAB — PSA: PSA: 1.06 ng/mL (ref 0.10–4.00)

## 2023-03-30 LAB — BASIC METABOLIC PANEL
BUN: 12 mg/dL (ref 6–23)
CO2: 27 mEq/L (ref 19–32)
Calcium: 9.3 mg/dL (ref 8.4–10.5)
Chloride: 106 mEq/L (ref 96–112)
Creatinine, Ser: 0.89 mg/dL (ref 0.40–1.50)
GFR: 89.61 mL/min (ref >60.00)
Glucose, Bld: 108 mg/dL — ABNORMAL HIGH (ref 70–99)
Potassium: 4.1 mEq/L (ref 3.5–5.1)
Sodium: 141 mEq/L (ref 135–145)

## 2023-03-30 LAB — MICROALBUMIN / CREATININE URINE RATIO
Creatinine,U: 223.1 mg/dL
Microalb Creat Ratio: 0.8 mg/g (ref 0.0–30.0)
Microalb, Ur: 1.7 mg/dL (ref 0.0–1.9)

## 2023-03-30 LAB — LIPID PANEL
Cholesterol: 140 mg/dL (ref 0–200)
HDL: 44.5 mg/dL (ref >39.00)
LDL Cholesterol: 86 mg/dL (ref 0–99)
NonHDL: 95.31
Total CHOL/HDL Ratio: 3
Triglycerides: 49 mg/dL (ref 0.0–149.0)
VLDL: 9.8 mg/dL (ref 0.0–40.0)

## 2023-03-30 LAB — CBC WITH DIFFERENTIAL/PLATELET
Basophils Absolute: 0 10*3/uL (ref 0.0–0.1)
Basophils Relative: 0.7 % (ref 0.0–3.0)
Eosinophils Absolute: 0.2 10*3/uL (ref 0.0–0.7)
Eosinophils Relative: 3.6 % (ref 0.0–5.0)
HCT: 39.1 % (ref 39.0–52.0)
Hemoglobin: 12.4 g/dL — ABNORMAL LOW (ref 13.0–17.0)
Lymphocytes Relative: 43.5 % (ref 12.0–46.0)
Lymphs Abs: 2.8 10*3/uL (ref 0.7–4.0)
MCHC: 31.8 g/dL (ref 30.0–36.0)
MCV: 75.1 fl — ABNORMAL LOW (ref 78.0–100.0)
Monocytes Absolute: 0.5 10*3/uL (ref 0.1–1.0)
Monocytes Relative: 7.4 % (ref 3.0–12.0)
Neutro Abs: 2.9 10*3/uL (ref 1.4–7.7)
Neutrophils Relative %: 44.8 % (ref 43.0–77.0)
Platelets: 161 10*3/uL (ref 150.0–400.0)
RBC: 5.2 Mil/uL (ref 4.22–5.81)
RDW: 15.5 % (ref 11.5–15.5)
WBC: 6.4 10*3/uL (ref 4.0–10.5)

## 2023-03-30 LAB — HEPATIC FUNCTION PANEL
ALT: 32 U/L (ref 0–53)
AST: 24 U/L (ref 0–37)
Albumin: 4.2 g/dL (ref 3.5–5.2)
Alkaline Phosphatase: 50 U/L (ref 39–117)
Bilirubin, Direct: 0.1 mg/dL (ref 0.0–0.3)
Total Bilirubin: 0.3 mg/dL (ref 0.2–1.2)
Total Protein: 7.2 g/dL (ref 6.0–8.3)

## 2023-03-30 LAB — URINALYSIS, ROUTINE W REFLEX MICROSCOPIC
Bilirubin Urine: NEGATIVE
Hgb urine dipstick: NEGATIVE
Ketones, ur: NEGATIVE
Leukocytes,Ua: NEGATIVE
Nitrite: NEGATIVE
RBC / HPF: NONE SEEN (ref 0–?)
Specific Gravity, Urine: >=1.03 — AB (ref 1.000–1.030)
Total Protein, Urine: NEGATIVE
Urine Glucose: NEGATIVE
Urobilinogen, UA: 0.2 (ref 0.0–1.0)
pH: 6 (ref 5.0–8.0)

## 2023-03-30 LAB — TSH: TSH: 2.13 u[IU]/mL (ref 0.35–5.50)

## 2023-03-30 LAB — VITAMIN B12: Vitamin B-12: 760 pg/mL (ref 211–911)

## 2023-03-30 LAB — VITAMIN D 25 HYDROXY (VIT D DEFICIENCY, FRACTURES): VITD: 40.8 ng/mL (ref 30.00–100.00)

## 2023-03-30 LAB — TESTOSTERONE: Testosterone: 259.72 ng/dL — ABNORMAL LOW (ref 300.00–890.00)

## 2023-03-30 LAB — HEMOGLOBIN A1C: Hgb A1c MFr Bld: 6.8 % — ABNORMAL HIGH (ref 4.6–6.5)

## 2023-03-31 DIAGNOSIS — H1089 Other conjunctivitis: Secondary | ICD-10-CM | POA: Diagnosis not present

## 2023-03-31 DIAGNOSIS — S0502XA Injury of conjunctiva and corneal abrasion without foreign body, left eye, initial encounter: Secondary | ICD-10-CM | POA: Diagnosis not present

## 2023-03-31 DIAGNOSIS — X58XXXA Exposure to other specified factors, initial encounter: Secondary | ICD-10-CM | POA: Diagnosis not present

## 2023-04-01 DIAGNOSIS — H029 Unspecified disorder of eyelid: Secondary | ICD-10-CM | POA: Diagnosis not present

## 2023-04-01 DIAGNOSIS — H1089 Other conjunctivitis: Secondary | ICD-10-CM | POA: Diagnosis not present

## 2023-04-01 DIAGNOSIS — S0502XA Injury of conjunctiva and corneal abrasion without foreign body, left eye, initial encounter: Secondary | ICD-10-CM | POA: Diagnosis not present

## 2023-04-05 ENCOUNTER — Ambulatory Visit (INDEPENDENT_AMBULATORY_CARE_PROVIDER_SITE_OTHER): Payer: Medicare Other | Admitting: Internal Medicine

## 2023-04-05 ENCOUNTER — Encounter: Payer: Self-pay | Admitting: Internal Medicine

## 2023-04-05 VITALS — BP 130/82 | HR 70 | Temp 99.0°F | Ht 74.0 in | Wt 244.0 lb

## 2023-04-05 DIAGNOSIS — R040 Epistaxis: Secondary | ICD-10-CM | POA: Insufficient documentation

## 2023-04-05 DIAGNOSIS — F39 Unspecified mood [affective] disorder: Secondary | ICD-10-CM | POA: Insufficient documentation

## 2023-04-05 DIAGNOSIS — I2510 Atherosclerotic heart disease of native coronary artery without angina pectoris: Secondary | ICD-10-CM | POA: Insufficient documentation

## 2023-04-05 DIAGNOSIS — G43909 Migraine, unspecified, not intractable, without status migrainosus: Secondary | ICD-10-CM | POA: Insufficient documentation

## 2023-04-05 DIAGNOSIS — E119 Type 2 diabetes mellitus without complications: Secondary | ICD-10-CM | POA: Diagnosis not present

## 2023-04-05 DIAGNOSIS — Z0001 Encounter for general adult medical examination with abnormal findings: Secondary | ICD-10-CM

## 2023-04-05 DIAGNOSIS — L738 Other specified follicular disorders: Secondary | ICD-10-CM | POA: Insufficient documentation

## 2023-04-05 DIAGNOSIS — J324 Chronic pansinusitis: Secondary | ICD-10-CM | POA: Insufficient documentation

## 2023-04-05 DIAGNOSIS — E291 Testicular hypofunction: Secondary | ICD-10-CM | POA: Diagnosis not present

## 2023-04-05 DIAGNOSIS — F5221 Male erectile disorder: Secondary | ICD-10-CM | POA: Insufficient documentation

## 2023-04-05 DIAGNOSIS — I10 Essential (primary) hypertension: Secondary | ICD-10-CM

## 2023-04-05 DIAGNOSIS — Z5181 Encounter for therapeutic drug level monitoring: Secondary | ICD-10-CM | POA: Diagnosis not present

## 2023-04-05 DIAGNOSIS — Z7984 Long term (current) use of oral hypoglycemic drugs: Secondary | ICD-10-CM

## 2023-04-05 DIAGNOSIS — Z794 Long term (current) use of insulin: Secondary | ICD-10-CM | POA: Diagnosis not present

## 2023-04-05 DIAGNOSIS — L03213 Periorbital cellulitis: Secondary | ICD-10-CM | POA: Diagnosis not present

## 2023-04-05 DIAGNOSIS — F411 Generalized anxiety disorder: Secondary | ICD-10-CM | POA: Insufficient documentation

## 2023-04-05 DIAGNOSIS — E1140 Type 2 diabetes mellitus with diabetic neuropathy, unspecified: Secondary | ICD-10-CM | POA: Insufficient documentation

## 2023-04-05 DIAGNOSIS — Z7985 Long-term (current) use of injectable non-insulin antidiabetic drugs: Secondary | ICD-10-CM

## 2023-04-05 DIAGNOSIS — E7800 Pure hypercholesterolemia, unspecified: Secondary | ICD-10-CM

## 2023-04-05 DIAGNOSIS — R079 Chest pain, unspecified: Secondary | ICD-10-CM | POA: Insufficient documentation

## 2023-04-05 DIAGNOSIS — E538 Deficiency of other specified B group vitamins: Secondary | ICD-10-CM

## 2023-04-05 DIAGNOSIS — H029 Unspecified disorder of eyelid: Secondary | ICD-10-CM | POA: Insufficient documentation

## 2023-04-05 DIAGNOSIS — H6992 Unspecified Eustachian tube disorder, left ear: Secondary | ICD-10-CM | POA: Insufficient documentation

## 2023-04-05 DIAGNOSIS — F4320 Adjustment disorder, unspecified: Secondary | ICD-10-CM | POA: Insufficient documentation

## 2023-04-05 DIAGNOSIS — E1165 Type 2 diabetes mellitus with hyperglycemia: Secondary | ICD-10-CM | POA: Insufficient documentation

## 2023-04-05 DIAGNOSIS — H524 Presbyopia: Secondary | ICD-10-CM | POA: Insufficient documentation

## 2023-04-05 DIAGNOSIS — D3610 Benign neoplasm of peripheral nerves and autonomic nervous system, unspecified: Secondary | ICD-10-CM | POA: Insufficient documentation

## 2023-04-05 DIAGNOSIS — Z461 Encounter for fitting and adjustment of hearing aid: Secondary | ICD-10-CM | POA: Insufficient documentation

## 2023-04-05 DIAGNOSIS — Z723 Lack of physical exercise: Secondary | ICD-10-CM | POA: Insufficient documentation

## 2023-04-05 DIAGNOSIS — E559 Vitamin D deficiency, unspecified: Secondary | ICD-10-CM

## 2023-04-05 MED ORDER — ROSUVASTATIN CALCIUM 20 MG PO TABS
20.0000 mg | ORAL_TABLET | Freq: Every day | ORAL | 3 refills | Status: DC
Start: 2023-04-05 — End: 2023-10-13

## 2023-04-05 NOTE — Patient Instructions (Signed)
Ok to increase the crestor (rosuvastatin) to 20 mg per day  Please continue all other medications as before, and refills have been done if requested.  Please have the pharmacy call with any other refills you may need.  Please continue your efforts at being more active, low cholesterol diet, and weight control.  You are otherwise up to date with prevention measures today.  Please keep your appointments with your specialists as you may have planned  You will be contacted regarding the referral for: eye doctor (ophthalmology)  Your lab work was otherwise good today  Please make an Appointment to return in 6 months, or sooner if needed, also with Lab Appointment for testing done 3-5 days before at the [facility] FLO[facility] Lab (so this is for TWO appointments - please see the scheduling desk as you leave)

## 2023-04-05 NOTE — Progress Notes (Unsigned)
Patient ID: [name], male   DOB: 02/14/1956, 67 y.o.   MRN: [id number]         Chief Complaint:: wellness exam and dm, htn, hld, low vit b12 and D       HPI:  [name] is a 67 y.o. male here for wellness exam; declines covid booster and pneumovax, due for eye exam referral, o/w up to date                Also lost wt with better diet, more active. Retired. Pt denies chest pain, increased sob or doe, wheezing, orthopnea, PND, increased LE swelling, palpitations, dizziness or syncope.   Pt denies polydipsia, polyuria, or new focal neuro s/s.    Pt denies fever, wt loss, night sweats, loss of appetite, or other constitutional symptoms     Wt Readings from Last 3 Encounters:  04/05/23 244 lb (110.7 kg)  09/22/22 257 lb (116.6 kg)  02/11/22 256 lb (116.1 kg)   BP Readings from Last 3 Encounters:  04/05/23 130/82  09/22/22 126/78  02/27/22 (!) 154/90   Immunization History  Administered Date(s) Administered   PFIZER Comirnaty(Gray Top)Covid-19 Tri-Sucrose Vaccine 03/14/2021   PFIZER(Purple Top)SARS-COV-2 Vaccination 07/26/2020, 08/04/2020, 08/16/2020, 09/29/2020, 02/09/2021, 03/14/2021   Pneumococcal Conjugate-13 12/15/2015, 03/14/2016   Pneumococcal Polysaccharide-23 03/19/2015   Td 02/14/2009   Tdap 03/29/2016   Zoster Recombinat (Shingrix) 07/03/2021, 10/21/2021   Health Maintenance Due  Topic Date Due   Medicare Annual Wellness (AWV)  Never done      Past Medical History:  Diagnosis Date   ABNORMAL CHEST XRAY 2009   Biceps tendon rupture    DIABETES MELLITUS, TYPE II    ERECTILE DYSFUNCTION, MILD    Hyperlipidemia 07/14/2016   History, normal test results in 09/2018   HYPERTENSION    Hypogonadism male 03/08/2014   Hypogonadism, male    Overweight(278.02)    PSEUDOFOLLICULITIS BARBAE    PULMONARY NODULE, RIGHT UPPER LOBE 02/10/2009   MD just watching   Past Surgical History:  Procedure Laterality Date   COLONOSCOPY  11/2008   Christella Hartigan poylps   eye lid  surgey Left    NASAL POLYP SURGERY     NASAL POLYP SURGERY  1980s   WISDOM TOOTH EXTRACTION      reports that he quit smoking about 31 years ago. His smoking use included cigarettes. He has a 18.00 pack-year smoking history. He has never used smokeless tobacco. He reports that he does not drink alcohol and does not use drugs. family history includes Cancer in his brother; Diabetes in his mother; Hypertension in his mother; Kidney failure in his father. No Known Allergies Current Outpatient Medications on File Prior to Visit  Medication Sig Dispense Refill   BD INSULIN SYRINGE U/F 31G X 5/16" 0.5 ML MISC      clindamycin (CLEOCIN T) 1 % external solution APPLY TOPICALLY TO THE AFFECTED AREA TWICE DAILY 60 mL 5   Continuous Blood Gluc Receiver (FREESTYLE LIBRE 14 DAY READER) DEVI 1 Device by Does not apply route daily. Use as directed once daily E11.9 1 Device 0   Continuous Blood Gluc Sensor (FREESTYLE LIBRE 14 DAY SENSOR) MISC Apply 1 Device topically every 14 (fourteen) days. E11.9 6 each 3   dapagliflozin propanediol (FARXIGA) 5 MG TABS tablet Take 1 tablet (5 mg total) by mouth daily before breakfast. 90 tablet 3   losartan (COZAAR) 100 MG tablet Take 100 mg by mouth daily.     metFORMIN (GLUCOPHAGE) 1000 MG  tablet Take 1 tablet (1,000 mg total) by mouth 2 (two) times daily. 180 tablet 3   Semaglutide, 2 MG/DOSE, 8 MG/3ML SOPN Inject 2 mg as directed once a week. 9 mL 3   testosterone (ANDROGEL) 50 MG/5GM (1%) GEL PLACE 5 GRAMS ONTO THE SKIN DAILY 450 g 1   topiramate (TOPAMAX) 50 MG tablet 1 tablet at bedtime. 30 tablet 5   triamcinolone (NASACORT) 55 MCG/ACT AERO nasal inhaler Place 2 sprays into the nose daily. 1 each 12   amLODipine (NORVASC) 5 MG tablet Take 1 tablet (5 mg total) by mouth daily. 90 tablet 3   No current facility-administered medications on file prior to visit.        ROS:  All others reviewed and negative.  Objective        PE:  BP 130/82 (BP Location: Right Arm,  Patient Position: Sitting, Cuff Size: Normal)   Pulse 70   Temp 99 F (37.2 C) (Oral)   Ht 6\' 2"  (1.88 m)   Wt 244 lb (110.7 kg)   SpO2 98%   BMI 31.33 kg/m                 Constitutional: Pt appears in NAD               HENT: Head: NCAT.                Right Ear: External ear normal.                 Left Ear: External ear normal.                Eyes: . Pupils are equal, round, and reactive to light. Conjunctivae and EOM are normal               Nose: without d/c or deformity               Neck: Neck supple. Gross normal ROM               Cardiovascular: Normal rate and regular rhythm.                 Pulmonary/Chest: Effort normal and breath sounds without rales or wheezing.                Abd:  Soft, NT, ND, + BS, no organomegaly               Neurological: Pt is alert. At baseline orientation, motor grossly intact               Skin: Skin is warm. No rashes, no other new lesions, LE edema - none               Psychiatric: Pt behavior is normal without agitation   Micro: none  Cardiac tracings I have personally interpreted today:  none  Pertinent Radiological findings (summarize): none   Lab Results  Component Value Date   WBC 6.4 03/30/2023   HGB 12.4 (L) 03/30/2023   HCT 39.1 03/30/2023   PLT 161.0 03/30/2023   GLUCOSE 108 (H) 03/30/2023   CHOL 140 03/30/2023   TRIG 49.0 03/30/2023   HDL 44.50 03/30/2023   LDLCALC 86 03/30/2023   ALT 32 03/30/2023   AST 24 03/30/2023   NA 141 03/30/2023   K 4.1 03/30/2023   CL 106 03/30/2023   CREATININE 0.89 03/30/2023   BUN 12 03/30/2023   CO2 27 03/30/2023   TSH 2.13 03/30/2023  PSA 1.06 03/30/2023   HGBA1C 6.8 (H) 03/30/2023   MICROALBUR 1.7 03/30/2023   Assessment/Plan:  [name] is a 67 y.o. Black or African American [2] male with  has a past medical history of ABNORMAL CHEST XRAY (2009), Biceps tendon rupture, DIABETES MELLITUS, TYPE II, ERECTILE DYSFUNCTION, MILD, Hyperlipidemia (07/14/2016), HYPERTENSION,  Hypogonadism male (03/08/2014), Hypogonadism, male, Overweight(278.02), PSEUDOFOLLICULITIS BARBAE, and PULMONARY NODULE, RIGHT UPPER LOBE (02/10/2009).  Encounter for well adult exam with abnormal findings Age and sex appropriate education and counseling updated with regular exercise and diet Referrals for preventative services - refer for eye exam Immunizations addressed - declines covid booster and pneumovax Smoking counseling  - none needed Evidence for depression or other mood disorder - none significant Most recent labs reviewed. I have personally reviewed and have noted: 1) the patient's medical and social history 2) The patient's current medications and supplements 3) The patient's height, weight, and BMI have been recorded in the chart   Diabetes [facility]) Lab Results  Component Value Date   HGBA1C 6.8 (H) 03/30/2023   Stable, pt to continue current medical treatment farxiga 5 qd, metformin 1000 bid, ozempic 2 mg qwk   Essential hypertension BP Readings from Last 3 Encounters:  04/05/23 130/82  09/22/22 126/78  02/27/22 (!) 154/90   Stable, pt to continue medical treatment losartan 100 gm qd, norvasc 5 qd   Hyperlipidemia Lab Results  Component Value Date   LDLCALC 86 03/30/2023   Uncontrolled, goal ldl < 70, pt to increase crestor to 20 mg d   Vitamin B12 deficiency Lab Results  Component Value Date   VITAMINB12 760 03/30/2023   Stable, cont oral replacement - b12 1000 mcg qd   Vitamin D deficiency Last vitamin D Lab Results  Component Value Date   VD25OH 40.80 03/30/2023   Stable, cont oral replacement  Followup: Return in about 6 months (around 10/06/2023).  Oliver Barre, MD 04/06/2023 7:00 PM [facility]

## 2023-04-06 ENCOUNTER — Encounter: Payer: Self-pay | Admitting: Internal Medicine

## 2023-04-06 NOTE — Assessment & Plan Note (Signed)
Lab Results  Component Value Date   LDLCALC 86 03/30/2023   Uncontrolled, goal ldl < 70, pt to increase crestor to 20 mg d

## 2023-04-06 NOTE — Assessment & Plan Note (Signed)
BP Readings from Last 3 Encounters:  04/05/23 130/82  09/22/22 126/78  02/27/22 (!) 154/90   Stable, pt to continue medical treatment losartan 100 gm qd, norvasc 5 qd

## 2023-04-06 NOTE — Assessment & Plan Note (Signed)
Lab Results  Component Value Date   HGBA1C 6.8 (H) 03/30/2023   Stable, pt to continue current medical treatment farxiga 5 qd, metformin 1000 bid, ozempic 2 mg qwk

## 2023-04-06 NOTE — Assessment & Plan Note (Signed)
Last vitamin D Lab Results  Component Value Date   VD25OH 40.80 03/30/2023   Stable, cont oral replacement

## 2023-04-06 NOTE — Assessment & Plan Note (Signed)
Age and sex appropriate education and counseling updated with regular exercise and diet Referrals for preventative services - refer for eye exam Immunizations addressed - declines covid booster and pneumovax Smoking counseling  - none needed Evidence for depression or other mood disorder - none significant Most recent labs reviewed. I have personally reviewed and have noted: 1) the patient's medical and social history 2) The patient's current medications and supplements 3) The patient's height, weight, and BMI have been recorded in the chart

## 2023-04-06 NOTE — Assessment & Plan Note (Signed)
Lab Results  Component Value Date   VITAMINB12 760 03/30/2023   Stable, cont oral replacement - b12 1000 mcg qd

## 2023-04-18 DIAGNOSIS — H04123 Dry eye syndrome of bilateral lacrimal glands: Secondary | ICD-10-CM | POA: Diagnosis not present

## 2023-04-22 DIAGNOSIS — F4320 Adjustment disorder, unspecified: Secondary | ICD-10-CM | POA: Diagnosis not present

## 2023-04-22 DIAGNOSIS — F411 Generalized anxiety disorder: Secondary | ICD-10-CM | POA: Diagnosis not present

## 2023-04-28 DIAGNOSIS — H04123 Dry eye syndrome of bilateral lacrimal glands: Secondary | ICD-10-CM | POA: Diagnosis not present

## 2023-04-28 DIAGNOSIS — H25813 Combined forms of age-related cataract, bilateral: Secondary | ICD-10-CM | POA: Diagnosis not present

## 2023-04-28 DIAGNOSIS — H40021 Open angle with borderline findings, high risk, right eye: Secondary | ICD-10-CM | POA: Diagnosis not present

## 2023-04-28 DIAGNOSIS — E113393 Type 2 diabetes mellitus with moderate nonproliferative diabetic retinopathy without macular edema, bilateral: Secondary | ICD-10-CM | POA: Diagnosis not present

## 2023-04-28 LAB — HM DIABETES EYE EXAM

## 2023-06-01 ENCOUNTER — Ambulatory Visit (INDEPENDENT_AMBULATORY_CARE_PROVIDER_SITE_OTHER): Payer: Medicare Other

## 2023-06-01 VITALS — Ht 74.0 in | Wt 250.0 lb

## 2023-06-01 DIAGNOSIS — Z0000 Encounter for general adult medical examination without abnormal findings: Secondary | ICD-10-CM | POA: Diagnosis not present

## 2023-06-01 NOTE — Progress Notes (Signed)
Subjective:   [name] is a 67 y.o. male who presents for an Initial Medicare Annual Wellness Visit.  Visit Complete: Virtual  I connected with  [name] on 06/01/23 by a audio enabled telemedicine application and verified that I am speaking with the correct person using two identifiers.  Patient Location: Home  Provider Location: Office/Clinic  I discussed the limitations of evaluation and management by telemedicine. The patient expressed understanding and agreed to proceed.  Review of Systems     Cardiac Risk Factors include: advanced age (>30men, >28 women);diabetes mellitus;dyslipidemia;hypertension;male gender;obesity (BMI >30kg/m2);family history of premature cardiovascular disease     Objective:    Today's Vitals   06/01/23 1431  Weight: 250 lb (113.4 kg)  Height: 6\' 2"  (1.88 m)  PainSc: 0-No pain   Body mass index is 32.1 kg/m.     11/02/2021    1:55 PM 01/19/2017    3:58 AM  Advanced Directives  Does Patient Have a Medical Advance Directive? No No  Would patient like information on creating a medical advance directive?  No - Patient declined    Current Medications (verified) Outpatient Encounter Medications as of 06/01/2023  Medication Sig   amLODipine (NORVASC) 5 MG tablet Take 1 tablet (5 mg total) by mouth daily.   BD INSULIN SYRINGE U/F 31G X 5/16" 0.5 ML MISC    clindamycin (CLEOCIN T) 1 % external solution APPLY TOPICALLY TO THE AFFECTED AREA TWICE DAILY   Continuous Blood Gluc Receiver (FREESTYLE LIBRE 14 DAY READER) DEVI 1 Device by Does not apply route daily. Use as directed once daily E11.9   Continuous Blood Gluc Sensor (FREESTYLE LIBRE 14 DAY SENSOR) MISC Apply 1 Device topically every 14 (fourteen) days. E11.9   dapagliflozin propanediol (FARXIGA) 5 MG TABS tablet Take 1 tablet (5 mg total) by mouth daily before breakfast.   losartan (COZAAR) 100 MG tablet Take 100 mg by mouth daily.   metFORMIN (GLUCOPHAGE) 1000 MG tablet Take 1  tablet (1,000 mg total) by mouth 2 (two) times daily.   rosuvastatin (CRESTOR) 20 MG tablet Take 1 tablet (20 mg total) by mouth daily.   Semaglutide, 2 MG/DOSE, 8 MG/3ML SOPN Inject 2 mg as directed once a week.   testosterone (ANDROGEL) 50 MG/5GM (1%) GEL PLACE 5 GRAMS ONTO THE SKIN DAILY   topiramate (TOPAMAX) 50 MG tablet 1 tablet at bedtime.   triamcinolone (NASACORT) 55 MCG/ACT AERO nasal inhaler Place 2 sprays into the nose daily.   No facility-administered encounter medications on file as of 06/01/2023.    Allergies (verified) Patient has no known allergies.   History: Past Medical History:  Diagnosis Date   ABNORMAL CHEST XRAY 2009   Biceps tendon rupture    DIABETES MELLITUS, TYPE II    ERECTILE DYSFUNCTION, MILD    Hyperlipidemia 07/14/2016   History, normal test results in 09/2018   HYPERTENSION    Hypogonadism male 03/08/2014   Hypogonadism, male    Overweight(278.02)    PSEUDOFOLLICULITIS BARBAE    PULMONARY NODULE, RIGHT UPPER LOBE 02/10/2009   MD just watching   Past Surgical History:  Procedure Laterality Date   COLONOSCOPY  11/2008   Christella Hartigan poylps   eye lid surgey Left    NASAL POLYP SURGERY     NASAL POLYP SURGERY  1980s   WISDOM TOOTH EXTRACTION     Family History  Problem Relation Age of Onset   Hypertension Mother    Diabetes Mother    Kidney failure Father  Cancer Brother        Pancreatic   Coronary artery disease Neg Hx    Colon cancer Neg Hx    Rectal cancer Neg Hx    Stomach cancer Neg Hx    Social History   Socioeconomic History   Marital status: Married    Spouse name: Not on file   Number of children: 2   Years of education: Not on file   Highest education level: Not on file  Occupational History   Occupation: WORKS [employer]    Employer: 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 Married 7 years, single 7 years, married 13 years (1995). 2 daughters- '80, '84- 2 grandchildren-'99,  '06      Regular exercise: walk 3-4 x a week; lifting light weights   Caffeine use: none   Social Determinants of Health   Financial Resource Strain: Low Risk  (06/01/2023)   Overall Financial Resource Strain (CARDIA)    Difficulty of Paying Living Expenses: Not hard at all  Food Insecurity: No Food Insecurity (06/01/2023)   Hunger Vital Sign    Worried About Running Out of Food in the Last Year: Never true    Ran Out of Food in the Last Year: Never true  Transportation Needs: No Transportation Needs (06/01/2023)   PRAPARE - Administrator, Civil Service (Medical): No    Lack of Transportation (Non-Medical): No  Physical Activity: Sufficiently Active (06/01/2023)   Exercise Vital Sign    Days of Exercise per Week: 3 days    Minutes of Exercise per Session: 60 min  Stress: No Stress Concern Present (06/01/2023)   [employer] of Occupational Health - Occupational Stress Questionnaire    Feeling of Stress : Not at all  Social Connections: Socially Integrated (06/01/2023)   Social Connection and Isolation Panel [NHANES]    Frequency of Communication with Friends and Family: More than three times a week    Frequency of Social Gatherings with Friends and Family: More than three times a week    Attends Religious Services: More than 4 times per year    Active Member of [employer] or Organizations: Yes    Attends Engineer, structural: More than 4 times per year    Marital Status: Married    Tobacco Counseling Counseling given: Not  Answered   Clinical Intake:  Pre-visit preparation completed: Yes  Pain : No/denies pain Pain Score: 0-No pain     BMI - recorded: 32.1 Nutritional Status: BMI > 30  Obese Nutritional Risks: None Diabetes: Yes CBG done?: No Did pt. bring in CBG monitor from home?: No  How often do you need to have someone help you when you read instructions, pamphlets, or other written materials from your doctor or pharmacy?: 1 - Never What is the last grade level you completed in school?: HSG  Interpreter Needed?: No  Information entered by :: Lawton Dollinger N. Sigmond Patalano, LPN.   Activities of Daily Living    06/01/2023    2:35 PM  In your present state of health, do you have any difficulty performing the following activities:  Hearing? 1  Vision? 0  Difficulty concentrating or making decisions? 0  Walking or climbing stairs? 0  Dressing or bathing? 0  Doing errands, shopping? 0  Preparing Food and eating ? N  Using the Toilet? N  In the past six months, have you accidently leaked urine? N  Do you have problems with loss of bowel control? N  Managing your Medications? N  Managing your Finances? N  Housekeeping or managing your Housekeeping? N    Patient Care Team: Corwin Levins, MD as PCP - General (Internal Medicine)  Indicate any recent Medical Services you may have received from other than [facility] providers in the past year (date may be approximate).     Assessment:   This is a routine wellness examination for Andreaz.  Hearing/Vision screen Hearing Screening - Comments:: Patient has a slight hearing difficulties; no hearing aids.  Vision Screening - Comments:: Wears rx glasses - up to date with routine eye exams with Dr. Mateo Flow   Dietary issues and exercise activities discussed:     Goals Addressed             This Visit's Progress    My goal for 2024 is to maintain my health by staying physically active, independent and socially involved.        Depression  Screen    06/01/2023    2:35 PM 04/05/2023    2:30 PM 02/11/2022   10:55 AM 02/11/2022   10:12 AM 06/15/2021   10:39 AM 06/15/2021   10:24 AM 06/11/2020    8:30 AM  PHQ 2/9 Scores  PHQ - 2 Score 0 0 0 0 0 0 0  PHQ- 9 Score 0          Fall Risk    06/01/2023    2:34 PM 04/05/2023    2:30 PM 02/11/2022   10:54 AM 02/11/2022   10:13 AM 02/11/2022   10:12 AM  Fall Risk   Falls in the past year? 0 0 0 0 0  Number falls in past yr: 0 0 0 0 0  Injury with Fall? 0 0 0 0 0  Risk for fall due to : No Fall Risks No Fall Risks     Follow up Falls prevention discussed Falls evaluation completed       MEDICARE RISK AT HOME:  Medicare Risk at Home - 06/01/23 1433     Any stairs in or around the home? No    If so, are there any without handrails? No    Home free of loose throw rugs in walkways, pet beds, electrical cords, etc? Yes    Adequate lighting in your home to reduce risk of falls? Yes    Life alert? No    Use of a cane, walker or w/c? No    Grab bars in the bathroom? No    Shower chair or bench in shower? No    Elevated toilet seat or a handicapped toilet? No             TIMED UP AND GO:  Was the test performed? No    Cognitive Function:        06/01/2023    2:35 PM  6CIT Screen  What  Year? 0 points  What month? 0 points  What time? 0 points  Count back from 20 0 points  Months in reverse 0 points  Repeat phrase 0 points  Total Score 0 points    Immunizations Immunization History  Administered Date(s) Administered   PFIZER Comirnaty(Gray Top)Covid-19 Tri-Sucrose Vaccine 03/14/2021   PFIZER(Purple Top)SARS-COV-2 Vaccination 07/26/2020, 08/04/2020, 08/16/2020, 09/29/2020, 02/09/2021, 03/14/2021   Pneumococcal Conjugate-13 12/15/2015, 03/14/2016   Pneumococcal Polysaccharide-23 03/19/2015   Td 02/14/2009   Tdap 03/29/2016   Zoster Recombinant(Shingrix) 07/03/2021, 10/21/2021    TDAP status: Up to date  Flu Vaccine status: Declined, Education has been provided  regarding the importance of this vaccine but patient still declined. Advised may receive this vaccine at local pharmacy or Health Dept. Aware to provide a copy of the vaccination record if obtained from local pharmacy or Health Dept. Verbalized acceptance and understanding.  Pneumococcal vaccine status: Up to date  Covid-19 vaccine status: Completed vaccines  Qualifies for Shingles Vaccine? Yes   Zostavax completed No   Shingrix Completed?: Yes  Screening Tests Health Maintenance  Topic Date Due   Pneumonia Vaccine 61+ Years old (3 of 3 - PPSV23 or PCV20) 04/04/2024 (Originally 03/25/2022)   FOOT EXAM  09/23/2023   HEMOGLOBIN A1C  09/30/2023   Diabetic kidney evaluation - eGFR measurement  03/29/2024   Diabetic kidney evaluation - Urine ACR  03/29/2024   OPHTHALMOLOGY EXAM  04/27/2024   Medicare Annual Wellness (AWV)  05/31/2024   DTaP/Tdap/Td (3 - Td or Tdap) 03/29/2026   Colonoscopy  01/15/2029   Hepatitis C Screening  Completed   Zoster Vaccines- Shingrix  Completed   HPV VACCINES  Aged Out   INFLUENZA VACCINE  Discontinued   COVID-19 Vaccine  Discontinued    Health Maintenance  There are no preventive care reminders to display for this patient.   Colorectal cancer screening: Type of screening: Colonoscopy. Completed 01/15/2019. Repeat every 10 years  Lung Cancer Screening: (Low Dose CT Chest recommended if Age 71-80 years, 20 pack-year currently smoking OR have quit w/in 15years.) does not qualify.   Lung Cancer Screening Referral: no  Additional Screening:  Hepatitis C Screening: does qualify; Completed 12/15/2015  Vision Screening: Recommended annual ophthalmology exams for early detection of glaucoma and other disorders of the eye. Is the patient up to date with their annual eye exam?  Yes  Who is the provider or what is the name of the office in which the patient attends annual eye exams? [facility] If pt is not established with a provider, would they like to  be referred to a provider to establish care? No .   Dental Screening: Recommended annual dental exams for proper oral hygiene  Diabetic Foot Exam: Diabetic Foot Exam: Completed 09/22/2022  Community Resource Referral / Chronic Care Management: CRR required this visit?  No   CCM required this visit?  No    Plan:     I have personally reviewed and noted the following in the patient's chart:   Medical and social history Use of alcohol, tobacco or illicit drugs  Current medications and supplements including opioid prescriptions. Patient is not currently taking opioid prescriptions. Functional ability and status Nutritional status Physical activity Advanced directives List of other physicians Hospitalizations, surgeries, and ER visits in previous 12 months Vitals Screenings to include cognitive, depression, and falls Referrals and appointments  In addition, I have reviewed and discussed with patient certain preventive protocols, quality metrics, and best practice recommendations. A written personalized care plan  for preventive services as well as general preventive health recommendations were provided to patient.     Mickeal Needy, LPN   01/08/5620   After Visit Summary: (Mail) Due to this being a telephonic visit, the after visit summary with patients personalized plan was offered to patient via mail   Nurse Notes: Normal cognitive status assessed by direct observation via telephone conversation by this Nurse Health Advisor. No abnormalities found.

## 2023-06-01 NOTE — Patient Instructions (Addendum)
[name] , Thank you for taking time to come for your Medicare Wellness Visit. I appreciate your ongoing commitment to your health goals. Please review the following plan we discussed and let me know if I can assist you in the future.   These are the goals we discussed:  Goals      My goal for 2024 is to maintain my health by staying physically active, independent and socially involved.        This is a list of the screening recommended for you and due dates:  Health Maintenance  Topic Date Due   Pneumonia Vaccine (3 of 3 - PPSV23 or PCV20) 04/04/2024*   Complete foot exam   09/23/2023   Hemoglobin A1C  09/30/2023   Yearly kidney function blood test for diabetes  03/29/2024   Yearly kidney health urinalysis for diabetes  03/29/2024   Eye exam for diabetics  04/27/2024   Medicare Annual Wellness Visit  05/31/2024   DTaP/Tdap/Td vaccine (3 - Td or Tdap) 03/29/2026   Colon Cancer Screening  01/15/2029   Hepatitis C Screening  Completed   Zoster (Shingles) Vaccine  Completed   HPV Vaccine  Aged Out   Flu Shot  Discontinued   COVID-19 Vaccine  Discontinued  *Topic was postponed. The date shown is not the original due date.    Advanced directives: No  Conditions/risks identified: Yes  Next appointment: It was nice speaking with you today!  Please follow up in one year for your annual wellness visit via telephone call with Nurse Percell Miller on 06/05/2024 at 1:30 p.m.  If you need to cancel or reschedule please call [phone].  Preventive Care 28 Years and Older, Male  Preventive care refers to lifestyle choices and visits with your health care provider that can promote health and wellness. What does preventive care include? A yearly physical exam. This is also called an annual well check. Dental exams once or twice a year. Routine eye exams. Ask your health care provider how often you should have your eyes checked. Personal lifestyle choices, including: Daily care of your teeth  and gums. Regular physical activity. Eating a healthy diet. Avoiding tobacco and drug use. Limiting alcohol use. Practicing safe sex. Taking low doses of aspirin every day. Taking vitamin and mineral supplements as recommended by your health care provider. What happens during an annual well check? The services and screenings done by your health care provider during your annual well check will depend on your age, overall health, lifestyle risk factors, and family history of disease. Counseling  Your health care provider may ask you questions about your: Alcohol use. Tobacco use. Drug use. Emotional well-being. Home and relationship well-being. Sexual activity. Eating habits. History of falls. Memory and ability to understand (cognition). Work and work Astronomer. Screening  You may have the following tests or measurements: Height, weight, and BMI. Blood pressure. Lipid and cholesterol levels. These may be checked every 5 years, or more frequently if you are over 92 years old. Skin check. Lung cancer screening. You may have this screening every year starting at age 1 if you have a 30-pack-year history of smoking and currently smoke or have quit within the past 15 years. Fecal occult blood test (FOBT) of the stool. You may have this test every year starting at age 44. Flexible sigmoidoscopy or colonoscopy. You may have a sigmoidoscopy every 5 years or a colonoscopy every 10 years starting at age 32. Prostate cancer screening. Recommendations will vary depending on your  family history and other risks. Hepatitis C blood test. Hepatitis B blood test. Sexually transmitted disease (STD) testing. Diabetes screening. This is done by checking your blood sugar (glucose) after you have not eaten for a while (fasting). You may have this done every 1-3 years. Abdominal aortic aneurysm (AAA) screening. You may need this if you are a current or former smoker. Osteoporosis. You may be screened  starting at age 42 if you are at high risk. Talk with your health care provider about your test results, treatment options, and if necessary, the need for more tests. Vaccines  Your health care provider may recommend certain vaccines, such as: Influenza vaccine. This is recommended every year. Tetanus, diphtheria, and acellular pertussis (Tdap, Td) vaccine. You may need a Td booster every 10 years. Zoster vaccine. You may need this after age 1. Pneumococcal 13-valent conjugate (PCV13) vaccine. One dose is recommended after age 67. Pneumococcal polysaccharide (PPSV23) vaccine. One dose is recommended after age 37. Talk to your health care provider about which screenings and vaccines you need and how often you need them. This information is not intended to replace advice given to you by your health care provider. Make sure you discuss any questions you have with your health care provider. Document Released: 12/12/2015 Document Revised: 08/04/2016 Document Reviewed: 09/16/2015 Elsevier Interactive Patient Education  2017 [place] Prevention in the Home Falls can cause injuries. They can happen to people of all ages. There are many things you can do to make your home safe and to help prevent falls. What can I do on the outside of my home? Regularly fix the edges of walkways and driveways and fix any cracks. Remove anything that might make you trip as you walk through a door, such as a raised step or threshold. Trim any bushes or trees on the path to your home. Use bright outdoor lighting. Clear any walking paths of anything that might make someone trip, such as rocks or tools. Regularly check to see if handrails are loose or broken. Make sure that both sides of any steps have handrails. Any raised decks and porches should have guardrails on the edges. Have any leaves, snow, or ice cleared regularly. Use sand or salt on walking paths during winter. Clean up any spills in your garage  right away. This includes oil or grease spills. What can I do in the bathroom? Use night lights. Install grab bars by the toilet and in the tub and shower. Do not use towel bars as grab bars. Use non-skid mats or decals in the tub or shower. If you need to sit down in the shower, use a plastic, non-slip stool. Keep the floor dry. Clean up any water that spills on the floor as soon as it happens. Remove soap buildup in the tub or shower regularly. Attach bath mats securely with double-sided non-slip rug tape. Do not have throw rugs and other things on the floor that can make you trip. What can I do in the bedroom? Use night lights. Make sure that you have a light by your bed that is easy to reach. Do not use any sheets or blankets that are too big for your bed. They should not hang down onto the floor. Have a firm chair that has side arms. You can use this for support while you get dressed. Do not have throw rugs and other things on the floor that can make you trip. What can I do in the kitchen? Clean up  any spills right away. Avoid walking on wet floors. Keep items that you use a lot in easy-to-reach places. If you need to reach something above you, use a strong step stool that has a grab bar. Keep electrical cords out of the way. Do not use floor polish or wax that makes floors slippery. If you must use wax, use non-skid floor wax. Do not have throw rugs and other things on the floor that can make you trip. What can I do with my stairs? Do not leave any items on the stairs. Make sure that there are handrails on both sides of the stairs and use them. Fix handrails that are broken or loose. Make sure that handrails are as long as the stairways. Check any carpeting to make sure that it is firmly attached to the stairs. Fix any carpet that is loose or worn. Avoid having throw rugs at the top or bottom of the stairs. If you do have throw rugs, attach them to the floor with carpet tape. Make  sure that you have a light switch at the top of the stairs and the bottom of the stairs. If you do not have them, ask someone to add them for you. What else can I do to help prevent falls? Wear shoes that: Do not have high heels. Have rubber bottoms. Are comfortable and fit you well. Are closed at the toe. Do not wear sandals. If you use a stepladder: Make sure that it is fully opened. Do not climb a closed stepladder. Make sure that both sides of the stepladder are locked into place. Ask someone to hold it for you, if possible. Clearly mark and make sure that you can see: Any grab bars or handrails. First and last steps. Where the edge of each step is. Use tools that help you move around (mobility aids) if they are needed. These include: Canes. Walkers. Scooters. Crutches. Turn on the lights when you go into a dark area. Replace any light bulbs as soon as they burn out. Set up your furniture so you have a clear path. Avoid moving your furniture around. If any of your floors are uneven, fix them. If there are any pets around you, be aware of where they are. Review your medicines with your doctor. Some medicines can make you feel dizzy. This can increase your chance of falling. Ask your doctor what other things that you can do to help prevent falls. This information is not intended to replace advice given to you by your health care provider. Make sure you discuss any questions you have with your health care provider. Document Released: 09/11/2009 Document Revised: 04/22/2016 Document Reviewed: 12/20/2014 Elsevier Interactive Patient Education  2017 [employer].

## 2023-06-28 DIAGNOSIS — H40021 Open angle with borderline findings, high risk, right eye: Secondary | ICD-10-CM | POA: Diagnosis not present

## 2023-06-28 DIAGNOSIS — E113393 Type 2 diabetes mellitus with moderate nonproliferative diabetic retinopathy without macular edema, bilateral: Secondary | ICD-10-CM | POA: Diagnosis not present

## 2023-06-28 DIAGNOSIS — H04123 Dry eye syndrome of bilateral lacrimal glands: Secondary | ICD-10-CM | POA: Diagnosis not present

## 2023-06-28 DIAGNOSIS — H02881 Meibomian gland dysfunction right upper eyelid: Secondary | ICD-10-CM | POA: Diagnosis not present

## 2023-07-12 DIAGNOSIS — H04123 Dry eye syndrome of bilateral lacrimal glands: Secondary | ICD-10-CM | POA: Diagnosis not present

## 2023-07-12 DIAGNOSIS — Z135 Encounter for screening for eye and ear disorders: Secondary | ICD-10-CM | POA: Diagnosis not present

## 2023-07-12 DIAGNOSIS — E113393 Type 2 diabetes mellitus with moderate nonproliferative diabetic retinopathy without macular edema, bilateral: Secondary | ICD-10-CM | POA: Diagnosis not present

## 2023-07-12 DIAGNOSIS — H2513 Age-related nuclear cataract, bilateral: Secondary | ICD-10-CM | POA: Diagnosis not present

## 2023-07-22 DIAGNOSIS — J8410 Pulmonary fibrosis, unspecified: Secondary | ICD-10-CM | POA: Diagnosis not present

## 2023-07-22 DIAGNOSIS — U071 COVID-19: Secondary | ICD-10-CM | POA: Diagnosis not present

## 2023-07-22 DIAGNOSIS — Z0189 Encounter for other specified special examinations: Secondary | ICD-10-CM | POA: Diagnosis not present

## 2023-08-08 DIAGNOSIS — D509 Iron deficiency anemia, unspecified: Secondary | ICD-10-CM | POA: Diagnosis not present

## 2023-08-25 DIAGNOSIS — D509 Iron deficiency anemia, unspecified: Secondary | ICD-10-CM | POA: Diagnosis not present

## 2023-08-25 DIAGNOSIS — Z86010 Personal history of colonic polyps: Secondary | ICD-10-CM | POA: Diagnosis not present

## 2023-08-31 DIAGNOSIS — D649 Anemia, unspecified: Secondary | ICD-10-CM | POA: Diagnosis not present

## 2023-08-31 DIAGNOSIS — N529 Male erectile dysfunction, unspecified: Secondary | ICD-10-CM | POA: Diagnosis not present

## 2023-09-15 ENCOUNTER — Encounter: Payer: Self-pay | Admitting: Gastroenterology

## 2023-10-06 ENCOUNTER — Ambulatory Visit: Payer: Medicare Other | Admitting: Internal Medicine

## 2023-10-13 ENCOUNTER — Ambulatory Visit (INDEPENDENT_AMBULATORY_CARE_PROVIDER_SITE_OTHER): Payer: Medicare Other | Admitting: Internal Medicine

## 2023-10-13 ENCOUNTER — Encounter: Payer: Self-pay | Admitting: Internal Medicine

## 2023-10-13 VITALS — BP 142/88 | HR 69 | Temp 97.7°F | Ht 74.0 in | Wt 257.0 lb

## 2023-10-13 DIAGNOSIS — E538 Deficiency of other specified B group vitamins: Secondary | ICD-10-CM

## 2023-10-13 DIAGNOSIS — E291 Testicular hypofunction: Secondary | ICD-10-CM

## 2023-10-13 DIAGNOSIS — E559 Vitamin D deficiency, unspecified: Secondary | ICD-10-CM

## 2023-10-13 DIAGNOSIS — E7800 Pure hypercholesterolemia, unspecified: Secondary | ICD-10-CM

## 2023-10-13 DIAGNOSIS — H6123 Impacted cerumen, bilateral: Secondary | ICD-10-CM | POA: Diagnosis not present

## 2023-10-13 DIAGNOSIS — U071 COVID-19: Secondary | ICD-10-CM | POA: Insufficient documentation

## 2023-10-13 DIAGNOSIS — I10 Essential (primary) hypertension: Secondary | ICD-10-CM

## 2023-10-13 DIAGNOSIS — E1165 Type 2 diabetes mellitus with hyperglycemia: Secondary | ICD-10-CM | POA: Diagnosis not present

## 2023-10-13 DIAGNOSIS — D509 Iron deficiency anemia, unspecified: Secondary | ICD-10-CM | POA: Insufficient documentation

## 2023-10-13 DIAGNOSIS — Z7985 Long-term (current) use of injectable non-insulin antidiabetic drugs: Secondary | ICD-10-CM

## 2023-10-13 DIAGNOSIS — H04123 Dry eye syndrome of bilateral lacrimal glands: Secondary | ICD-10-CM | POA: Insufficient documentation

## 2023-10-13 LAB — LIPID PANEL
Cholesterol: 148 mg/dL (ref 0–200)
HDL: 41.1 mg/dL (ref >39.00)
LDL Cholesterol: 87 mg/dL (ref 0–99)
NonHDL: 107.2
Total CHOL/HDL Ratio: 4
Triglycerides: 101 mg/dL (ref 0.0–149.0)
VLDL: 20.2 mg/dL (ref 0.0–40.0)

## 2023-10-13 LAB — HEPATIC FUNCTION PANEL
ALT: 26 U/L (ref 0–53)
AST: 23 U/L (ref 0–37)
Albumin: 4.6 g/dL (ref 3.5–5.2)
Alkaline Phosphatase: 59 U/L (ref 39–117)
Bilirubin, Direct: 0.1 mg/dL (ref 0.0–0.3)
Total Bilirubin: 0.4 mg/dL (ref 0.2–1.2)
Total Protein: 7.8 g/dL (ref 6.0–8.3)

## 2023-10-13 LAB — BASIC METABOLIC PANEL
BUN: 11 mg/dL (ref 6–23)
CO2: 28 meq/L (ref 19–32)
Calcium: 9.7 mg/dL (ref 8.4–10.5)
Chloride: 106 meq/L (ref 96–112)
Creatinine, Ser: 0.85 mg/dL (ref 0.40–1.50)
GFR: 90.52 mL/min (ref >60.00)
Glucose, Bld: 126 mg/dL — ABNORMAL HIGH (ref 70–99)
Potassium: 4.2 meq/L (ref 3.5–5.1)
Sodium: 140 meq/L (ref 135–145)

## 2023-10-13 LAB — VITAMIN B12: Vitamin B-12: 899 pg/mL (ref 211–911)

## 2023-10-13 LAB — TESTOSTERONE: Testosterone: 180.74 ng/dL — ABNORMAL LOW (ref 300.00–890.00)

## 2023-10-13 LAB — HEMOGLOBIN A1C: Hgb A1c MFr Bld: 7.3 % — ABNORMAL HIGH (ref 4.6–6.5)

## 2023-10-13 LAB — VITAMIN D 25 HYDROXY (VIT D DEFICIENCY, FRACTURES): VITD: 49.04 ng/mL (ref 30.00–100.00)

## 2023-10-13 MED ORDER — ROSUVASTATIN CALCIUM 20 MG PO TABS: 20.0000 mg | ORAL_TABLET | Freq: Every day | ORAL | 3 refills | Status: DC

## 2023-10-13 NOTE — Patient Instructions (Signed)
Your ears were irrigated of wax today  Please take all new medication as prescribed - the crestor  Please continue all other medications as before, and refills have been done if requested.  Please have the pharmacy call with any other refills you may need.  Please continue your efforts at being more active, low cholesterol diet, and weight control.  Please keep your appointments with your specialists as you may have planned - your provider at the [facility] for possible increased ozempic  Please go to the LAB at the blood drawing area for the tests to be done  You will be contacted by phone if any changes need to be made immediately.  Otherwise, you will receive a letter about your results with an explanation, but please check with MyChart first.  Please make an Appointment to return in 6 months, or sooner if needed

## 2023-10-13 NOTE — Progress Notes (Signed)
PRE-PROCEDURE EXAM: bilateral TM cannot be visualized due to total occlusion/impaction of the ear canal.  PROCEDURE INDICATION: remove wax to visualize ear drum & relieve discomfort  CONSENT:  Verbal     PROCEDURE NOTE:     bilateral EAR:  I used warm water irrigation under direct visualization with the otoscope to free the wax bolus from the ear canal.     POST- PROCEDURE EXAM:  bilateral TMs successfully visualized and found to have no erythema     The patient tolerated the procedure well.   

## 2023-10-13 NOTE — Progress Notes (Signed)
Patient ID: [name], male   DOB: 24-Dec-1955, 67 y.o.   MRN: [id number]        Chief Complaint: follow up HTN, HLD and DM, low vit b12 and D, hypogonad       HPI:  [name] is a 67 y.o. male here overall doing ok,  Pt denies chest pain, increased sob or doe, wheezing, orthopnea, PND, increased LE swelling, palpitations, dizziness or syncope.   Pt denies fever, wt loss, night sweats, loss of appetite, or other constitutional symptoms   Pt denies fever, wt loss, night sweats, loss of appetite, or other constitutional symptoms    Gained several lbs despite the 1 mg ozempic, plans to f/u at [facility] for any dose change.   Pt states BP controlled at home.  Willing to start statin.  Has bilat ear wax impactions today Wt Readings from Last 3 Encounters:  10/13/23 257 lb (116.6 kg)  06/01/23 250 lb (113.4 kg)  04/05/23 244 lb (110.7 kg)   BP Readings from Last 3 Encounters:  10/13/23 (!) 142/88  04/05/23 130/82  09/22/22 126/78         Past Medical History:  Diagnosis Date   ABNORMAL CHEST XRAY 2009   Biceps tendon rupture    DIABETES MELLITUS, TYPE II    ERECTILE DYSFUNCTION, MILD    Hyperlipidemia 07/14/2016   History, normal test results in 09/2018   HYPERTENSION    Hypogonadism male 03/08/2014   Hypogonadism, male    Overweight(278.02)    PSEUDOFOLLICULITIS BARBAE    PULMONARY NODULE, RIGHT UPPER LOBE 02/10/2009   MD just watching   Past Surgical History:  Procedure Laterality Date   COLONOSCOPY  11/2008   Christella Hartigan poylps   eye lid surgey Left    NASAL POLYP SURGERY     NASAL POLYP SURGERY  1980s   WISDOM TOOTH EXTRACTION      reports that he quit smoking about 32 years ago. His smoking use included cigarettes. He started smoking about 50 years ago. He has a 18 pack-year smoking history. He has never used smokeless tobacco. He reports that he does not drink alcohol and does not use drugs. family history includes Cancer in his brother; Diabetes in his mother; Hypertension in  his mother; Kidney failure in his father. No Known Allergies Current Outpatient Medications on File Prior to Visit  Medication Sig Dispense Refill   BD INSULIN SYRINGE U/F 31G X 5/16" 0.5 ML MISC      carvedilol (COREG) 25 MG tablet Take 2 tablets by mouth 2 (two) times daily.     clindamycin (CLEOCIN T) 1 % external solution APPLY TOPICALLY TO THE AFFECTED AREA TWICE DAILY 60 mL 5   Continuous Blood Gluc Receiver (FREESTYLE LIBRE 14 DAY READER) DEVI 1 Device by Does not apply route daily. Use as directed once daily E11.9 1 Device 0   Continuous Blood Gluc Sensor (FREESTYLE LIBRE 14 DAY SENSOR) MISC Apply 1 Device topically every 14 (fourteen) days. E11.9 6 each 3   losartan (COZAAR) 100 MG tablet Take 100 mg by mouth daily.     metFORMIN (GLUCOPHAGE) 1000 MG tablet Take 1 tablet (1,000 mg total) by mouth 2 (two) times daily. 180 tablet 3   Semaglutide, 2 MG/DOSE, 8 MG/3ML SOPN Inject 2 mg as directed once a week. 9 mL 3   testosterone (ANDROGEL) 50 MG/5GM (1%) GEL PLACE 5 GRAMS ONTO THE SKIN DAILY 450 g 1   amLODipine (NORVASC) 5 MG tablet Take 1 tablet (  5 mg total) by mouth daily. 90 tablet 3   triamcinolone (NASACORT) 55 MCG/ACT AERO nasal inhaler Place 2 sprays into the nose daily. (Patient not taking: Reported on 10/13/2023) 1 each 12   No current facility-administered medications on file prior to visit.        ROS:  All others reviewed and negative.  Objective        PE:  BP (!) 142/88 (BP Location: Right Arm, Patient Position: Sitting, Cuff Size: Normal)   Pulse 69   Temp 97.7 F (36.5 C) (Oral)   Ht 6\' 2"  (1.88 m)   Wt 257 lb (116.6 kg)   SpO2 99%   BMI 33.00 kg/m                 Constitutional: Pt appears in NAD               HENT: Head: NCAT.                Right Ear: External ear normal.                 Left Ear: External ear normal.                Eyes: . Pupils are equal, round, and reactive to light. Conjunctivae and EOM are normal               Nose: without d/c or  deformity               Neck: Neck supple. Gross normal ROM               Cardiovascular: Normal rate and regular rhythm.                 Pulmonary/Chest: Effort normal and breath sounds without rales or wheezing.                Abd:  Soft, NT, ND, + BS, no organomegaly               Neurological: Pt is alert. At baseline orientation, motor grossly intact               Skin: Skin is warm. No rashes, no other new lesions, LE edema - none               Psychiatric: Pt behavior is normal without agitation   Micro: none  Cardiac tracings I have personally interpreted today:  none  Pertinent Radiological findings (summarize): none   Lab Results  Component Value Date   WBC 6.4 03/30/2023   HGB 12.4 (L) 03/30/2023   HCT 39.1 03/30/2023   PLT 161.0 03/30/2023   GLUCOSE 126 (H) 10/13/2023   CHOL 148 10/13/2023   TRIG 101.0 10/13/2023   HDL 41.10 10/13/2023   LDLCALC 87 10/13/2023   ALT 26 10/13/2023   AST 23 10/13/2023   NA 140 10/13/2023   K 4.2 10/13/2023   CL 106 10/13/2023   CREATININE 0.85 10/13/2023   BUN 11 10/13/2023   CO2 28 10/13/2023   TSH 2.13 03/30/2023   PSA 1.06 03/30/2023   HGBA1C 7.3 (H) 10/13/2023   MICROALBUR 1.7 03/30/2023   Assessment/Plan:  [name] is a 67 y.o. Black or African American [2] male with  has a past medical history of ABNORMAL CHEST XRAY (2009), Biceps tendon rupture, DIABETES MELLITUS, TYPE II, ERECTILE DYSFUNCTION, MILD, Hyperlipidemia (07/14/2016), HYPERTENSION, Hypogonadism male (03/08/2014), Hypogonadism, male, Overweight(278.02), PSEUDOFOLLICULITIS BARBAE, and PULMONARY NODULE, RIGHT  UPPER LOBE (02/10/2009).  Vitamin D deficiency Last vitamin D Lab Results  Component Value Date   VD25OH 49.04 10/13/2023   Stable, cont oral replacement   Vitamin B12 deficiency Lab Results  Component Value Date   VITAMINB12 899 10/13/2023   Stable, cont oral replacement - b12 1000 mcg qd   Hypogonadism male Stable, cont current  tx  Hyperlipidemia Lab Results  Component Value Date   LDLCALC 87 10/13/2023   uncontrolled, pt to start crestor 20 qd   Essential hypertension BP Readings from Last 3 Encounters:  10/13/23 (!) 142/88  04/05/23 130/82  09/22/22 126/78   Uncontrolled,, pt to continue medical treatment coreg 25 bid, losartan 100 every day, norvasc 5 qd   Diabetes (HCC) Lab Results  Component Value Date   HGBA1C 7.3 (H) 10/13/2023   Uncontroled,, pt to continue current medical treatment and plans to f/u at [facility] regarding increased ozemipc to 2 mg weekly   Bilateral impacted cerumen Resolved and hearing improved,  to f/u any worsening symptoms or concerns  Followup: Return in about 6 months (around 04/11/2024).  Oliver Barre, MD 10/15/2023 6:44 PM [facility]  Primary Care - [facility] Internal Medicine

## 2023-10-14 ENCOUNTER — Other Ambulatory Visit: Payer: Self-pay | Admitting: Internal Medicine

## 2023-10-14 MED ORDER — DAPAGLIFLOZIN PROPANEDIOL 10 MG PO TABS: 10.0000 mg | ORAL_TABLET | Freq: Every day | ORAL | 3 refills | Status: AC

## 2023-10-15 ENCOUNTER — Encounter: Payer: Self-pay | Admitting: Internal Medicine

## 2023-10-15 DIAGNOSIS — H6123 Impacted cerumen, bilateral: Secondary | ICD-10-CM | POA: Insufficient documentation

## 2023-10-15 NOTE — Assessment & Plan Note (Signed)
Lab Results  Component Value Date   LDLCALC 87 10/13/2023   uncontrolled, pt to start crestor 20 qd

## 2023-10-15 NOTE — Assessment & Plan Note (Signed)
Stable, cont current tx 

## 2023-10-15 NOTE — Assessment & Plan Note (Signed)
BP Readings from Last 3 Encounters:  10/13/23 (!) 142/88  04/05/23 130/82  09/22/22 126/78   Uncontrolled,, pt to continue medical treatment coreg 25 bid, losartan 100 every day, norvasc 5 qd

## 2023-10-15 NOTE — Assessment & Plan Note (Signed)
Lab Results  Component Value Date   VITAMINB12 899 10/13/2023   Stable, cont oral replacement - b12 1000 mcg qd

## 2023-10-15 NOTE — Assessment & Plan Note (Signed)
Lab Results  Component Value Date   HGBA1C 7.3 (H) 10/13/2023   Uncontroled,, pt to continue current medical treatment and plans to f/u at [facility] regarding increased ozemipc to 2 mg weekly

## 2023-10-15 NOTE — Assessment & Plan Note (Signed)
Last vitamin D Lab Results  Component Value Date   VD25OH 49.04 10/13/2023   Stable, cont oral replacement

## 2023-10-15 NOTE — Assessment & Plan Note (Signed)
Resolved and hearing improved,  to f/u any worsening symptoms or concerns

## 2023-10-16 ENCOUNTER — Other Ambulatory Visit: Payer: Self-pay | Admitting: Internal Medicine

## 2023-10-16 MED ORDER — ROSUVASTATIN CALCIUM 20 MG PO TABS: 20.0000 mg | ORAL_TABLET | Freq: Every day | ORAL | 3 refills | Status: AC

## 2023-12-15 DIAGNOSIS — Z794 Long term (current) use of insulin: Secondary | ICD-10-CM | POA: Diagnosis not present

## 2023-12-15 DIAGNOSIS — J330 Polyp of nasal cavity: Secondary | ICD-10-CM | POA: Diagnosis not present

## 2023-12-15 DIAGNOSIS — E785 Hyperlipidemia, unspecified: Secondary | ICD-10-CM | POA: Diagnosis not present

## 2023-12-15 DIAGNOSIS — I10 Essential (primary) hypertension: Secondary | ICD-10-CM | POA: Diagnosis not present

## 2023-12-15 DIAGNOSIS — J324 Chronic pansinusitis: Secondary | ICD-10-CM | POA: Diagnosis not present

## 2023-12-15 DIAGNOSIS — E119 Type 2 diabetes mellitus without complications: Secondary | ICD-10-CM | POA: Diagnosis not present

## 2023-12-15 DIAGNOSIS — J3489 Other specified disorders of nose and nasal sinuses: Secondary | ICD-10-CM | POA: Diagnosis not present

## 2023-12-15 DIAGNOSIS — R040 Epistaxis: Secondary | ICD-10-CM | POA: Diagnosis not present

## 2023-12-20 ENCOUNTER — Encounter: Payer: Self-pay | Admitting: Gastroenterology

## 2023-12-20 ENCOUNTER — Other Ambulatory Visit (INDEPENDENT_AMBULATORY_CARE_PROVIDER_SITE_OTHER): Payer: Medicare Other

## 2023-12-20 ENCOUNTER — Encounter: Payer: Self-pay | Admitting: Internal Medicine

## 2023-12-20 ENCOUNTER — Ambulatory Visit (INDEPENDENT_AMBULATORY_CARE_PROVIDER_SITE_OTHER): Payer: No Typology Code available for payment source | Admitting: Gastroenterology

## 2023-12-20 VITALS — BP 160/104 | HR 94 | Ht 74.0 in | Wt 259.0 lb

## 2023-12-20 DIAGNOSIS — D509 Iron deficiency anemia, unspecified: Secondary | ICD-10-CM | POA: Diagnosis not present

## 2023-12-20 DIAGNOSIS — E1165 Type 2 diabetes mellitus with hyperglycemia: Secondary | ICD-10-CM

## 2023-12-20 LAB — CBC WITH DIFFERENTIAL/PLATELET
Basophils Absolute: 0 10*3/uL (ref 0.0–0.1)
Basophils Relative: 0.3 % (ref 0.0–3.0)
Eosinophils Absolute: 0.1 10*3/uL (ref 0.0–0.7)
Eosinophils Relative: 1.6 % (ref 0.0–5.0)
HCT: 40.6 % (ref 39.0–52.0)
Hemoglobin: 12.6 g/dL — ABNORMAL LOW (ref 13.0–17.0)
Lymphocytes Relative: 35.7 % (ref 12.0–46.0)
Lymphs Abs: 2.8 10*3/uL (ref 0.7–4.0)
MCHC: 31.2 g/dL (ref 30.0–36.0)
MCV: 75.1 fL — ABNORMAL LOW (ref 78.0–100.0)
Monocytes Absolute: 0.5 10*3/uL (ref 0.1–1.0)
Monocytes Relative: 6.5 % (ref 3.0–12.0)
Neutro Abs: 4.4 10*3/uL (ref 1.4–7.7)
Neutrophils Relative %: 55.9 % (ref 43.0–77.0)
Platelets: 169 10*3/uL (ref 150.0–400.0)
RBC: 5.4 Mil/uL (ref 4.22–5.81)
RDW: 14.4 % (ref 11.5–15.5)
WBC: 7.9 10*3/uL (ref 4.0–10.5)

## 2023-12-20 LAB — B12 AND FOLATE PANEL
Folate: 8.3 ng/mL (ref >5.9)
Vitamin B-12: 690 pg/mL (ref 211–911)

## 2023-12-20 LAB — HEMOGLOBIN A1C: Hgb A1c MFr Bld: 6.1 % (ref 4.6–6.5)

## 2023-12-20 LAB — IBC + FERRITIN
Ferritin: 44.6 ng/mL (ref 22.0–322.0)
Iron: 78 ug/dL (ref 42–165)
Saturation Ratios: 19.8 % — ABNORMAL LOW (ref 20.0–50.0)
TIBC: 394.8 ug/dL (ref 250.0–450.0)
Transferrin: 282 mg/dL (ref 212.0–360.0)

## 2023-12-20 NOTE — Progress Notes (Signed)
The test results show that your current treatment is OK, as the A1c is normal.  Please continue the same plan.  There is no other need for change of treatment or further evaluation based on these results, at this time.  thanks

## 2023-12-20 NOTE — Patient Instructions (Addendum)
Your provider has requested that you go to the basement level for lab work before leaving today. Press "B" on the elevator. The lab is located at the first door on the left as you exit the elevator.  _______________________________________________________  If your blood pressure at your visit was 140/90 or greater, please contact your primary care physician to follow up on this.  _______________________________________________________  If you are age 68 or older, your body mass index should be between 23-30. Your Body mass index is 33.25 kg/m. If this is out of the aforementioned range listed, please consider follow up with your Primary Care Provider.  If you are age 18 or younger, your body mass index should be between 19-25. Your Body mass index is 33.25 kg/m. If this is out of the aformentioned range listed, please consider follow up with your Primary Care Provider.   ________________________________________________________  The [facility] providers would like to encourage you to use [facility] to communicate with providers for non-urgent requests or questions.  Due to long hold times on the telephone, sending your provider a message by [facility] may be a faster and more efficient way to get a response.  Please allow 48 business hours for a response.  Please remember that this is for non-urgent requests.  _______________________________________________________It was a pleasure to see you today!  Thank you for trusting me with your gastrointestinal care!

## 2023-12-20 NOTE — Progress Notes (Signed)
[facility] Consult Note:  History: WILGUS SWEEN 12/20/2023  Referring provider: Alexander Bergeron [facility] GI clinic  Reason for consult/chief complaint: Colon Cancer Screening (Patient states that he was referred for unknown source of blood loss)   Subjective  Prior history: Screening colonoscopy with Dr. Christella Hartigan January 2010, 4 small hyperplastic polyps removed, 10-year recall recommended Screening colonoscopy with Dr. Wendall Papa February 2020 -diminutive sigmoid hyperplastic polyp, 10-year recall recommended   Discussed the use of AI scribe software for clinical note transcription with the patient, who gave verbal consent to proceed.  History of Present Illness   The patient, a veteran with a history of heart disease and hypertension, was referred for evaluation of anemia, which was discovered last year (as best he recalls) during a routine visit at the [place]. Despite iron supplementation, the anemia persisted, prompting further investigation. The patient denies any overt gastrointestinal symptoms such as changes in bowel habits, blood in stool, nausea, vomiting, heartburn, or unanticipated weight loss.  After a thorough [facility] of [facility] and noting his medication list including isosorbide mononitrate, I asked him about any cardiac symptoms or history.  He had a complete list of his medicines through a [facility] portal, though had not noted that when medication reconciliation was done at today's visit intake with a CMA.  (It was then added to his medicine list today) on direct questioning, the patient also reports a history of chest pain, which is managed with isosorbide.  He does not have short acting nitro, but says the medication listed above is "preventative".  No cardiology visits in the [facility] health system, but he says he has seen cardiology at the [facility] and had a stress test and a heart catheterization sometime within the last 5 to 6 years.   He cannot recall the specific timing, and there does not appear to be any record of it in the clinic note from when he was [facility] [place] [facility] several [facility].  The patient is currently on iron supplements and believes they are also taking Vitamin B12 tablets. The patient has not had a colonoscopy or similar procedure in the recent past. The patient's last visit to their primary care provider was in November of the previous year.     He denies chronic abdominal pain, altered bowel habits, rectal bleeding, nausea, vomiting, dysphagia, heartburn, early satiety or weight loss.   Dozens of pages of [place] records were reviewed.  At a clinic visit September 2024, he had reportedly been referred to that GI clinic by hematology and oncology for iron deficiency anemia.  Hemoglobin at that time 12.6 with a ferritin of 75 and iron level of 87, TIBC 330 A note template section titled "occult blood in stool" lists "no", though unknown if it was ever checked ROS:  Review of Systems  Constitutional:  Negative for appetite change and unexpected weight change.  HENT:  Negative for mouth sores and voice change.   Eyes:  Negative for pain and redness.  Respiratory:  Negative for cough and shortness of breath.   Cardiovascular:  Positive for chest pain. Negative for palpitations.  Genitourinary:  Negative for dysuria and hematuria.  Musculoskeletal:  Negative for arthralgias and myalgias.  Skin:  Negative for pallor and rash.  Neurological:  Negative for weakness and headaches.  Hematological:  Negative for adenopathy.     Past Medical History: Past Medical History:  Diagnosis Date   ABNORMAL CHEST XRAY  2009   Biceps tendon rupture    DIABETES MELLITUS, TYPE II    ERECTILE DYSFUNCTION, MILD    Hyperlipidemia 07/14/2016   History, normal test results in 09/2018   HYPERTENSION    Hypogonadism male 03/08/2014   Hypogonadism, male    Overweight(278.02)    PSEUDOFOLLICULITIS BARBAE    PULMONARY  NODULE, RIGHT UPPER LOBE 02/10/2009   MD just watching     Past Surgical History: Past Surgical History:  Procedure Laterality Date   COLONOSCOPY  11/2008   Christella Hartigan poylps   eye lid surgey Left    NASAL POLYP SURGERY     NASAL POLYP SURGERY  1980s   WISDOM TOOTH EXTRACTION       Family History: Family History  Problem Relation Age of Onset   Hypertension Mother    Diabetes Mother    Kidney failure Father    Cancer Brother        Pancreatic   Coronary artery disease Neg Hx    Colon cancer Neg Hx    Rectal cancer Neg Hx    Stomach cancer Neg Hx     Social History: Social History   Socioeconomic History   Marital status: Married    Spouse name: Not on file   Number of children: 2   Years of education: Not on file   Highest education level: Not on file  Occupational History   Occupation: WORKS [employer]    Employer: 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 Married 7 years, single 7 years, married 13 years (1995). 2 daughters- '80, '84- 2 grandchildren-'99,  '06      Regular exercise: walk 3-4 x a week; lifting light weights   Caffeine use: none   Social Drivers of Corporate investment banker Strain: Low Risk  (06/07/2023)   Received from [facility]   Overall Financial Resource Strain (CARDIA)    Difficulty of Paying Living Expenses: Not hard at all  Food Insecurity: No Food Insecurity (06/07/2023)   Received from [facility]   Hunger Vital Sign     Worried About Running Out of Food in the Last Year: Never true    Ran Out of Food in the Last Year: Never true  Transportation Needs: No Transportation Needs (06/07/2023)   Received from [facility] - Transportation    Lack of Transportation (Medical): No    Lack of Transportation (Non-Medical): No  Physical Activity: Sufficiently Active (06/01/2023)   Exercise Vital Sign    Days of Exercise per Week: 3 days    Minutes of Exercise per Session: 60 min  Stress: No Stress Concern Present (06/01/2023)   [employer] of Occupational Health - Occupational Stress Questionnaire    Feeling of Stress : Not at all  Social Connections: Socially Integrated (06/01/2023)   Social Connection and Isolation Panel [NHANES]  Frequency of Communication with Friends and Family: More than three times a week    Frequency of Social Gatherings with Friends and Family: More than three times a week    Attends Religious Services: More than 4 times per year    Active Member of [employer] or Organizations: Yes    Attends Engineer, structural: More than 4 times per year    Marital Status: Married    Allergies: No Known Allergies  Outpatient Meds: Current Outpatient Medications  Medication Sig Dispense Refill   BD INSULIN SYRINGE U/F 31G X 5/16" 0.5 ML MISC      carvedilol (COREG) 25 MG tablet Take 2 tablets by mouth 2 (two) times daily.     clindamycin (CLEOCIN T) 1 % external solution APPLY TOPICALLY TO THE AFFECTED AREA TWICE DAILY 60 mL 5   Continuous Glucose Receiver (DEXCOM G6 RECEIVER) DEVI by Does not apply route.     Continuous Glucose Sensor (DEXCOM G6 SENSOR) MISC by Does not apply route as directed.     dapagliflozin propanediol (FARXIGA) 10 MG TABS tablet Take 1 tablet (10 mg total) by mouth daily before breakfast. 90 tablet 3   isosorbide mononitrate (IMDUR) 60 MG 24 hr tablet Take 60 mg by mouth daily.     losartan (COZAAR) 100 MG tablet Take 100 mg by mouth daily.     metFORMIN  (GLUCOPHAGE) 1000 MG tablet Take 1 tablet (1,000 mg total) by mouth 2 (two) times daily. 180 tablet 3   rosuvastatin (CRESTOR) 20 MG tablet Take 1 tablet (20 mg total) by mouth daily. 90 tablet 3   Semaglutide, 2 MG/DOSE, 8 MG/3ML SOPN Inject 2 mg as directed once a week. 9 mL 3   testosterone (ANDROGEL) 50 MG/5GM (1%) GEL PLACE 5 GRAMS ONTO THE SKIN DAILY 450 g 1   triamcinolone (NASACORT) 55 MCG/ACT AERO nasal inhaler Place 2 sprays into the nose daily. 1 each 12   amLODipine (NORVASC) 5 MG tablet Take 1 tablet (5 mg total) by mouth daily. 90 tablet 3   No current facility-administered medications for this visit.   [facility] med records also show he is taking isorbide mononitrate 60 mg daily, and patient confirms this    ___________________________________________________________________ Objective   Exam:  BP (!) 160/104   Pulse 94   Ht 6\' 2"  (1.88 m)   Wt 259 lb (117.5 kg)   BMI 33.25 kg/m  Wt Readings from Last 3 Encounters:  12/20/23 259 lb (117.5 kg)  10/13/23 257 lb (116.6 kg)  06/01/23 250 lb (113.4 kg)    General: Well-appearing Eyes: sclera anicteric, no redness ENT: oral mucosa moist without lesions, no cervical or supraclavicular lymphadenopathy CV: Regular without appreciable murmur, no JVD, no peripheral edema Resp: clear to auscultation bilaterally, normal RR and effort noted GI: soft, no tenderness, with active bowel sounds. No guarding or palpable organomegaly noted. Skin; warm and dry, no rash or jaundice noted Neuro: awake, alert and oriented x 3. Normal gross motor function and fluent speech   Labs:  VA data noted above  Last CBCs in the [facility] health system noted below:     Latest Ref Rng & Units 03/30/2023    8:35 AM 02/05/2022    7:57 AM 06/08/2021    1:16 PM  CBC  WBC 4.0 - 10.5 K/uL 6.4  6.2  6.8   Hemoglobin 13.0 - 17.0 g/dL 81.1  91.4  78.2   Hematocrit 39.0 - 52.0 % 39.1  40.7  39.2  Platelets 150.0 - 400.0 K/uL 161.0  138.0  152.0        Latest Ref Rng & Units 10/13/2023    2:35 PM 03/30/2023    8:35 AM 09/17/2022    8:31 AM  CMP  Glucose 70 - 99 mg/dL 629  528  413   BUN 6 - 23 mg/dL 11  12  12    Creatinine 0.40 - 1.50 mg/dL 2.44  0.10  2.72   Sodium 135 - 145 mEq/L 140  141  143   Potassium 3.5 - 5.1 mEq/L 4.2  4.1  4.2   Chloride 96 - 112 mEq/L 106  106  107   CO2 19 - 32 mEq/L 28  27  24    Calcium 8.4 - 10.5 mg/dL 9.7  9.3  9.8   Total Protein 6.0 - 8.3 g/dL 7.8  7.2  7.8   Total Bilirubin 0.2 - 1.2 mg/dL 0.4  0.3  0.3   Alkaline Phos 39 - 117 U/L 59  50  54   AST 0 - 37 U/L 23  24  26    ALT 0 - 53 U/L 26  32  33       Encounter Diagnosis  Name Primary?   Iron deficiency anemia, unspecified iron deficiency anemia type Yes   This patient seems to have a relatively mild chronic stable anemia.  Though it has been listed as iron deficiency anemia, accuracy and details of that are unknown to me at this time without hematology records from the [place].  Factor does not normalize on iron makes me wonder if he may have conditions other than or in addition to IDA contributing to this anemia.  As he reportedly has IDA, endoscopic workup to rule out a source of occult GI blood loss is warranted with EGD and colonoscopy.  However, given his now discovered cardiac history, we need some cardiology records from the [place] so our anesthesia staff can review them to determine the propriety of this patient undergoing sedation for procedures.  While his describe symptoms sound as if they may be chronic stable angina, and he does not recall having had a stent or other intervention on catheterization, we need his records to be sure. Assessment and Plan    Anemia Chronic anemia, possibly iron deficiency, identified at the [place]. Patient has been on iron supplements with no improvement. No overt GI symptoms or signs of blood loss. No recent weight loss. -Check blood counts, iron levels, B12, and folic acid levels today. -Request recent cardiology  records from the [place] in order to make risk assessment regarding procedure related sedation.  Cardiac History History of chest pain and hypertension. Currently on isosorbide 60mg  for cardiac symptoms. -Obtain recent cardiology records from the [place] to assess cardiac status and safety for potential endoscopic procedures.  When we get the appropriate records and they are reviewed by me and our anesthesia staff, we will then contact him regarding the appropriate timing and location for his endoscopic procedures.   60 minutes minutes were spent on this encounter (including chart review, history/exam, counseling/coordination of care, and documentation) > 50% of that time was spent on counseling and coordination of care.   Thank you for the courtesy of this consult.  Please call me with any questions or concerns.  Charlie Pitter III  CC: Referring provider noted above

## 2023-12-21 DIAGNOSIS — L84 Corns and callosities: Secondary | ICD-10-CM | POA: Diagnosis not present

## 2023-12-21 DIAGNOSIS — E1140 Type 2 diabetes mellitus with diabetic neuropathy, unspecified: Secondary | ICD-10-CM | POA: Diagnosis not present

## 2023-12-21 DIAGNOSIS — B351 Tinea unguium: Secondary | ICD-10-CM | POA: Diagnosis not present

## 2023-12-26 DIAGNOSIS — E291 Testicular hypofunction: Secondary | ICD-10-CM | POA: Diagnosis not present

## 2024-01-09 ENCOUNTER — Telehealth: Payer: Self-pay

## 2024-01-09 DIAGNOSIS — D509 Iron deficiency anemia, unspecified: Secondary | ICD-10-CM

## 2024-01-09 NOTE — Telephone Encounter (Signed)
 See 2/10 telephone encounter for details

## 2024-01-09 NOTE — Telephone Encounter (Signed)
 Patient did not review MyChart message with results. Reviewed lab results from 12/20/23 with patient. [facility] received and reviewed by Dr. Dominic Friendly today. Seems like patient is cleared to proceed with endoscopic procedures? I told pt that I will confirm with Dr. Dominic Friendly then give him a call to schedule. Pt verbalized understanding.

## 2024-01-09 NOTE — Telephone Encounter (Signed)
 [name] 01/09/24  3:27 PM PT called to have a nurse discuss his results. Please advise.

## 2024-01-09 NOTE — Telephone Encounter (Signed)
 PT called to have a nurse discuss his results. Please advise.

## 2024-01-10 NOTE — Telephone Encounter (Signed)
[place],  I forwarded this patient's note with my record review addendum to Cathlyn Parsons, CRNA on 01/08/23 for review to make sure he approves this patient for the [facility]. Awaiting a reply.  - H. Danis

## 2024-01-11 MED ORDER — SUFLAVE 178.7 G PO SOLR: 1.0000 | Freq: Once | ORAL | 0 refills | Status: AC

## 2024-01-11 NOTE — Telephone Encounter (Signed)
I received a message from Cathlyn Parsons, CRNA (it is attached to the addended office note that I forwarded to him), and he has cleared this patient for procedures in the [facility].  Please contact this patient and make arrangements for his EGD and colonoscopy with me in the [facility] for his iron deficiency anemia.   Stop iron 5 days before procedure (if he is still taking). Please note he is on semaglutide, which must be held for least a week prior to his procedure.  Ellwood Dense MD

## 2024-01-11 NOTE — Telephone Encounter (Signed)
Called and spoke with patient to inform him that he is cleared for endoscopic procedures in the [facility]. Patient has been scheduled for EGD/colonoscopy in the [facility] with Dr. Myrtie Neither on Friday, 02/17/24 at 1 pm. Patient has been advised that he will need a care partner. Pt is aware that he will need to hold iron 5 days prior to appt and Ozempic 1 week prior. I informed patient that medication instructions will be included in his prep instructions. Patient knows to expect a call or text from [facility] to set up shipment of his prep. Patient will make sure his appt date works, he will call me back if it does not. Pt verbalized understanding and had no concerns at the end of the call.   Ambulatory referral to GI in epic. SUFLAVE RX sent to [facility]. Instructions mailed to patient.

## 2024-01-12 DIAGNOSIS — H04123 Dry eye syndrome of bilateral lacrimal glands: Secondary | ICD-10-CM | POA: Diagnosis not present

## 2024-01-12 DIAGNOSIS — H2513 Age-related nuclear cataract, bilateral: Secondary | ICD-10-CM | POA: Diagnosis not present

## 2024-01-12 DIAGNOSIS — H527 Unspecified disorder of refraction: Secondary | ICD-10-CM | POA: Diagnosis not present

## 2024-01-12 DIAGNOSIS — E113491 Type 2 diabetes mellitus with severe nonproliferative diabetic retinopathy without macular edema, right eye: Secondary | ICD-10-CM | POA: Diagnosis not present

## 2024-01-12 DIAGNOSIS — E113392 Type 2 diabetes mellitus with moderate nonproliferative diabetic retinopathy without macular edema, left eye: Secondary | ICD-10-CM | POA: Diagnosis not present

## 2024-01-13 MED ORDER — SUFLAVE 178.7 G PO SOLR: 1.0000 | Freq: Once | ORAL | 0 refills | Status: AC

## 2024-01-13 NOTE — Telephone Encounter (Signed)
Prescription has been sent to the [place] as requested

## 2024-01-13 NOTE — Addendum Note (Signed)
Addended by: Loretha Stapler on: 01/13/2024 10:20 AM   Modules accepted: Orders

## 2024-01-13 NOTE — Telephone Encounter (Signed)
Inbound call from [place] at [facility], requesting if Moviprep or Golytely can be sent in because [facility] is not on their formulary.

## 2024-01-13 NOTE — Telephone Encounter (Signed)
Inbound call from patient, states prep medication needs to be sent to [facility] instead of [facility]. He stated he spoke to them and they stated the provider had to send it directly to the [place].

## 2024-01-13 NOTE — Telephone Encounter (Signed)
Prescription has been sent as requested.  Pt aware

## 2024-01-16 MED ORDER — GOLYTELY 236 G PO SOLR: 4000.0000 mL | Freq: Once | ORAL | 0 refills | Status: AC

## 2024-01-16 NOTE — Addendum Note (Signed)
Addended by: Marisa Sprinkles on: 01/16/2024 02:33 PM   Modules accepted: Orders

## 2024-01-16 NOTE — Telephone Encounter (Signed)
Golytely sent to [facility] pharmacy. Called and informed patient. Pt knows to expect updated instructions in the mail.

## 2024-01-18 DIAGNOSIS — H527 Unspecified disorder of refraction: Secondary | ICD-10-CM | POA: Diagnosis not present

## 2024-01-18 DIAGNOSIS — H53149 Visual discomfort, unspecified: Secondary | ICD-10-CM | POA: Diagnosis not present

## 2024-02-17 ENCOUNTER — Encounter: Payer: Self-pay | Admitting: Gastroenterology

## 2024-02-17 ENCOUNTER — Ambulatory Visit: Payer: Medicare Other | Admitting: Gastroenterology

## 2024-02-17 VITALS — BP 105/89 | HR 81 | Temp 97.9°F | Resp 14 | Ht 74.0 in | Wt 259.0 lb

## 2024-02-17 DIAGNOSIS — K648 Other hemorrhoids: Secondary | ICD-10-CM | POA: Diagnosis not present

## 2024-02-17 DIAGNOSIS — K31A11 Gastric intestinal metaplasia without dysplasia, involving the antrum: Secondary | ICD-10-CM | POA: Diagnosis not present

## 2024-02-17 DIAGNOSIS — K3189 Other diseases of stomach and duodenum: Secondary | ICD-10-CM

## 2024-02-17 DIAGNOSIS — D509 Iron deficiency anemia, unspecified: Secondary | ICD-10-CM

## 2024-02-17 DIAGNOSIS — K635 Polyp of colon: Secondary | ICD-10-CM | POA: Diagnosis present

## 2024-02-17 DIAGNOSIS — D128 Benign neoplasm of rectum: Secondary | ICD-10-CM | POA: Diagnosis not present

## 2024-02-17 DIAGNOSIS — K2950 Unspecified chronic gastritis without bleeding: Secondary | ICD-10-CM | POA: Diagnosis not present

## 2024-02-17 DIAGNOSIS — D131 Benign neoplasm of stomach: Secondary | ICD-10-CM | POA: Diagnosis not present

## 2024-02-17 DIAGNOSIS — D125 Benign neoplasm of sigmoid colon: Secondary | ICD-10-CM

## 2024-02-17 MED ORDER — SODIUM CHLORIDE 0.9 % IV SOLN: 500.0000 mL | Freq: Once | INTRAVENOUS | Status: DC

## 2024-02-17 NOTE — Progress Notes (Signed)
 A/O x 3, gd SR's, VSS, report to RN

## 2024-02-17 NOTE — Patient Instructions (Signed)
 Thank you for letting us care for your healthcare needs today! Please see handouts regarding Polyps and Hemorrhoids. Contact your GI clinic provider at the [facility] for follow-up. Contact your hematology clinic provider at the [facility] for follow-up. Bring today's reports with you to that visit. Await pathology results.   YOU HAD AN ENDOSCOPIC PROCEDURE TODAY AT THE [facility]:   Refer to the procedure report that was given to you for any specific questions about what was found during the examination.  If the procedure report does not answer your questions, please call your gastroenterologist to clarify.  If you requested that your care partner not be given the details of your procedure findings, then the procedure report has been included in a sealed envelope for you to review at your convenience later.  YOU SHOULD EXPECT: Some feelings of bloating in the abdomen. Passage of more gas than usual.  Walking can help get rid of the air that was put into your GI tract during the procedure and reduce the bloating. If you had a lower endoscopy (such as a colonoscopy or flexible sigmoidoscopy) you may notice spotting of blood in your stool or on the toilet paper. If you underwent a bowel prep for your procedure, you may not have a normal bowel movement for a few days.  Please Note:  You might notice some irritation and congestion in your nose or some drainage.  This is from the oxygen used during your procedure.  There is no need for concern and it should clear up in a day or so.  SYMPTOMS TO REPORT IMMEDIATELY:  Following lower endoscopy (colonoscopy or flexible sigmoidoscopy):  Excessive amounts of blood in the stool  Significant tenderness or worsening of abdominal pains  Swelling of the abdomen that is new, acute  Fever of 100F or higher  Following upper endoscopy (EGD)  Vomiting of blood or coffee ground material  New chest pain or pain under the shoulder blades  Painful or persistently difficult  swallowing  New shortness of breath  Fever of 100F or higher  Black, tarry-looking stools  For urgent or emergent issues, a gastroenterologist can be reached at any hour by calling [phone]. Do not use MyChart messaging for urgent concerns.    DIET:  We do recommend a small meal at first, but then you may proceed to your regular diet.  Drink plenty of fluids but you should avoid alcoholic beverages for 24 hours.  ACTIVITY:  You should plan to take it easy for the rest of today and you should NOT DRIVE or use heavy machinery until tomorrow (because of the sedation medicines used during the test).    FOLLOW UP: Our staff will call the number listed on your records the next business day following your procedure.  We will call around 7:15- 8:00 am to check on you and address any questions or concerns that you may have regarding the information given to you following your procedure. If we do not reach you, we will leave a message.     If any biopsies were taken you will be contacted by phone or by letter within the next 1-3 weeks.  Please call us at [phone] if you have not heard about the biopsies in 3 weeks.    SIGNATURES/CONFIDENTIALITY: You and/or your care partner have signed paperwork which will be entered into your electronic medical record.  These signatures attest to the fact that that the information above on your After Visit Summary has been  reviewed and is understood.  Full responsibility of the confidentiality of this discharge information lies with you and/or your care-partner.

## 2024-02-17 NOTE — Progress Notes (Signed)
 Pt's states no medical or surgical changes since previsit or office visit.

## 2024-02-17 NOTE — Op Note (Signed)
 [facility] Patient Name: [name] Procedure Date: 02/17/2024 1:34 PM MRN: [id number] Endoscopist: Sherilyn Cooter L. Myrtie Neither , MD, [id number] Age: 68 Referring MD:  Date of Birth: January 26, 1956 Gender: Male Account #: [id number] Procedure:                Colonoscopy Indications:              Unexplained iron deficiency anemia Medicines:                Monitored Anesthesia Care Procedure:                Pre-Anesthesia Assessment:                           - Prior to the procedure, a History and Physical                            was performed, and patient medications and                            allergies were reviewed. The patient's tolerance of                            previous anesthesia was also reviewed. The risks                            and benefits of the procedure and the sedation                            options and risks were discussed with the patient.                            All questions were answered, and informed consent                            was obtained. Prior Anticoagulants: The patient has                            taken no anticoagulant or antiplatelet agents. ASA                            Grade Assessment: III - A patient with severe                            systemic disease. After reviewing the risks and                            benefits, the patient was deemed in satisfactory                            condition to undergo the procedure.                           After obtaining informed consent, the colonoscope  was passed under direct vision. Throughout the                            procedure, the patient's blood pressure, pulse, and                            oxygen saturations were monitored continuously. The                            Olympus CF-[id number] ([id number]) Colonoscope was                            introduced through the anus and advanced to the the                            terminal ileum, with  identification of the                            appendiceal orifice and IC valve. Scope In: 2:15:12 PM Scope Out: 2:31:34 PM Scope Withdrawal Time: 0 hours 13 minutes 53 seconds  Total Procedure Duration: 0 hours 16 minutes 22 seconds  Findings:                 The perianal and digital rectal examinations were                            normal.                           The terminal ileum appeared normal.                           A 12 mm polyp was found in the distal sigmoid                            colon. The polyp was multi-lobulated and                            semi-sessile. The polyp was removed with a                            piecemeal technique using a cold snare. Resection                            and retrieval were complete.                           Two sessile polyps were found in the distal rectum.                            The polyps were 4 to 6 mm in size. These polyps                            were removed with a cold snare. Resection and  retrieval were complete.                           Internal hemorrhoids were found.                           The exam was otherwise without abnormality on                            direct and retroflexion views. Complications:            No immediate complications. Estimated Blood Loss:     Estimated blood loss was minimal. Impression:               - The examined portion of the ileum was normal.                           - One 12 mm polyp in the distal sigmoid colon,                            removed piecemeal using a cold snare. Resected and                            retrieved.                           - Two 4 to 6 mm polyps in the distal rectum,                            removed with a cold snare. Resected and retrieved.                           - Internal hemorrhoids.                           - The examination was otherwise normal on direct                            and retroflexion  views. Recommendation:           - Patient has a contact number available for                            emergencies. The signs and symptoms of potential                            delayed complications were discussed with the                            patient. Return to normal activities tomorrow.                            Written discharge instructions were provided to the                            patient.                           -  Resume previous diet.                           - Continue present medications.                           - Await pathology results.                           - See the other procedure note for documentation of                            additional recommendations.                           - Repeat colonoscopy is recommended. The                            colonoscopy date will be determined after pathology                            results from today's exam become available for                            review.                           - Contact your GI clinic provider at the [facility] for                            follow-up.                           Contact your hematology clinic provider at the [facility]                            for follow-up.                           Bring today's procedure reports with you to those                            visits.                           Our office will contact you with test results and                            further recommendations. Rilya Longo L. Myrtie Neither, MD 02/17/2024 2:46:36 PM This report has been signed electronically.

## 2024-02-17 NOTE — Op Note (Signed)
 [facility] Patient Name: [name] Procedure Date: 02/17/2024 1:43 PM MRN: [id number] Endoscopist: Sherilyn Cooter L. Myrtie Neither , MD, [id number] Age: 68 Referring MD:  Date of Birth: 05-26-1956 Gender: Male Account #: [id number] Procedure:                Upper GI endoscopy Indications:              Unexplained iron deficiency anemia Medicines:                Monitored Anesthesia Care Procedure:                Pre-Anesthesia Assessment:                           - Prior to the procedure, a History and Physical                            was performed, and patient medications and                            allergies were reviewed. The patient's tolerance of                            previous anesthesia was also reviewed. The risks                            and benefits of the procedure and the sedation                            options and risks were discussed with the patient.                            All questions were answered, and informed consent                            was obtained. Prior Anticoagulants: The patient has                            taken no anticoagulant or antiplatelet agents. ASA                            Grade Assessment: III - A patient with severe                            systemic disease. After reviewing the risks and                            benefits, the patient was deemed in satisfactory                            condition to undergo the procedure.                           After obtaining informed consent, the endoscope was  passed under direct vision. Throughout the                            procedure, the patient's blood pressure, pulse, and                            oxygen saturations were monitored continuously. The                            Olympus Scope SN [id number] was introduced through the                            mouth, and advanced to the second part of duodenum.                            The upper GI  endoscopy was accomplished without                            difficulty. The patient tolerated the procedure                            well. Scope In: Scope Out: Findings:                 The esophagus was normal.                           A small, somewhat friable (with lavage) sessile                            polypoid mass with no bleeding was found on the                            lesser curvature of the stomach. Approximately 2 cm                            in diameter. Biopsies were taken with a cold                            forceps for histology. (Jar 1)                           Localized mild mucosal changes characterized by                            nodularity/polypoid change were found in the                            prepyloric region of the stomach. There was patchy                            gastric mucosal atrophy in the antrum and body as                            well.  Multiple biopsies were obtained in the                            gastric body (jar 3, rule out H. pylori) and in the                            prepyloric region of the stomach (jar 2) with cold                            forceps for histology and rule out H. pylori and                            neoplasia.                           The cardia and gastric fundus were normal on                            retroflexion.                           The examined duodenum was normal. Complications:            No immediate complications. Estimated Blood Loss:     Estimated blood loss was minimal. Impression:               - Normal esophagus.                           - Gastric polypoid mass on the lesser curvature of                            the stomach. Biopsied.                           - Nodular mucosa in the prepyloric region of the                            stomach.                           - Normal examined duodenum.                           - Multiple biopsies were obtained in the gastric                             body and in the prepyloric region of the stomach. Recommendation:           - Patient has a contact number available for                            emergencies. The signs and symptoms of potential                            delayed complications were discussed with the  patient. Return to normal activities tomorrow.                            Written discharge instructions were provided to the                            patient.                           - Resume previous diet.                           - Continue present medications.                           - Await pathology results.                           - See the other procedure note for documentation of                            additional recommendations. Mitchel Delduca L. Myrtie Neither, MD 02/17/2024 2:42:36 PM This report has been signed electronically.

## 2024-02-17 NOTE — Progress Notes (Signed)
 Called to room to assist during endoscopic procedure.  Patient ID and intended procedure confirmed with present staff. Received instructions for my participation in the procedure from the performing physician.

## 2024-02-17 NOTE — Progress Notes (Signed)
 History and Physical:  This patient presents for endoscopic testing for: Encounter Diagnosis  Name Primary?   Iron deficiency anemia, unspecified iron deficiency anemia type Yes    68 year old man seen for endoscopic evaluation of iron deficiency anemia today.  Clinical details in 12/20/2023 office consult note. No clinical changes since then.  [facility] , reviewed and summarized in a chart note indicating clearance for today's procedures.  Patient is otherwise without complaints or active issues today.   Past Medical History: Past Medical History:  Diagnosis Date   ABNORMAL CHEST XRAY 2009   Biceps tendon rupture    DIABETES MELLITUS, TYPE II    ERECTILE DYSFUNCTION, MILD    Hyperlipidemia 07/14/2016   History, normal test results in 09/2018   HYPERTENSION    Hypogonadism male 03/08/2014   Hypogonadism, male    Overweight(278.02)    PSEUDOFOLLICULITIS BARBAE    PULMONARY NODULE, RIGHT UPPER LOBE 02/10/2009   MD just watching     Past Surgical History: Past Surgical History:  Procedure Laterality Date   COLONOSCOPY  11/2008   Christella Hartigan poylps   eye lid surgey Left    NASAL POLYP SURGERY     NASAL POLYP SURGERY  1980s   WISDOM TOOTH EXTRACTION      Allergies: No Known Allergies  Outpatient Meds: Current Outpatient Medications  Medication Sig Dispense Refill   amLODipine (NORVASC) 5 MG tablet Take by mouth.     ascorbic acid (VITAMIN C) 500 MG tablet Take 500 mg by mouth daily.     aspirin EC 81 MG tablet Take 81 mg by mouth daily. Swallow whole.     carboxymethylcellulose (REFRESH PLUS) 0.5 % SOLN 1 drop 3 (three) times daily as needed.     carvedilol (COREG) 25 MG tablet Take 2 tablets by mouth 2 (two) times daily.     clindamycin (CLEOCIN T) 1 % external solution APPLY TOPICALLY TO THE AFFECTED AREA TWICE DAILY 60 mL 5   Continuous Glucose Receiver (DEXCOM G6 RECEIVER) DEVI by Does not apply route.     dapagliflozin propanediol  (FARXIGA) 10 MG TABS tablet Take 1 tablet (10 mg total) by mouth daily before breakfast. 90 tablet 3   empagliflozin (JARDIANCE) 25 MG TABS tablet Take 25 mg by mouth daily.     ezetimibe (ZETIA) 10 MG tablet Take 10 mg by mouth daily.     hydrophilic ointment Apply topically as needed for dry skin.     isosorbide mononitrate (IMDUR) 60 MG 24 hr tablet Take 60 mg by mouth daily.     ketoconazole (NIZORAL) 2 % cream SMARTSIG:1 Application Topical 1 to 2 Times Daily     losartan (COZAAR) 100 MG tablet Take 100 mg by mouth daily.     metFORMIN (GLUCOPHAGE) 1000 MG tablet Take 1 tablet (1,000 mg total) by mouth 2 (two) times daily. 180 tablet 3   rosuvastatin (CRESTOR) 20 MG tablet Take 1 tablet (20 mg total) by mouth daily. 90 tablet 3   sertraline (ZOLOFT) 100 MG tablet Take by mouth.     traZODone (DESYREL) 50 MG tablet Take 50 mg by mouth at bedtime.     triamcinolone (NASACORT) 55 MCG/ACT AERO nasal inhaler Place 2 sprays into the nose daily. 1 each 12   alprostadil (EDEX) 20 MCG injection 20 mcg by Intracavitary route as needed for erectile dysfunction. use no more than 3 times per week     BD INSULIN SYRINGE U/F 31G X 5/16" 0.5 ML MISC  budesonide (PULMICORT) 0.5 MG/2ML nebulizer solution Take 0.5 mg by nebulization daily. (Patient not taking: Reported on 02/17/2024)     cetirizine (ZYRTEC) 10 MG chewable tablet Chew 10 mg by mouth daily.     ferrous sulfate 325 (65 FE) MG EC tablet Take 325 mg by mouth 3 (three) times daily with meals.     Melatonin 3 MG CAPS Take 1 capsule by mouth at bedtime.     Semaglutide, 2 MG/DOSE, 8 MG/3ML SOPN Inject 2 mg as directed once a week. 9 mL 3   testosterone (ANDROGEL) 50 MG/5GM (1%) GEL PLACE 5 GRAMS ONTO THE SKIN DAILY 450 g 1   topiramate (TOPAMAX) 25 MG capsule Take 25 mg by mouth 2 (two) times daily.     Current Facility-Administered Medications  Medication Dose Route Frequency Provider Last Rate Last Admin   0.9 %  sodium chloride infusion  500  mL Intravenous Once Sherrilyn Rist, MD          ___________________________________________________________________ Objective   Exam:  BP (!) 228/99   Pulse 67   Temp 97.9 F (36.6 C) (Temporal)   Resp 13   Ht 6\' 2"  (1.88 m)   Wt 259 lb (117.5 kg)   SpO2 98%   BMI 33.25 kg/m   CV: regular , S1/S2 Resp: clear to auscultation bilaterally, normal RR and effort noted GI: soft, no tenderness, with active bowel sounds.   Assessment: Encounter Diagnosis  Name Primary?   Iron deficiency anemia, unspecified iron deficiency anemia type Yes     Plan: Colonoscopy EGD  The benefits and risks of the planned procedure(s) were described in detail with the patient or (when appropriate) their health care proxy.  Risks were outlined as including, but not limited to, bleeding, infection, perforation, adverse medication reaction leading to cardiac or pulmonary decompensation, pancreatitis (if ERCP).  The limitation of incomplete mucosal visualization was also discussed.  No guarantees or warranties were given.  The patient is appropriate for an endoscopic procedure in the ambulatory setting.   - Amada Jupiter, MD

## 2024-02-20 ENCOUNTER — Telehealth: Payer: Self-pay | Admitting: *Deleted

## 2024-02-20 NOTE — Telephone Encounter (Signed)
  Follow up Call-     02/17/2024    1:24 PM  Call back number  Post procedure Call Back phone  # [phone]  Permission to leave phone message Yes     Patient questions:  Do you have a fever, pain , or abdominal swelling? No. Pain Score  0 *  Have you tolerated food without any problems? Yes.    Have you been able to return to your normal activities? Yes.    Do you have any questions about your discharge instructions: Diet   No. Medications  No. Follow up visit  No.  Do you have questions or concerns about your Care? No.  Actions: * If pain score is 4 or above: No action needed, pain <4.

## 2024-02-22 LAB — SURGICAL PATHOLOGY

## 2024-02-24 ENCOUNTER — Encounter: Payer: Self-pay | Admitting: Gastroenterology

## 2024-02-29 ENCOUNTER — Other Ambulatory Visit: Payer: Self-pay

## 2024-02-29 DIAGNOSIS — K3189 Other diseases of stomach and duodenum: Secondary | ICD-10-CM

## 2024-03-13 DIAGNOSIS — E291 Testicular hypofunction: Secondary | ICD-10-CM | POA: Diagnosis not present

## 2024-03-13 DIAGNOSIS — E119 Type 2 diabetes mellitus without complications: Secondary | ICD-10-CM | POA: Diagnosis not present

## 2024-03-15 DIAGNOSIS — R7989 Other specified abnormal findings of blood chemistry: Secondary | ICD-10-CM | POA: Diagnosis not present

## 2024-03-20 ENCOUNTER — Telehealth: Payer: Self-pay | Admitting: Gastroenterology

## 2024-03-20 NOTE — Telephone Encounter (Signed)
 Inbound call from patient, would like CT filed through the [place]. [facility] states [facility] that if it wasn't put under his colonoscopy he would need another authorization, patient would like to know how imaging is being presented. Does not want it to [facility], due to the coverage the [place] offers.

## 2024-03-21 ENCOUNTER — Ambulatory Visit (HOSPITAL_COMMUNITY)

## 2024-03-23 ENCOUNTER — Other Ambulatory Visit: Payer: Self-pay

## 2024-03-23 ENCOUNTER — Telehealth: Payer: Self-pay

## 2024-03-23 DIAGNOSIS — K3189 Other diseases of stomach and duodenum: Secondary | ICD-10-CM

## 2024-03-23 NOTE — Telephone Encounter (Signed)
EUS/EGD/EMR scheduled, pt instructed and medications reviewed.  Patient instructions mailed to home.  Patient to call with any questions or concerns.  

## 2024-03-23 NOTE — Telephone Encounter (Signed)
 EUS EMR set up for 05/17/24 at 1230 pm at [facility] with GM.

## 2024-03-23 NOTE — Telephone Encounter (Signed)
-----   Message from [facility] sent at 03/23/2024  6:33 AM EDT ----- Regarding: Scheduling procedure Shaunna Rosetti, I know we are waiting for authorization for the CT scan for Dr. Dominic Friendly this patient. I probably get him on the books for a procedure in June so we are pushing things out much further. This should be a 75-minute upper EUS/EMR. Thanks. GM

## 2024-03-27 ENCOUNTER — Telehealth: Payer: Self-pay

## 2024-03-27 NOTE — Telephone Encounter (Signed)
-----   Message from April M sent at 03/27/2024  2:27 PM EDT ----- Regarding: CT SCAN [name], Patient ok to have CT scheduled now. [facility] [place] [facility].  [id number] valid 02/07/24-05/07/24   Thanks.

## 2024-03-27 NOTE — Telephone Encounter (Signed)
 I have sent the order to the schedulers to set up

## 2024-04-04 NOTE — Telephone Encounter (Signed)
 April can you look into this please

## 2024-04-04 NOTE — Telephone Encounter (Signed)
 Patient requesting a call to discuss VA authorizations. States the [place] number below is for patient's procedure and not CT scan. Please advise, thank you

## 2024-04-05 ENCOUNTER — Ambulatory Visit (HOSPITAL_COMMUNITY)
Admission: RE | Admit: 2024-04-05 | Discharge: 2024-04-05 | Disposition: A | Discharge status: Home / Self Care | Source: Ambulatory Visit | Attending: Gastroenterology | Admitting: Gastroenterology

## 2024-04-05 DIAGNOSIS — N200 Calculus of kidney: Secondary | ICD-10-CM | POA: Diagnosis not present

## 2024-04-05 DIAGNOSIS — K3189 Other diseases of stomach and duodenum: Secondary | ICD-10-CM | POA: Diagnosis present

## 2024-04-05 DIAGNOSIS — Q630 Accessory kidney: Secondary | ICD-10-CM | POA: Diagnosis not present

## 2024-04-05 DIAGNOSIS — N400 Enlarged prostate without lower urinary tract symptoms: Secondary | ICD-10-CM | POA: Diagnosis not present

## 2024-04-05 DIAGNOSIS — K429 Umbilical hernia without obstruction or gangrene: Secondary | ICD-10-CM | POA: Diagnosis not present

## 2024-04-05 MED ORDER — IOHEXOL 300 MG/ML  SOLN
100.0000 mL | Freq: Once | INTRAMUSCULAR | Status: AC | PRN
Start: 2024-04-05 — End: 2024-04-05
  Administered 2024-04-05: 100 mL via INTRAVENOUS

## 2024-04-05 NOTE — Telephone Encounter (Signed)
 Thank you April The pt has been advised that the auth does cover the CT, labs and procedure

## 2024-04-05 NOTE — Telephone Encounter (Signed)
 Can we make sure? The patient is certain that it is not included.  I want to be sure we cover all bases.  Thank you

## 2024-04-10 ENCOUNTER — Ambulatory Visit: Payer: Self-pay | Admitting: Gastroenterology

## 2024-04-10 ENCOUNTER — Ambulatory Visit: Payer: Medicare Other | Admitting: Internal Medicine

## 2024-05-09 ENCOUNTER — Telehealth: Payer: Self-pay | Admitting: Gastroenterology

## 2024-05-09 NOTE — Telephone Encounter (Signed)
 Procedure:Endoscopy/EUS Procedure date: 05/17/24 Procedure location: [facility] Arrival Time: 11:00 am Spoke with the patient Y/N: Yes Any prep concerns? No  Has the patient obtained the prep from the pharmacy ? No prep needed Do you have a care partner and transportation: Yes Any additional concerns? No

## 2024-05-10 ENCOUNTER — Encounter (HOSPITAL_COMMUNITY): Payer: Self-pay | Admitting: Gastroenterology

## 2024-05-17 ENCOUNTER — Ambulatory Visit (HOSPITAL_COMMUNITY): Admitting: Anesthesiology

## 2024-05-17 ENCOUNTER — Encounter (HOSPITAL_COMMUNITY): Payer: Self-pay | Admitting: Gastroenterology

## 2024-05-17 ENCOUNTER — Other Ambulatory Visit: Payer: Self-pay

## 2024-05-17 ENCOUNTER — Ambulatory Visit (HOSPITAL_COMMUNITY)
Admission: RE | Admit: 2024-05-17 | Discharge: 2024-05-17 | Disposition: A | Discharge status: Home / Self Care | Attending: Gastroenterology | Admitting: Gastroenterology

## 2024-05-17 ENCOUNTER — Telehealth: Payer: Self-pay

## 2024-05-17 ENCOUNTER — Encounter (HOSPITAL_COMMUNITY)
Admission: RE | Disposition: A | Discharge status: Home / Self Care | Payer: Self-pay | Source: Home / Self Care | Attending: Gastroenterology

## 2024-05-17 DIAGNOSIS — Z87891 Personal history of nicotine dependence: Secondary | ICD-10-CM | POA: Insufficient documentation

## 2024-05-17 DIAGNOSIS — I10 Essential (primary) hypertension: Secondary | ICD-10-CM | POA: Insufficient documentation

## 2024-05-17 DIAGNOSIS — E119 Type 2 diabetes mellitus without complications: Secondary | ICD-10-CM | POA: Insufficient documentation

## 2024-05-17 DIAGNOSIS — I899 Noninfective disorder of lymphatic vessels and lymph nodes, unspecified: Secondary | ICD-10-CM | POA: Diagnosis not present

## 2024-05-17 DIAGNOSIS — D132 Benign neoplasm of duodenum: Secondary | ICD-10-CM

## 2024-05-17 DIAGNOSIS — I2510 Atherosclerotic heart disease of native coronary artery without angina pectoris: Secondary | ICD-10-CM | POA: Diagnosis not present

## 2024-05-17 DIAGNOSIS — D131 Benign neoplasm of stomach: Secondary | ICD-10-CM

## 2024-05-17 DIAGNOSIS — K2970 Gastritis, unspecified, without bleeding: Secondary | ICD-10-CM | POA: Diagnosis not present

## 2024-05-17 DIAGNOSIS — K449 Diaphragmatic hernia without obstruction or gangrene: Secondary | ICD-10-CM

## 2024-05-17 DIAGNOSIS — K2289 Other specified disease of esophagus: Secondary | ICD-10-CM | POA: Insufficient documentation

## 2024-05-17 DIAGNOSIS — G709 Myoneural disorder, unspecified: Secondary | ICD-10-CM | POA: Diagnosis not present

## 2024-05-17 DIAGNOSIS — Q399 Congenital malformation of esophagus, unspecified: Secondary | ICD-10-CM

## 2024-05-17 DIAGNOSIS — K3189 Other diseases of stomach and duodenum: Secondary | ICD-10-CM | POA: Diagnosis present

## 2024-05-17 DIAGNOSIS — G4730 Sleep apnea, unspecified: Secondary | ICD-10-CM | POA: Diagnosis not present

## 2024-05-17 HISTORY — PX: EUS: SHX5427

## 2024-05-17 HISTORY — PX: ESOPHAGOGASTRODUODENOSCOPY: SHX5428

## 2024-05-17 LAB — GLUCOSE, CAPILLARY: Glucose-Capillary: 158 mg/dL — ABNORMAL HIGH (ref 70–99)

## 2024-05-17 SURGERY — EGD (ESOPHAGOGASTRODUODENOSCOPY)
Anesthesia: Monitor Anesthesia Care

## 2024-05-17 MED ORDER — SODIUM CHLORIDE 0.9 % IV SOLN: INTRAVENOUS | Status: DC

## 2024-05-17 MED ORDER — CARVEDILOL 25 MG PO TABS
25.0000 mg | ORAL_TABLET | Freq: Once | ORAL | Status: AC
  Administered 2024-05-17: 25 mg via ORAL
  Filled 2024-05-17: qty 1

## 2024-05-17 MED ORDER — OMEPRAZOLE 40 MG PO CPDR: 40.0000 mg | DELAYED_RELEASE_CAPSULE | Freq: Every day | ORAL | 12 refills | Status: AC

## 2024-05-17 MED ORDER — PROPOFOL 1000 MG/100ML IV EMUL
INTRAVENOUS | Status: AC
Start: 2024-05-17 — End: 2024-05-17
  Filled 2024-05-17: qty 100

## 2024-05-17 MED ORDER — LIDOCAINE 2% (20 MG/ML) 5 ML SYRINGE
INTRAMUSCULAR | Status: DC | PRN
  Administered 2024-05-17: 100 mg via INTRAVENOUS

## 2024-05-17 MED ORDER — PROPOFOL 500 MG/50ML IV EMUL
INTRAVENOUS | Status: DC | PRN
  Administered 2024-05-17: 140 ug/kg/min via INTRAVENOUS
  Administered 2024-05-17: 20 mg via INTRAVENOUS

## 2024-05-17 NOTE — Op Note (Signed)
 [facility] Patient Name: [name] Procedure Date: 05/17/2024 MRN: [id number] Attending MD: Yong Henle , MD, [id number] Date of Birth: Mar 31, 1956 CSN: [id number] Age: 68 Admit Type: Outpatient Procedure:                Upper EUS Indications:              Gastric mucosal mass/polyp found on endoscopy,                            Gastric polyps, For therapy of gastric polyps                            (adenoma with concern for focal high-grade                            dysplasia) Providers:                Yong Henle, MD, Bradley Caffey, Marvelyn Slim, Technician, Clayton Curd. Alday CRNA, CRNA Referring MD:              Medicines:                Monitored Anesthesia Care Complications:            No immediate complications. Estimated Blood Loss:     Estimated blood loss was minimal. Procedure:                Pre-Anesthesia Assessment:                           - Prior to the procedure, a History and Physical                            was performed, and patient medications and                            allergies were reviewed. The patient's tolerance of                            previous anesthesia was also reviewed. The risks                            and benefits of the procedure and the sedation                            options and risks were discussed with the patient.                            All questions were answered, and informed consent                            was obtained. Prior Anticoagulants: The patient has  taken no anticoagulant or antiplatelet agents                            except for aspirin . ASA Grade Assessment: III - A                            patient with severe systemic disease. After                            reviewing the risks and benefits, the patient was                            deemed in satisfactory condition to undergo the                             procedure.                           After obtaining informed consent, the endoscope was                            passed under direct vision. Throughout the                            procedure, the patient's blood pressure, pulse, and                            oxygen saturations were monitored continuously. The                            [id number]-[id number] ([id number]) Olympus endoscope was introduced                            through the mouth, and advanced to the second part                            of duodenum. The [id number]-[id number] ([id number]) Olympus                            duodenoscope was introduced through the mouth, and                            advanced to the second part of duodenum. The                            [id number]-[id number] ([id number]) Olympus radial ultrasound                            scope was introduced through the mouth, and                            advanced to the stomach for ultrasound examination.  The upper EUS was accomplished without difficulty.                            The patient tolerated the procedure. Scope In: Scope Out: Findings:      ENDOSCOPIC FINDING: :      The distal esophagus was mildly tortuous.      No other gross mucosal lesions were noted in the entire esophagus.      The Z-line was irregular and was found 40 cm from the incisors.      A 1 cm hiatal hernia was present.      Patchy moderate inflammation characterized by erosions, erythema and       granularity was found in the entire examined stomach. Previously       biopsied for HP so not redone.      A single 30 mm semi-sessile polypoid lesion with no bleeding and no       stigmata of recent bleeding was found on the lesser curvature extending       to the incisura. The majority of the lesion has JNET2a texture, but in       one area of decreased vascularity found centrally, biopsies were taken       with a cold forceps for histology to rule out more significant dysplasia.       No gross lesions were noted in the duodenal bulb, in the first portion       of the duodenum and in the second portion of the duodenum.      ENDOSONOGRAPHIC FINDING: :      A hypoechoic sessile polypoid lesion was identified endosonographically       in the lesser curve of the stomach extending to the incisura of the       stomach. The lesion measured 21 mm by 8 mm in maximal cross-sectional       diameter. The outer margins were irregular. The majority of the lesion       is within layers 1 and 2 however, there were multiple areas of       sonographic evidence suggesting invasion into the submucosa (Layer 3).       The muscularis propria was intact.      Endosonographic imaging in the rest of the cardia of the stomach, in the       greater curve of the stomach and in the antrum of the stomach showed no       extrinsic compression, intramural (subepithelial) lesion, mass, varices       or wall thickening.      No malignant-appearing lymph nodes were visualized in the left gastric       region (level 17), gastrohepatic ligament (level 18), celiac region       (level 20) and perigastric region.      Endosonographic imaging in the visualized portion of the liver showed no       mass.      The celiac region was visualized. Impression:               EGD impression:                           - Tortuous esophagus distally. No gross mucosal  lesions in the entire esophagus otherwise. Z-line                            irregular, 40 cm from the incisors.                           - 1 cm hiatal hernia.                           - Gastritis throughout. Previously biopsied and                            negative for HP, so not redone.                           - A single gastric polypoid lesion on lesser curve                            extending to incisura (JNET2a mostly but area of                            vascular loss centrally was noted as well) -                             biopsied.                           - No gross lesions in the duodenal bulb, in the                            first portion of the duodenum and in the second                            portion of the duodenum.                           EUS impression:                           - A sessile polypoid lesion was found in the lesser                            curve of the stomach extending onto the incisura of                            the stomach. A tissue diagnosis was obtained prior                            to this exam. This is consistent with an adenoma.                            Although no overt evidence of malignancy, as the  lesion seems to enter into the submucosa but does                            not break through into the muscularis propria.                           - Rest the visualized stomach was unremarkable on                            EUS.                           - No malignant-appearing lymph nodes were                            visualized in the left gastric region (level 17),                            gastrohepatic ligament (level 18), celiac region                            (level 20) and perigastric region. Moderate Sedation:      Not Applicable - Patient had care per Anesthesia. Recommendation:           - The patient will be observed post-procedure,                            until all discharge criteria are met.                           - Discharge patient to home.                           - Patient has a contact number available for                            emergencies. The signs and symptoms of potential                            delayed complications were discussed with the                            patient. Return to normal activities tomorrow.                            Written discharge instructions were provided to the                            patient.                           - Resume previous  diet.                           - Observe patient's clinical course.                           -  Await path results.                           - Will discuss with patient and family and primary                            GI, this patient would best be served with attempt                            at an endoscopic submucosal dissection attempt (if                            unsuccessful then piecemeal EMR could be pursued).                            I think in patient's best interest, as I do not                            perform ESD at this time, I would like to refer to                            [facility] or [facility] or [facility].                           - The findings and recommendations were discussed                            with the patient.                           - The findings and recommendations were discussed                            with the patient's family. Procedure Code(s):        --- Professional ---                           [phone], Esophagogastroduodenoscopy, flexible,                            transoral; with endoscopic ultrasound examination                            limited to the esophagus, stomach or duodenum, and                            adjacent structures                           43239, Esophagogastroduodenoscopy, flexible,                            transoral; with biopsy, single or multiple Diagnosis Code(s):        --- Professional ---  Q39.9, Congenital malformation of esophagus,                            unspecified                           K22.89, Other specified disease of esophagus                           K44.9, Diaphragmatic hernia without obstruction or                            gangrene                           K29.70, Gastritis, unspecified, without bleeding                           K31.7, Polyp of stomach and duodenum                           K31.89, Other diseases of stomach and duodenum                            I89.9, Noninfective disorder of lymphatic vessels                            and lymph nodes, unspecified CPT copyright 2022 American Medical Association. All rights reserved. The codes documented in this report are preliminary and upon coder review may  be revised to meet current compliance requirements. Yong Henle, MD 05/17/2024 1:58:37 PM Number of Addenda: 0

## 2024-05-17 NOTE — Anesthesia Preprocedure Evaluation (Addendum)
 Anesthesia Evaluation  Patient identified by MRN, date of birth, ID band Patient awake    Reviewed: Allergy & Precautions, NPO status , Patient's Chart, lab work & pertinent test results, reviewed documented beta blocker date and time   Airway Mallampati: II  TM Distance: >3 FB Neck ROM: Full    Dental  (+) Dental Advisory Given, Edentulous Lower, Edentulous Upper   Pulmonary sleep apnea , former smoker   Pulmonary exam normal breath sounds clear to auscultation       Cardiovascular hypertension, Pt. on medications and Pt. on home beta blockers + CAD  Normal cardiovascular exam Rhythm:Regular Rate:Normal     Neuro/Psych  Headaches PSYCHIATRIC DISORDERS Anxiety      Neuromuscular disease    GI/Hepatic Neg liver ROS,,,Gastric mass   Endo/Other  diabetes, Type 2, Oral Hypoglycemic Agents, Insulin  Dependent    Renal/GU negative Renal ROS     Musculoskeletal negative musculoskeletal ROS (+)    Abdominal   Peds  Hematology negative hematology ROS (+)   Anesthesia Other Findings Day of surgery medications reviewed with the patient.  Reproductive/Obstetrics                              Anesthesia Physical Anesthesia Plan  ASA: 3  Anesthesia Plan: MAC   Post-op Pain Management: Minimal or no pain anticipated   Induction: Intravenous  PONV Risk Score and Plan: 1 and TIVA and Treatment may vary due to age or medical condition  Airway Management Planned: Simple Face Mask and Natural Airway  Additional Equipment:   Intra-op Plan:   Post-operative Plan:   Informed Consent: I have reviewed the patients History and Physical, chart, labs and discussed the procedure including the risks, benefits and alternatives for the proposed anesthesia with the patient or authorized representative who has indicated his/her understanding and acceptance.     Dental advisory given  Plan Discussed with:  CRNA  Anesthesia Plan Comments:          Anesthesia Quick Evaluation

## 2024-05-17 NOTE — Discharge Instructions (Signed)

## 2024-05-17 NOTE — H&P (Signed)
 GASTROENTEROLOGY PROCEDURE H&P NOTE   Primary Care Physician: Roslyn Coombe, MD  HPI: [name] is a 68 y.o. male who presents for EGD for evaluation of gastric adenoma for attempt at resection.  Past Medical History:  Diagnosis Date   ABNORMAL CHEST XRAY 2009   Biceps tendon rupture    DIABETES MELLITUS, TYPE II    ERECTILE DYSFUNCTION, MILD    Hyperlipidemia 07/14/2016   History, normal test results in 09/2018   HYPERTENSION    Hypogonadism male 03/08/2014   Hypogonadism, male    Overweight(278.02)    PSEUDOFOLLICULITIS BARBAE    PULMONARY NODULE, RIGHT UPPER LOBE 02/10/2009   MD just watching   Past Surgical History:  Procedure Laterality Date   COLONOSCOPY  11/2008   Howard Macho poylps   eye lid surgey Left    NASAL POLYP SURGERY     NASAL POLYP SURGERY  1980s   WISDOM TOOTH EXTRACTION     Current Facility-Administered Medications  Medication Dose Route Frequency Provider Last Rate Last Admin   0.9 %  sodium chloride  infusion   Intravenous Continuous Mansouraty, Albino Alu., MD        Current Facility-Administered Medications:    0.9 %  sodium chloride  infusion, , Intravenous, Continuous, Mansouraty, Albino Alu., MD No Known Allergies Family History  Problem Relation Age of Onset   Hypertension Mother    Diabetes Mother    Kidney failure Father    Cancer Brother        Pancreatic   Coronary artery disease Neg Hx    Colon cancer Neg Hx    Rectal cancer Neg Hx    Stomach cancer Neg Hx    Social History   Socioeconomic History   Marital status: Married    Spouse name: Not on file   Number of children: 2   Years of education: Not on file   Highest education level: Not on file  Occupational History   Occupation: WORKS [employer]    Employer: 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 Married 7 years, single 7 years, married 13 years (1995). 2 daughters- '80, '84- 2 grandchildren-'99,  '06      Regular exercise: walk 3-4 x a week; lifting light weights   Caffeine use: none   Social Drivers of Corporate investment banker Strain: Low Risk  (06/07/2023)   Received from [facility]   Overall Financial Resource Strain (CARDIA)    Difficulty of Paying Living Expenses: Not hard at all  Food Insecurity: No Food Insecurity (06/07/2023)   Received from [facility]   Hunger Vital Sign    Within the past 12 months, you worried that your food would run out before you got the money to buy more.: Never true    Within the past 12 months, the food you bought just didn't last  and you didn't have money to get more.: Never true  Transportation Needs: No Transportation Needs (06/07/2023)   Received from [facility]   PRAPARE - Transportation    Lack of Transportation (Medical): No    Lack of Transportation (Non-Medical): No  Physical Activity: Sufficiently Active (06/01/2023)   Exercise Vital Sign    Days of Exercise per Week: 3 days    Minutes of Exercise per Session: 60 min  Stress: No Stress Concern Present (06/01/2023)   [employer] of Occupational Health - Occupational Stress Questionnaire    Feeling of Stress : Not at all  Social Connections: Socially Integrated (06/01/2023)   Social Connection and Isolation Panel    Frequency of Communication with Friends and Family: More than three times a week    Frequency of Social Gatherings with Friends and Family: More than three times a week    Attends Religious Services: More than 4 times per  year    Active Member of [employer] or Organizations: Yes    Attends Banker Meetings: More than 4 times per year    Marital Status: Married  Catering manager Violence: Not At Risk (06/01/2023)   Humiliation, Afraid, Rape, and Kick questionnaire    Fear of Current or Ex-Partner: No    Emotionally Abused: No    Physically Abused: No    Sexually Abused: No    Physical Exam: Today's Vitals   05/17/24 1127  BP: (!) 188/76  Resp: 12  Temp: (!) 97.2 F (36.2 C)  TempSrc: Temporal  SpO2: 99%  Weight: 113.4 kg  Height: 6' 2 (1.88 m)  PainSc: 0-No pain   Body mass index is 32.1 kg/m. GEN: NAD EYE: Sclerae anicteric ENT: MMM CV: Non-tachycardic GI: Soft, NT/ND NEURO:  Alert & Oriented x 3  Lab Results: No results for input(s): WBC, HGB, HCT, PLT in the last 72 hours. BMET No results for input(s): NA, K, CL, CO2, GLUCOSE, BUN, CREATININE, CALCIUM  in the last 72 hours. LFT No results for input(s): PROT, ALBUMIN, AST, ALT, ALKPHOS, BILITOT, BILIDIR, IBILI in the last 72 hours. PT/INR No results for input(s): LABPROT, INR in the last 72 hours.   Impression / Plan: This is a 68 y.o.male who presents for EGD for evaluation of gastric adenoma for attempt at resection.  Based upon the description and endoscopic pictures I do feel that it is reasonable to pursue an Advanced Polypectomy attempt of the polyp/lesion.  We discussed some of the techniques of advanced polypectomy which include Endoscopic Mucosal Resection, OVESCO Full-Thickness Resection, Endorotor Morcellation, and Tissue Ablation via Fulguration.  We also reviewed images of typical techniques as noted above.  The risks and benefits of endoscopic evaluation were discussed with the patient; these include but are not limited to the risk of perforation, infection, bleeding, missed lesions, lack of diagnosis, severe illness requiring hospitalization, as well as anesthesia and  sedation related illnesses.  During attempts at advanced resection, the risks of bleeding and perforation/leak are increased as opposed to diagnostic and screening procedures, and that was discussed with the patient as well.   In addition, I explained that with the possible need for piecemeal resection, subsequent short-interval endoscopic evaluation for follow up and potential retreatment of the lesion/area may be necessary.  If, after attempt at removal of the polyp/lesion, it is found that the patient has a complication or that an invasive lesion or malignant lesion is found, or that the polyp/lesion continues to recur, the patient is aware and understands that  surgery may still be indicated/required.  All patient questions were answered, to the best of my ability, and the patient agrees to the aforementioned plan of action with follow-up as indicated.   The risks and benefits of endoscopic evaluation/treatment were discussed with the patient and/or family; these include but are not limited to the risk of perforation, infection, bleeding, missed lesions, lack of diagnosis, severe illness requiring hospitalization, as well as anesthesia and sedation related illnesses.  The patient's history has been reviewed, patient examined, no change in status, and deemed stable for procedure.  The patient and/or family is agreeable to proceed.    Yong Henle, MD [facility] Advanced Endoscopy Office # [phone]

## 2024-05-17 NOTE — Transfer of Care (Signed)
 Immediate Anesthesia Transfer of Care Note  Patient: [name]  Procedure(s) Performed: EGD (ESOPHAGOGASTRODUODENOSCOPY) ULTRASOUND, UPPER GI TRACT, ENDOSCOPIC  Patient Location: PACU  Anesthesia Type:MAC  Level of Consciousness: sedated  Airway & Oxygen Therapy: Patient Spontanous Breathing and Patient connected to face mask oxygen  Post-op Assessment: Report given to RN and Post -op Vital signs reviewed and stable  Post vital signs: Reviewed and stable  Last Vitals:  Vitals Value Taken Time  BP 109/55 05/17/24 13:40  Temp    Pulse 66 05/17/24 13:40  Resp 12 05/17/24 13:40  SpO2 100 % 05/17/24 13:40  Vitals shown include unfiled device data.  Last Pain:  Vitals:   05/17/24 1127  TempSrc: Temporal  PainSc: 0-No pain         Complications: No notable events documented.

## 2024-05-17 NOTE — Telephone Encounter (Signed)
-----   Message from [facility] sent at 05/17/2024  3:31 PM EDT ----- Regarding: Followup [name], This patient underwent EGD/EUS with me today. He has a lesion that is already involving the submucosa. He will benefit from consideration of endoscopic submucosal dissection of gastric mass/adenoma. Please place referral to [facility] advanced endoscopy (Dr. Spaete/Dr. Dufault/Dr. Amanda Jungling). Let me know when referral has been placed, I will send a message to my colleagues to be aware that there is a referral pending scheduling. Thanks. GM  FYI HD, Best to attempt ESD if possible rather than piecemeal EMR (though the location of the lesion is going to be challenging for sure). Thanks. GM

## 2024-05-17 NOTE — Anesthesia Postprocedure Evaluation (Signed)
 Anesthesia Post Note  Patient: Irma Mans  Procedure(s) Performed: EGD (ESOPHAGOGASTRODUODENOSCOPY) ULTRASOUND, UPPER GI TRACT, ENDOSCOPIC     Patient location during evaluation: Endoscopy Anesthesia Type: MAC Level of consciousness: oriented, awake and alert and awake Pain management: pain level controlled Vital Signs Assessment: post-procedure vital signs reviewed and stable Respiratory status: spontaneous breathing, nonlabored ventilation, respiratory function stable and patient connected to nasal cannula oxygen Cardiovascular status: blood pressure returned to baseline and stable Postop Assessment: no headache, no backache and no apparent nausea or vomiting Anesthetic complications: no   No notable events documented.  Last Vitals:  Vitals:   05/17/24 1400 05/17/24 1410  BP: (!) 139/91 (!) 163/98  Pulse: 72 65  Resp: 15 14  Temp:    SpO2: 97% 98%    Last Pain:  Vitals:   05/17/24 1410  TempSrc:   PainSc: 0-No pain                 Erin Havers

## 2024-05-18 LAB — SURGICAL PATHOLOGY

## 2024-05-18 NOTE — Telephone Encounter (Signed)
Thank you Patty. GM

## 2024-05-18 NOTE — Telephone Encounter (Signed)
 [facility] referral has been made with records faxed

## 2024-05-19 ENCOUNTER — Ambulatory Visit: Payer: Self-pay | Admitting: Gastroenterology

## 2024-05-19 ENCOUNTER — Encounter (HOSPITAL_COMMUNITY): Payer: Self-pay | Admitting: Gastroenterology

## 2024-05-21 NOTE — Telephone Encounter (Signed)
 PT Is calling in reference to the referral to [facility]. He said [facility] and says that we need to contact them to have them approve it. Please advise.

## 2024-05-21 NOTE — Telephone Encounter (Signed)
 I have faxed a community care form to the [place] for the referral to [facility]. Pt advised

## 2024-06-05 ENCOUNTER — Ambulatory Visit: Payer: Medicare Other

## 2024-06-08 ENCOUNTER — Telehealth: Payer: Self-pay | Admitting: Gastroenterology

## 2024-06-08 NOTE — Telephone Encounter (Signed)
 PT is calling to get results of colonoscopy on 6/19. Please advise.

## 2024-06-08 NOTE — Telephone Encounter (Signed)
 The pt has been given the path letter information and he has an appt with [facility] next week.     AUSENCIO VADEN         [birth year]2024 [address] [place] [address]          Dear Mr. Mathey,  I am writing to inform you that the pathology taken during your recent endoscopic examination showed:   FINAL MICROSCOPIC DIAGNOSIS:  A. STOMACH POLYPECTOMY:  -  Gastric adenoma, intestinal type, negative for high-grade dysplasia.    What does this all mean? The gastric polypoid lesion still remains present in your stomach.  The pathology still returned as just precancerous adenoma.  This means that it has a chance to develop into cancer over time if it is not removed.  As we discussed after your EUS, it is felt that an attempt via an endoscopic submucosal dissection would be most ideal for you to see about removing this in 1 piece if possible rather than in pieces (though that may still be required).  You have been referred to [facility] advanced endoscopy for of trying to have that removed.  We have communicated with them and they should be reaching out to you in the next 1 to 2 weeks about scheduling a appointment to discuss the procedure and to get scheduled for the procedure itself.  If by the end of the month you have not heard of an appointment, please reach out to our office.   Please call us  at [phone] if you have persistent problems or have questions about your condition that have not been fully answered at this time.  Sincerely,

## 2024-06-14 DIAGNOSIS — K317 Polyp of stomach and duodenum: Secondary | ICD-10-CM | POA: Diagnosis not present

## 2024-06-18 ENCOUNTER — Telehealth: Payer: Self-pay | Admitting: Internal Medicine

## 2024-06-18 NOTE — Telephone Encounter (Signed)
 Contacted [name] to schedule their annual wellness visit. Patient declined to schedule AWV at this time.Patient has transferred his care to the [place].    [facility] Care Guide [facility] AWV TEAM Direct Dial : [phone]

## 2024-09-13 ENCOUNTER — Encounter: Admitting: Internal Medicine

## 2024-09-13 ENCOUNTER — Ambulatory Visit

## 2024-09-26 ENCOUNTER — Telehealth: Payer: Self-pay

## 2024-09-26 NOTE — Telephone Encounter (Signed)
 Patient was identified as falling into the [place] Measure - Diabetes.   Patient was: Patient is not currently using our practice.

## 2024-10-02 ENCOUNTER — Encounter (INDEPENDENT_AMBULATORY_CARE_PROVIDER_SITE_OTHER): Payer: Self-pay | Admitting: Otolaryngology

## 2024-10-02 ENCOUNTER — Ambulatory Visit (INDEPENDENT_AMBULATORY_CARE_PROVIDER_SITE_OTHER): Admitting: Otolaryngology

## 2024-10-02 VITALS — BP 168/94 | HR 105 | Ht 74.0 in | Wt 245.0 lb

## 2024-10-02 DIAGNOSIS — J383 Other diseases of vocal cords: Secondary | ICD-10-CM | POA: Diagnosis not present

## 2024-10-02 DIAGNOSIS — E041 Nontoxic single thyroid nodule: Secondary | ICD-10-CM | POA: Diagnosis not present

## 2024-10-02 NOTE — Patient Instructions (Signed)
 I have ordered an imaging study for you to complete prior to your next visit. Please call [facility] Scheduling at [phone] to schedule your imaging if you have not received a call within 24 hours. If you are unable to complete your imaging study prior to your next scheduled visit please call our office to let us  know.

## 2024-10-02 NOTE — Progress Notes (Signed)
 Dear Dr. Kenney, Here is my assessment for our mutual patient, [name]. Thank you for allowing me the opportunity to care for your patient. Please do not hesitate to contact me should you have any other questions. Sincerely, Dr. Eldora Blanch  Otolaryngology Clinic Note Referring provider: Dr. Kenney HPI:  [name] is a 68 y.o. male kindly referred by Dr. Kenney for evaluation of neck swelling  Initial visit (09/2024): He noted incidentally about a month ago that he had some neck swelling anteriorly suddenly; no pain or discomfort. Noted incidentally. He subsequently had a US  and then a CT scan through the [place] with findings as below. He can still note the mass. Patient otherwise denies: - dysphagia, odynophagia, aspiration episodes or PNA,unintentional weight loss - changes in voice, shortness of breath, hemoptysis - ear pain, neck masses Denies thyroid  issues including compressive symptoms, no issues with hypo or hyperthyroid symptoms; no head or neck radiation or FHX of carcinoma of thyroid    H&N Surgery: Nasal polypectomy (1980s) Personal or FHx of bleeding dz or anesthesia difficulty: no  AP/AC: ASA 81 GLP-1: yes  Tobacco: prior, quit  PMHx: HTN, CAD, Atherosclerosis, Migraines, OSA, DM  Independent Review of Additional Tests or Records:  [name] (09/04/2024) referral notes: area inferio rto thyroid  and cricoid that is indurated, increasing; referred to ER; fever, headache, trismus, difficulty swallowing, dyspnea, chest pain, palpitations, abdominal pain, nausea, vomiting; noted mild swelling, no fluctuance; EGD weeks ago?; 2 Dx: Neck mass; Rx; outpatient US , ref to ENT EGD/intervention Dr. Elta 08/10/2024:  Findings: The esophagus was normal. A single 40 mm sessile polyp with no bleeding and no  stigmata of recent bleeding was found on the lesser  curvature of the stomach. Preparations were made for  endoscopic submucosal dissection. Demarcation of the  lesion  was performed with high-definition white light  to clearly identify the boundaries of the lesion.  EndoClot SIS was injected with adequate lift of the  lesion from the muscularis propria. A circumferential  incision around the lesion into the submucosa was  performed with a hybrid flexible tip knife. The lesion  was then dissected from the underlying deep layers  with the electrocautery knife and retrieved with a  Roth net. A 50 mm area was resected. Resection and  retrieval were complete. Resected tissue margins were  examined and clear of polyp tissue. There was no  bleeding at the end of the procedure. The decision was  made to use sutures to close a defect after  polypectomy. The endoscope was removed, the OverStitch  device was attached to the proximal and distal ends of  the endoscope. The scope was reinserted. The device  was loaded with 2.0 polypropylene suture. A total of  two sutures in a running fashion were placed with  cinches on both ends. Adequate tissue approximation  was noted. There was no bleeding at the end of the  procedure.   [place] 08/03/2024): WBC 8.4; TSH: 03/30/2023: 2.13  CT Neck (09/04/2024): independently interpreted - cannot see worrisome LAD but this seems a bit too low for thyroglossal duct cyst classically;    Thyroid  US :  PMH/Meds/All/SocHx/FamHx/ROS:   Past Medical History:  Diagnosis Date   ABNORMAL CHEST XRAY 2009   Biceps tendon rupture    DIABETES MELLITUS, TYPE II    ERECTILE DYSFUNCTION, MILD    Hyperlipidemia 07/14/2016   History, normal test results in 09/2018   HYPERTENSION    Hypogonadism male 03/08/2014   Hypogonadism, male    Overweight(278.02)  PSEUDOFOLLICULITIS BARBAE    PULMONARY NODULE, RIGHT UPPER LOBE 02/10/2009   MD just watching     Past Surgical History:  Procedure Laterality Date   COLONOSCOPY  11/2008   Teressa poylps   ESOPHAGOGASTRODUODENOSCOPY N/A 05/17/2024   Procedure: EGD (ESOPHAGOGASTRODUODENOSCOPY);   Surgeon: Wilhelmenia Aloha Raddle., MD;  Location: [facility];  Service: Gastroenterology;  Laterality: N/A;   EUS N/A 05/17/2024   Procedure: ULTRASOUND, UPPER GI TRACT, ENDOSCOPIC;  Surgeon: Wilhelmenia Aloha Raddle., MD;  Location: [facility];  Service: Gastroenterology;  Laterality: N/A;   eye lid surgey Left    NASAL POLYP SURGERY     NASAL POLYP SURGERY  1980s   WISDOM TOOTH EXTRACTION      Family History  Problem Relation Age of Onset   Hypertension Mother    Diabetes Mother    Kidney failure Father    Cancer Brother        Pancreatic   Coronary artery disease Neg Hx    Colon cancer Neg Hx    Rectal cancer Neg Hx    Stomach cancer Neg Hx      Social Connections: Socially Integrated (06/01/2023)   Social Connection and Isolation Panel    Frequency of Communication with Friends and Family: More than three times a week    Frequency of Social Gatherings with Friends and Family: More than three times a week    Attends Religious Services: More than 4 times per year    Active Member of Clubs or Organizations: Yes    Attends Engineer, Structural: More than 4 times per year    Marital Status: Married      Current Outpatient Medications:    alprostadil (EDEX) 20 MCG injection, 20 mcg by Intracavitary route as needed for erectile dysfunction. use no more than 3 times per week, Disp: , Rfl:    amLODipine  (NORVASC ) 5 MG tablet, Take by mouth., Disp: , Rfl:    ascorbic acid (VITAMIN C) 500 MG tablet, Take 500 mg by mouth daily., Disp: , Rfl:    aspirin  EC 81 MG tablet, Take 81 mg by mouth daily. Swallow whole., Disp: , Rfl:    BD INSULIN  SYRINGE U/F 31G X 5/16 0.5 ML MISC, , Disp: , Rfl:    carboxymethylcellulose (REFRESH PLUS) 0.5 % SOLN, 1 drop 3 (three) times daily as needed., Disp: , Rfl:    carvedilol  (COREG ) 25 MG tablet, Take 2 tablets by mouth 2 (two) times daily., Disp: , Rfl:    cetirizine (ZYRTEC) 10 MG chewable tablet, Chew 10 mg by mouth daily., Disp: , Rfl:     clindamycin  (CLEOCIN  T) 1 % external solution, APPLY TOPICALLY TO THE AFFECTED AREA TWICE DAILY, Disp: 60 mL, Rfl: 5   Continuous Glucose Receiver (DEXCOM G6 RECEIVER) DEVI, by Does not apply route., Disp: , Rfl:    dapagliflozin  propanediol (FARXIGA ) 10 MG TABS tablet, Take 1 tablet (10 mg total) by mouth daily before breakfast., Disp: 90 tablet, Rfl: 3   empagliflozin  (JARDIANCE ) 25 MG TABS tablet, Take 25 mg by mouth daily., Disp: , Rfl:    ezetimibe (ZETIA) 10 MG tablet, Take 10 mg by mouth daily., Disp: , Rfl:    ferrous sulfate 325 (65 FE) MG EC tablet, Take 325 mg by mouth 3 (three) times daily with meals., Disp: , Rfl:    hydrophilic ointment, Apply topically as needed for dry skin., Disp: , Rfl:    isosorbide mononitrate (IMDUR) 60 MG 24 hr tablet, Take 60 mg by mouth  daily., Disp: , Rfl:    ketoconazole (NIZORAL) 2 % cream, SMARTSIG:1 Application Topical 1 to 2 Times Daily, Disp: , Rfl:    losartan (COZAAR) 100 MG tablet, Take 100 mg by mouth daily., Disp: , Rfl:    Melatonin 3 MG CAPS, Take 1 capsule by mouth at bedtime., Disp: , Rfl:    metFORMIN  (GLUCOPHAGE ) 1000 MG tablet, Take 1 tablet (1,000 mg total) by mouth 2 (two) times daily., Disp: 180 tablet, Rfl: 3   omeprazole  (PRILOSEC) 40 MG capsule, Take 1 capsule (40 mg total) by mouth daily., Disp: 30 capsule, Rfl: 12   rosuvastatin  (CRESTOR ) 20 MG tablet, Take 1 tablet (20 mg total) by mouth daily., Disp: 90 tablet, Rfl: 3   Semaglutide , 2 MG/DOSE, 8 MG/3ML SOPN, Inject 2 mg as directed once a week., Disp: 9 mL, Rfl: 3   sertraline (ZOLOFT) 100 MG tablet, Take by mouth., Disp: , Rfl:    testosterone  (ANDROGEL ) 50 MG/5GM (1%) GEL, PLACE 5 GRAMS ONTO THE SKIN DAILY, Disp: 450 g, Rfl: 1   topiramate  (TOPAMAX ) 25 MG capsule, Take 25 mg by mouth 2 (two) times daily., Disp: , Rfl:    traZODone (DESYREL) 50 MG tablet, Take 50 mg by mouth at bedtime., Disp: , Rfl:    triamcinolone  (NASACORT ) 55 MCG/ACT AERO nasal inhaler, Place 2 sprays into  the nose daily., Disp: 1 each, Rfl: 12   budesonide (PULMICORT) 0.5 MG/2ML nebulizer solution, Take 0.5 mg by nebulization daily. (Patient not taking: Reported on 10/02/2024), Disp: , Rfl:    Physical Exam:   BP (!) 168/94 (BP Location: Right Arm, Patient Position: Sitting, Cuff Size: Large)   Pulse (!) 105   Ht 6' 2 (1.88 m)   Wt 245 lb (111.1 kg)   SpO2 98%   BMI 31.46 kg/m   Salient findings:  CN II-XII intact Bilateral EAC clear and TM intact with well pneumatized middle ear spaces Anterior rhinoscopy: Septum intact; bilateral inferior turbinates without significant hypertrophy No lesions of oral cavity/oropharynx No obviously palpable neck masses/lymphadenopathy/thyromegaly -- except very soft isthmus nodule(?), moves easily with swallowing; high innominate No respiratory distress or stridor; voice quality class 2, mild harshness; TFL was indicated to better evaluate the proximal airway, given the patient's history and exam findings, and is detailed below.  Seprately Identifiable Procedures:  Prior to initiating any procedures, risks/benefits/alternatives were explained to the patient and verbal consent obtained. Procedure Note Pre-procedure diagnosis: Thyroid  nodule, assess vocal fold function Post-procedure diagnosis: Same Procedure: Transnasal Fiberoptic Laryngoscopy, CPT 31575 - Mod 25 Indication: see above Complications: None apparent EBL: 0 mL  The procedure was undertaken to further evaluate the patient's complaint above, with mirror exam inadequate for appropriate examination due to gag reflex and poor patient tolerance  Procedure:  Patient was identified as correct patient. Verbal consent was obtained. The nose was sprayed with oxymetazoline  and 4% lidocaine . The The flexible laryngoscope was passed through the nose to view the nasal cavity, pharynx (oropharynx, hypopharynx) and larynx.  The larynx was examined at rest and during multiple phonatory tasks. Documentation  was obtained and reviewed with patient. The scope was removed. The patient tolerated the procedure well.  Findings: The nasal cavity and nasopharynx did not reveal any masses or lesions, mucosa appeared to be without obvious lesions. The tongue base, pharyngeal walls, piriform sinuses, vallecula, epiglottis and postcricoid region are normal in appearance. The visualized portion of the subglottis and proximal trachea is widely patent. The vocal folds are mobile bilaterally. There are no  lesions on the free edge of the vocal folds nor elsewhere in the larynx worrisome for malignancy.    Electronically signed by: Eldora KATHEE Blanch, MD 10/02/2024 1:50 PM   Impression & Plans:  [name] is a 68 y.o. male with th  1. Thyroid  nodule    Does not look like there is significant stranding and he is not reporting infectious symptoms at the time of nodule discovery. Seems more related to thyroid  parenchyma itself so wonder if this is more a thyroid  nodule rather than thyroglossal duct cyst. We discussed options -- observation, FNA, removal; high innominate  He opted for FNA --- advise not to aspirate entire nodule in case needs to be excised. F/u 6 weeks with FNA  See below regarding exact medications prescribed this encounter including dosages and route: No orders of the defined types were placed in this encounter.     Thank you for allowing me the opportunity to care for your patient. Please do not hesitate to contact me should you have any other questions.  Sincerely, Eldora Blanch, MD Otolaryngologist (ENT), [facility] Phone: [phone] Fax: [phone]  10/02/2024, 1:50 PM   I have personally spent 45 minutes involved in face-to-face and non-face-to-face activities for this patient on the day of the visit.  Professional time spent excludes any procedures performed but includes the following activities, in addition to those noted in the documentation: preparing to see the  patient (review of outside documentation and results), performing a medically appropriate examination, counseling, documenting in the electronic health record, independently interpreting results (US , CT).

## 2024-10-11 ENCOUNTER — Telehealth (INDEPENDENT_AMBULATORY_CARE_PROVIDER_SITE_OTHER): Payer: Self-pay

## 2024-10-11 NOTE — Telephone Encounter (Signed)
 See encounter

## 2024-10-22 ENCOUNTER — Telehealth (INDEPENDENT_AMBULATORY_CARE_PROVIDER_SITE_OTHER): Payer: Self-pay

## 2024-10-22 DIAGNOSIS — E041 Nontoxic single thyroid nodule: Secondary | ICD-10-CM

## 2024-10-22 NOTE — Telephone Encounter (Signed)
 Re-ordered FNA

## 2024-10-24 ENCOUNTER — Telehealth (INDEPENDENT_AMBULATORY_CARE_PROVIDER_SITE_OTHER): Payer: Self-pay | Admitting: Otolaryngology

## 2024-10-24 DIAGNOSIS — E041 Nontoxic single thyroid nodule: Secondary | ICD-10-CM

## 2024-10-24 NOTE — Telephone Encounter (Signed)
 Put In IR referral

## 2024-10-29 ENCOUNTER — Other Ambulatory Visit (INDEPENDENT_AMBULATORY_CARE_PROVIDER_SITE_OTHER): Payer: Self-pay | Admitting: Otolaryngology

## 2024-10-29 DIAGNOSIS — E041 Nontoxic single thyroid nodule: Secondary | ICD-10-CM

## 2024-11-08 ENCOUNTER — Telehealth (INDEPENDENT_AMBULATORY_CARE_PROVIDER_SITE_OTHER): Payer: Self-pay

## 2024-11-08 NOTE — Telephone Encounter (Signed)
 Spoke to patient and informed him that Dr. Tobie would recommend he come in for the FNA Biopsy. Offered 11/15/24 at 4:00pm per provider and patient declined the appt. Patient asked if provider was qualified to do the procedure since he initially referred him out and I told him he is qualified. Patient stated that he would think about it and call back if he wanted to schedule appt with Dr. Tobie.

## 2024-11-08 NOTE — Telephone Encounter (Signed)
 Called patient to let him know Dr. Tobie has put the order in for the FNA Biopsy but the offices that patient has called to do the FNA biopsy do not accept [facility]. I explained to patient if the [facility] or himself could find a place that accepts his insurance we could sent the order there, but we have no way to know if the doctors office accepts [facility] insurance or not. Patient got upset and stated he was going to call the [place] and get a referral someplace else because it has been over a month since he has been seen. Patient stated this is an urgent matter to him because he is afraid if he does not get this taken care of it is going to kill him. I explained to the patient that we are doing everything that we can to get it done and again apologized for this and that the hold up was finding a doctors office that accepts [employer]. Patient understood thanked me for taking the call and asked that his appointments with Dr. Tobie be canceled, until he can talk to the [facility] to see what they want to do for next steps.

## 2024-11-08 NOTE — Telephone Encounter (Signed)
 Patient called stating that were Dr. Tobie sent the order for the FNA Biopsy the [facility] won't cover it. The [facility] to Dr. Tobie. He needs to also cancel and reschedule his follow up with Dr. Tobie until he is able to get his FNA biopsy. Patient would like a call back

## 2024-11-15 ENCOUNTER — Ambulatory Visit (INDEPENDENT_AMBULATORY_CARE_PROVIDER_SITE_OTHER): Admitting: Otolaryngology

## 2024-12-14 ENCOUNTER — Encounter: Payer: Self-pay | Admitting: *Deleted

## 2024-12-14 NOTE — Progress Notes (Signed)
 [name]                                          MRN: [id number]   12/14/2024   The [facility] Quality Team Specialist reviewed this patient medical record for the purposes of chart review for care gap closure. The following were reviewed: chart review for care gap closure-kidney health evaluation for diabetes:eGFR  and uACR.    [facility] Quality Team

## 2024-12-18 NOTE — Progress Notes (Signed)
 ABDULRAHMAN BRACEY                                          MRN: [id number]   12/18/2024   The [facility] Quality Team Specialist reviewed this patient medical record for the purposes of chart review for care gap closure. The following were reviewed: chart review for care gap closure-controlling blood pressure.    [facility] Quality Team
